# Patient Record
Sex: Male | Born: 1950 | Race: White | Hispanic: No | Smoking: Current every day smoker
Health system: Southern US, Community
[De-identification: ages and names within clinical notes are randomized; demographics above are authoritative.]

## PROBLEM LIST (undated history)

## (undated) DIAGNOSIS — C801 Malignant (primary) neoplasm, unspecified: Secondary | ICD-10-CM

## (undated) HISTORY — PX: OTHER SURGICAL HISTORY: SHX169

## (undated) HISTORY — PX: APPENDECTOMY: SHX54

---

## 2001-09-20 ENCOUNTER — Emergency Department (HOSPITAL_COMMUNITY): Admission: EM | Admit: 2001-09-20 | Discharge: 2001-09-20 | Payer: Self-pay | Admitting: Emergency Medicine

## 2010-12-08 ENCOUNTER — Other Ambulatory Visit: Payer: Self-pay | Admitting: Internal Medicine

## 2010-12-10 ENCOUNTER — Other Ambulatory Visit: Payer: Self-pay | Admitting: Internal Medicine

## 2010-12-10 ENCOUNTER — Ambulatory Visit
Admission: RE | Admit: 2010-12-10 | Discharge: 2010-12-10 | Disposition: A | Discharge status: Home / Self Care | Payer: 59 | Source: Ambulatory Visit | Attending: Internal Medicine | Admitting: Internal Medicine

## 2010-12-10 MED ORDER — IOHEXOL 300 MG/ML  SOLN
100.0000 mL | Freq: Once | INTRAMUSCULAR | Status: AC | PRN
  Administered 2010-12-10: 100 mL via INTRAVENOUS

## 2010-12-16 ENCOUNTER — Other Ambulatory Visit (HOSPITAL_COMMUNITY): Payer: Self-pay | Admitting: Internal Medicine

## 2010-12-27 ENCOUNTER — Other Ambulatory Visit: Payer: Self-pay | Admitting: Radiology

## 2010-12-28 ENCOUNTER — Telehealth: Payer: Self-pay | Admitting: Oncology

## 2010-12-28 ENCOUNTER — Ambulatory Visit (HOSPITAL_COMMUNITY)
Admission: RE | Admit: 2010-12-28 | Discharge: 2010-12-28 | Disposition: A | Discharge status: Home / Self Care | Payer: 59 | Source: Ambulatory Visit | Attending: Internal Medicine | Admitting: Internal Medicine

## 2010-12-28 DIAGNOSIS — C801 Malignant (primary) neoplasm, unspecified: Secondary | ICD-10-CM | POA: Insufficient documentation

## 2010-12-28 DIAGNOSIS — R0989 Other specified symptoms and signs involving the circulatory and respiratory systems: Secondary | ICD-10-CM | POA: Insufficient documentation

## 2010-12-28 DIAGNOSIS — Z01812 Encounter for preprocedural laboratory examination: Secondary | ICD-10-CM | POA: Insufficient documentation

## 2010-12-28 DIAGNOSIS — R0602 Shortness of breath: Secondary | ICD-10-CM | POA: Insufficient documentation

## 2010-12-28 DIAGNOSIS — R0609 Other forms of dyspnea: Secondary | ICD-10-CM | POA: Insufficient documentation

## 2010-12-28 DIAGNOSIS — C786 Secondary malignant neoplasm of retroperitoneum and peritoneum: Secondary | ICD-10-CM | POA: Insufficient documentation

## 2010-12-28 DIAGNOSIS — R599 Enlarged lymph nodes, unspecified: Secondary | ICD-10-CM | POA: Insufficient documentation

## 2010-12-28 LAB — CBC
HCT: 43.5 % (ref 39.0–52.0)
Hemoglobin: 14.7 g/dL (ref 13.0–17.0)
MCH: 31.7 pg (ref 26.0–34.0)
MCHC: 33.8 g/dL (ref 30.0–36.0)
RBC: 4.64 MIL/uL (ref 4.22–5.81)

## 2010-12-28 LAB — PROTIME-INR: Prothrombin Time: 13.7 seconds (ref 11.6–15.2)

## 2010-12-28 MED ORDER — MIDAZOLAM HCL 5 MG/5ML IJ SOLN
INTRAMUSCULAR | Status: AC | PRN
  Administered 2010-12-28: 2 mg via INTRAVENOUS

## 2010-12-28 MED ORDER — MIDAZOLAM HCL 2 MG/2ML IJ SOLN
INTRAMUSCULAR | Status: AC
  Filled 2010-12-28: qty 4

## 2010-12-28 MED ORDER — FENTANYL CITRATE 0.05 MG/ML IJ SOLN
INTRAMUSCULAR | Status: AC
  Filled 2010-12-28: qty 4

## 2010-12-28 MED ORDER — SODIUM CHLORIDE 0.9 % IV SOLN
INTRAVENOUS | Status: DC
  Administered 2010-12-28: 20 mL/h via INTRAVENOUS

## 2010-12-28 MED ORDER — FENTANYL CITRATE 0.05 MG/ML IJ SOLN
INTRAMUSCULAR | Status: AC | PRN
  Administered 2010-12-28: 50 ug via INTRAVENOUS

## 2010-12-28 NOTE — Procedures (Signed)
Successful CT fluoro guided RP Mass 18 g core bxs No comp Stable 5 bxs placed in saline

## 2010-12-28 NOTE — Telephone Encounter (Signed)
gv pt appt for 12/3 @ 2 pm w/DM.
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
## 2011-01-09 ENCOUNTER — Other Ambulatory Visit: Payer: Self-pay | Admitting: Oncology

## 2011-01-09 DIAGNOSIS — C6290 Malignant neoplasm of unspecified testis, unspecified whether descended or undescended: Secondary | ICD-10-CM

## 2011-01-10 ENCOUNTER — Other Ambulatory Visit (HOSPITAL_BASED_OUTPATIENT_CLINIC_OR_DEPARTMENT_OTHER): Payer: 59 | Admitting: Lab

## 2011-01-10 ENCOUNTER — Ambulatory Visit: Payer: 59

## 2011-01-10 ENCOUNTER — Other Ambulatory Visit: Payer: Self-pay | Admitting: Oncology

## 2011-01-10 ENCOUNTER — Ambulatory Visit (HOSPITAL_BASED_OUTPATIENT_CLINIC_OR_DEPARTMENT_OTHER): Payer: 59 | Admitting: Oncology

## 2011-01-10 VITALS — BP 103/74 | HR 81 | Temp 98.1°F | Ht 72.0 in | Wt 180.7 lb

## 2011-01-10 DIAGNOSIS — C772 Secondary and unspecified malignant neoplasm of intra-abdominal lymph nodes: Secondary | ICD-10-CM

## 2011-01-10 DIAGNOSIS — C6290 Malignant neoplasm of unspecified testis, unspecified whether descended or undescended: Secondary | ICD-10-CM

## 2011-01-10 DIAGNOSIS — R5381 Other malaise: Secondary | ICD-10-CM

## 2011-01-10 LAB — CBC WITH DIFFERENTIAL/PLATELET
Eosinophils Absolute: 0.3 10*3/uL (ref 0.0–0.5)
HCT: 44.5 % (ref 38.4–49.9)
LYMPH%: 10.2 % — ABNORMAL LOW (ref 14.0–49.0)
MONO#: 0.8 10*3/uL (ref 0.1–0.9)
NEUT#: 4.7 10*3/uL (ref 1.5–6.5)
NEUT%: 72.1 % (ref 39.0–75.0)
Platelets: 258 10*3/uL (ref 140–400)
WBC: 6.4 10*3/uL (ref 4.0–10.3)

## 2011-01-10 NOTE — Progress Notes (Signed)
This office note has been dictated.  #[id number]
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
## 2011-01-12 ENCOUNTER — Telehealth: Payer: Self-pay | Admitting: Internal Medicine

## 2011-01-12 ENCOUNTER — Other Ambulatory Visit: Payer: 59

## 2011-01-12 ENCOUNTER — Encounter: Payer: Self-pay | Admitting: *Deleted

## 2011-01-12 NOTE — Telephone Encounter (Signed)
gve the pt his dec 2012 appt calendar along with the pet scan appt with instructions

## 2011-01-13 ENCOUNTER — Other Ambulatory Visit: Payer: Self-pay | Admitting: Medical Oncology

## 2011-01-13 DIAGNOSIS — C6290 Malignant neoplasm of unspecified testis, unspecified whether descended or undescended: Secondary | ICD-10-CM

## 2011-01-13 LAB — AFP TUMOR MARKER: AFP-Tumor Marker: 3.1 ng/mL (ref 0.0–8.0)

## 2011-01-13 LAB — COMPREHENSIVE METABOLIC PANEL
BUN: 25 mg/dL — ABNORMAL HIGH (ref 6–23)
CO2: 25 mEq/L (ref 19–32)
Calcium: 10 mg/dL (ref 8.4–10.5)
Chloride: 101 mEq/L (ref 96–112)
Creatinine, Ser: 1.15 mg/dL (ref 0.50–1.35)
Glucose, Bld: 83 mg/dL (ref 70–99)

## 2011-01-13 LAB — URIC ACID: Uric Acid, Serum: 6.3 mg/dL (ref 4.0–7.8)

## 2011-01-13 MED ORDER — ALLOPURINOL 300 MG PO TABS: 300.0000 mg | ORAL_TABLET | Freq: Every day | ORAL | Status: DC

## 2011-01-13 NOTE — Telephone Encounter (Signed)
I called pt to let him know that Dr. Arline Asp has prescribed allopurinol for him to take 1 po daily.  He voiced understanding.

## 2011-01-17 ENCOUNTER — Other Ambulatory Visit: Payer: 59 | Admitting: Lab

## 2011-01-17 ENCOUNTER — Ambulatory Visit: Payer: 59 | Admitting: Oncology

## 2011-01-17 ENCOUNTER — Ambulatory Visit (HOSPITAL_BASED_OUTPATIENT_CLINIC_OR_DEPARTMENT_OTHER): Payer: 59

## 2011-01-17 ENCOUNTER — Other Ambulatory Visit: Payer: Self-pay | Admitting: Medical Oncology

## 2011-01-17 ENCOUNTER — Ambulatory Visit (HOSPITAL_BASED_OUTPATIENT_CLINIC_OR_DEPARTMENT_OTHER): Payer: 59 | Admitting: Oncology

## 2011-01-17 VITALS — BP 125/67 | HR 63 | Temp 97.2°F | Ht 72.0 in | Wt 188.2 lb

## 2011-01-17 DIAGNOSIS — C6290 Malignant neoplasm of unspecified testis, unspecified whether descended or undescended: Secondary | ICD-10-CM

## 2011-01-17 DIAGNOSIS — Z5111 Encounter for antineoplastic chemotherapy: Secondary | ICD-10-CM

## 2011-01-17 DIAGNOSIS — M7989 Other specified soft tissue disorders: Secondary | ICD-10-CM

## 2011-01-17 MED ORDER — PROCHLORPERAZINE 25 MG RE SUPP: 25.0000 mg | Freq: Two times a day (BID) | RECTAL | Status: DC | PRN

## 2011-01-17 MED ORDER — POTASSIUM CHLORIDE 2 MEQ/ML IV SOLN
Freq: Once | INTRAVENOUS | Status: AC
  Administered 2011-01-17: 13:00:00 via INTRAVENOUS
  Filled 2011-01-17: qty 10

## 2011-01-17 MED ORDER — SODIUM CHLORIDE 0.9 % IV SOLN: Freq: Once | INTRAVENOUS | Status: DC

## 2011-01-17 MED ORDER — SODIUM CHLORIDE 0.9 % IV SOLN
20.0000 mg/m2 | Freq: Once | INTRAVENOUS | Status: AC
  Administered 2011-01-17: 41 mg via INTRAVENOUS
  Filled 2011-01-17: qty 41

## 2011-01-17 MED ORDER — DEXAMETHASONE SODIUM PHOSPHATE 4 MG/ML IJ SOLN
20.0000 mg | Freq: Once | INTRAMUSCULAR | Status: AC
  Administered 2011-01-17: 20 mg via INTRAVENOUS

## 2011-01-17 MED ORDER — ONDANSETRON 16 MG/50ML IVPB (CHCC)
16.0000 mg | Freq: Once | INTRAVENOUS | Status: AC
  Administered 2011-01-17: 16 mg via INTRAVENOUS

## 2011-01-17 MED ORDER — HYDROCODONE-ACETAMINOPHEN 10-325 MG PO TABS: 1.0000 | ORAL_TABLET | Freq: Four times a day (QID) | ORAL | Status: AC | PRN

## 2011-01-17 MED ORDER — PROCHLORPERAZINE MALEATE 10 MG PO TABS: 10.0000 mg | ORAL_TABLET | Freq: Four times a day (QID) | ORAL | Status: DC | PRN

## 2011-01-17 MED ORDER — SODIUM CHLORIDE 0.9 % IV SOLN
100.0000 mg/m2 | Freq: Once | INTRAVENOUS | Status: AC
  Administered 2011-01-17: 200 mg via INTRAVENOUS
  Filled 2011-01-17: qty 10

## 2011-01-17 MED ORDER — ONDANSETRON HCL 8 MG PO TABS
8.0000 mg | ORAL_TABLET | Freq: Three times a day (TID) | ORAL | Status: AC | PRN
Start: 2011-01-17 — End: 2011-01-24
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
## 2011-01-17 NOTE — Progress Notes (Signed)
Instructed to call for any concerns -pt voices understanding 

## 2011-01-17 NOTE — Progress Notes (Signed)
This office note has been dictated.  #[id number]

## 2011-01-17 NOTE — Progress Notes (Signed)
At the following times, the patient was able to void the following amounts: 1245 is 250 mls. 1320 is 300 mls 1445 is 300 mls.

## 2011-01-17 NOTE — Progress Notes (Signed)
At 1625, pt was able to void 400 mls of yellow urine.

## 2011-01-17 NOTE — Progress Notes (Signed)
At the following times, the patient was able to void the following amount: 1520 is 300 mls 1600 is 300 mls

## 2011-01-18 ENCOUNTER — Other Ambulatory Visit: Payer: Self-pay | Admitting: Oncology

## 2011-01-18 ENCOUNTER — Other Ambulatory Visit: Payer: Self-pay | Admitting: Medical Oncology

## 2011-01-18 ENCOUNTER — Ambulatory Visit (HOSPITAL_COMMUNITY)
Admission: RE | Admit: 2011-01-18 | Discharge: 2011-01-18 | Disposition: A | Discharge status: Home / Self Care | Payer: 59 | Source: Ambulatory Visit | Attending: Oncology | Admitting: Oncology

## 2011-01-18 ENCOUNTER — Encounter (HOSPITAL_COMMUNITY)
Admission: RE | Admit: 2011-01-18 | Discharge: 2011-01-18 | Disposition: A | Discharge status: Home / Self Care | Payer: 59 | Source: Ambulatory Visit | Attending: Oncology | Admitting: Oncology

## 2011-01-18 ENCOUNTER — Ambulatory Visit (HOSPITAL_BASED_OUTPATIENT_CLINIC_OR_DEPARTMENT_OTHER): Payer: 59

## 2011-01-18 ENCOUNTER — Other Ambulatory Visit: Payer: Self-pay | Admitting: Certified Registered Nurse Anesthetist

## 2011-01-18 VITALS — BP 114/76 | HR 59 | Temp 97.9°F

## 2011-01-18 DIAGNOSIS — C6290 Malignant neoplasm of unspecified testis, unspecified whether descended or undescended: Secondary | ICD-10-CM

## 2011-01-18 DIAGNOSIS — I82409 Acute embolism and thrombosis of unspecified deep veins of unspecified lower extremity: Secondary | ICD-10-CM

## 2011-01-18 DIAGNOSIS — I998 Other disorder of circulatory system: Secondary | ICD-10-CM | POA: Insufficient documentation

## 2011-01-18 DIAGNOSIS — R599 Enlarged lymph nodes, unspecified: Secondary | ICD-10-CM | POA: Insufficient documentation

## 2011-01-18 DIAGNOSIS — M79609 Pain in unspecified limb: Secondary | ICD-10-CM | POA: Insufficient documentation

## 2011-01-18 DIAGNOSIS — I82401 Acute embolism and thrombosis of unspecified deep veins of right lower extremity: Secondary | ICD-10-CM | POA: Insufficient documentation

## 2011-01-18 DIAGNOSIS — Z5111 Encounter for antineoplastic chemotherapy: Secondary | ICD-10-CM

## 2011-01-18 DIAGNOSIS — R609 Edema, unspecified: Secondary | ICD-10-CM

## 2011-01-18 DIAGNOSIS — M7989 Other specified soft tissue disorders: Secondary | ICD-10-CM | POA: Insufficient documentation

## 2011-01-18 LAB — GLUCOSE, CAPILLARY: Glucose-Capillary: 129 mg/dL — ABNORMAL HIGH (ref 70–99)

## 2011-01-18 MED ORDER — ENOXAPARIN SODIUM 120 MG/0.8ML ~~LOC~~ SOLN: 120.0000 mg | SUBCUTANEOUS | Status: DC

## 2011-01-18 MED ORDER — SODIUM CHLORIDE 0.9 % IV SOLN
100.0000 mg/m2 | Freq: Once | INTRAVENOUS | Status: AC
  Administered 2011-01-18: 200 mg via INTRAVENOUS
  Filled 2011-01-18: qty 10

## 2011-01-18 MED ORDER — SODIUM CHLORIDE 0.9 % IV SOLN
20.0000 mg/m2 | Freq: Once | INTRAVENOUS | Status: AC
  Administered 2011-01-18: 41 mg via INTRAVENOUS
  Filled 2011-01-18: qty 41

## 2011-01-18 MED ORDER — FLUDEOXYGLUCOSE F - 18 (FDG) INJECTION
18.0000 | Freq: Once | INTRAVENOUS | Status: AC | PRN
  Administered 2011-01-18: 18 via INTRAVENOUS

## 2011-01-18 MED ORDER — ONDANSETRON 16 MG/50ML IVPB (CHCC)
16.0000 mg | Freq: Once | INTRAVENOUS | Status: AC
  Administered 2011-01-18: 16 mg via INTRAVENOUS

## 2011-01-18 MED ORDER — DEXAMETHASONE SODIUM PHOSPHATE 4 MG/ML IJ SOLN
20.0000 mg | Freq: Once | INTRAMUSCULAR | Status: AC
  Administered 2011-01-18: 20 mg via INTRAVENOUS

## 2011-01-18 MED ORDER — ENOXAPARIN SODIUM 120 MG/0.8ML ~~LOC~~ SOLN
120.0000 mg | Freq: Once | SUBCUTANEOUS | Status: AC
  Administered 2011-01-18: 120 mg via SUBCUTANEOUS

## 2011-01-18 MED ORDER — SODIUM CHLORIDE 0.9 % IV SOLN
Freq: Once | INTRAVENOUS | Status: AC
  Administered 2011-01-18: 10:00:00 via INTRAVENOUS

## 2011-01-18 MED ORDER — POTASSIUM CHLORIDE 2 MEQ/ML IV SOLN
Freq: Once | INTRAVENOUS | Status: AC
  Administered 2011-01-18: 10:00:00 via INTRAVENOUS
  Filled 2011-01-18: qty 10

## 2011-01-18 NOTE — Patient Instructions (Signed)
Oral hydration instructions reviewed, pt verbalized understanding. Pt discharged with father, understands to report to Vascular Lab at [facility] for study of RLE. RLE remains edematous. Prior to discharge pt reported pain was 0/10 unless he "mashes on it". Ambulating with cane.

## 2011-01-18 NOTE — Progress Notes (Signed)
Addended by: Nancy Nordmann on: 01/18/2011 05:43 PM   Modules accepted: Orders

## 2011-01-19 ENCOUNTER — Ambulatory Visit (HOSPITAL_BASED_OUTPATIENT_CLINIC_OR_DEPARTMENT_OTHER): Payer: 59

## 2011-01-19 VITALS — BP 116/78 | HR 56 | Temp 97.6°F

## 2011-01-19 DIAGNOSIS — C6290 Malignant neoplasm of unspecified testis, unspecified whether descended or undescended: Secondary | ICD-10-CM

## 2011-01-19 DIAGNOSIS — Z5111 Encounter for antineoplastic chemotherapy: Secondary | ICD-10-CM

## 2011-01-19 MED ORDER — DEXAMETHASONE SODIUM PHOSPHATE 4 MG/ML IJ SOLN
20.0000 mg | Freq: Once | INTRAMUSCULAR | Status: AC
  Administered 2011-01-19: 20 mg via INTRAVENOUS

## 2011-01-19 MED ORDER — SODIUM CHLORIDE 0.9 % IV SOLN
20.0000 mg/m2 | Freq: Once | INTRAVENOUS | Status: AC
  Administered 2011-01-19: 41 mg via INTRAVENOUS
  Filled 2011-01-19: qty 41

## 2011-01-19 MED ORDER — SODIUM CHLORIDE 0.9 % IV SOLN
Freq: Once | INTRAVENOUS | Status: AC
  Administered 2011-01-19: 09:00:00 via INTRAVENOUS

## 2011-01-19 MED ORDER — SODIUM CHLORIDE 0.9 % IV SOLN
100.0000 mg/m2 | Freq: Once | INTRAVENOUS | Status: AC
  Administered 2011-01-19: 200 mg via INTRAVENOUS
  Filled 2011-01-19: qty 10

## 2011-01-19 MED ORDER — ONDANSETRON 16 MG/50ML IVPB (CHCC)
16.0000 mg | Freq: Once | INTRAVENOUS | Status: AC
  Administered 2011-01-19: 16 mg via INTRAVENOUS

## 2011-01-19 MED ORDER — POTASSIUM CHLORIDE 2 MEQ/ML IV SOLN
Freq: Once | INTRAVENOUS | Status: AC
  Administered 2011-01-19: 09:00:00 via INTRAVENOUS
  Filled 2011-01-19: qty 10

## 2011-01-19 NOTE — Progress Notes (Signed)
*  PRELIMINARY RESULTS*  Bilateral lower extremity venous duplex  has been performed.  DVT of the right leg from popliteal vein up through the common femoral vein as high as I can visualize.  Dr. Mamie Levers RN notified.  Patient to return to office.  Dennis [name] 01/19/2011, 1:58 PM

## 2011-01-20 ENCOUNTER — Ambulatory Visit (HOSPITAL_BASED_OUTPATIENT_CLINIC_OR_DEPARTMENT_OTHER): Payer: 59

## 2011-01-20 VITALS — BP 110/64 | HR 49 | Temp 97.5°F

## 2011-01-20 DIAGNOSIS — Z5111 Encounter for antineoplastic chemotherapy: Secondary | ICD-10-CM

## 2011-01-20 DIAGNOSIS — C6290 Malignant neoplasm of unspecified testis, unspecified whether descended or undescended: Secondary | ICD-10-CM

## 2011-01-20 MED ORDER — ONDANSETRON 16 MG/50ML IVPB (CHCC)
16.0000 mg | Freq: Once | INTRAVENOUS | Status: AC
  Administered 2011-01-20: 16 mg via INTRAVENOUS

## 2011-01-20 MED ORDER — DEXAMETHASONE SODIUM PHOSPHATE 4 MG/ML IJ SOLN
20.0000 mg | Freq: Once | INTRAMUSCULAR | Status: AC
  Administered 2011-01-20: 20 mg via INTRAVENOUS

## 2011-01-20 MED ORDER — POTASSIUM CHLORIDE 2 MEQ/ML IV SOLN
Freq: Once | INTRAVENOUS | Status: AC
  Administered 2011-01-20: 09:00:00 via INTRAVENOUS
  Filled 2011-01-20: qty 10

## 2011-01-20 MED ORDER — SODIUM CHLORIDE 0.9 % IV SOLN
Freq: Once | INTRAVENOUS | Status: AC
  Administered 2011-01-20: 09:00:00 via INTRAVENOUS

## 2011-01-20 MED ORDER — SODIUM CHLORIDE 0.9 % IV SOLN
100.0000 mg/m2 | Freq: Once | INTRAVENOUS | Status: AC
  Administered 2011-01-20: 200 mg via INTRAVENOUS
  Filled 2011-01-20: qty 10

## 2011-01-20 MED ORDER — CISPLATIN CHEMO INJECTION 100MG/100ML
20.0000 mg/m2 | Freq: Once | INTRAVENOUS | Status: AC
  Administered 2011-01-20: 41 mg via INTRAVENOUS
  Filled 2011-01-20: qty 41

## 2011-01-21 ENCOUNTER — Ambulatory Visit (HOSPITAL_BASED_OUTPATIENT_CLINIC_OR_DEPARTMENT_OTHER): Payer: 59

## 2011-01-21 ENCOUNTER — Other Ambulatory Visit (HOSPITAL_BASED_OUTPATIENT_CLINIC_OR_DEPARTMENT_OTHER): Payer: 59

## 2011-01-21 VITALS — BP 119/76 | HR 48 | Temp 97.6°F

## 2011-01-21 DIAGNOSIS — C6290 Malignant neoplasm of unspecified testis, unspecified whether descended or undescended: Secondary | ICD-10-CM

## 2011-01-21 DIAGNOSIS — Z5111 Encounter for antineoplastic chemotherapy: Secondary | ICD-10-CM

## 2011-01-21 DIAGNOSIS — M7989 Other specified soft tissue disorders: Secondary | ICD-10-CM

## 2011-01-21 LAB — CBC WITH DIFFERENTIAL/PLATELET
Basophils Absolute: 0 10*3/uL (ref 0.0–0.1)
EOS%: 0 % (ref 0.0–7.0)
HCT: 34.9 % — ABNORMAL LOW (ref 38.4–49.9)
HGB: 12.2 g/dL — ABNORMAL LOW (ref 13.0–17.1)
MONO#: 0 10*3/uL — ABNORMAL LOW (ref 0.1–0.9)
NEUT#: 9.4 10*3/uL — ABNORMAL HIGH (ref 1.5–6.5)
NEUT%: 98.1 % — ABNORMAL HIGH (ref 39.0–75.0)
RDW: 12.7 % (ref 11.0–14.6)
WBC: 9.6 10*3/uL (ref 4.0–10.3)
lymph#: 0.2 10*3/uL — ABNORMAL LOW (ref 0.9–3.3)

## 2011-01-21 LAB — BASIC METABOLIC PANEL
Calcium: 8.4 mg/dL (ref 8.4–10.5)
Creatinine, Ser: 0.97 mg/dL (ref 0.50–1.35)
Sodium: 128 mEq/L — ABNORMAL LOW (ref 135–145)

## 2011-01-21 MED ORDER — SODIUM CHLORIDE 0.9 % IV SOLN
100.0000 mg/m2 | Freq: Once | INTRAVENOUS | Status: AC
  Administered 2011-01-21: 200 mg via INTRAVENOUS
  Filled 2011-01-21: qty 10

## 2011-01-21 MED ORDER — SODIUM CHLORIDE 0.9 % IV SOLN
Freq: Once | INTRAVENOUS | Status: AC
  Administered 2011-01-21: 10:00:00 via INTRAVENOUS

## 2011-01-21 MED ORDER — POTASSIUM CHLORIDE 2 MEQ/ML IV SOLN
Freq: Once | INTRAVENOUS | Status: AC
  Administered 2011-01-21: 10:00:00 via INTRAVENOUS
  Filled 2011-01-21: qty 10

## 2011-01-21 MED ORDER — DEXAMETHASONE SODIUM PHOSPHATE 4 MG/ML IJ SOLN
20.0000 mg | Freq: Once | INTRAMUSCULAR | Status: AC
  Administered 2011-01-21: 20 mg via INTRAVENOUS

## 2011-01-21 MED ORDER — ONDANSETRON 16 MG/50ML IVPB (CHCC)
16.0000 mg | Freq: Once | INTRAVENOUS | Status: AC
  Administered 2011-01-21: 16 mg via INTRAVENOUS

## 2011-01-21 MED ORDER — SODIUM CHLORIDE 0.9 % IV SOLN
20.0000 mg/m2 | Freq: Once | INTRAVENOUS | Status: AC
  Administered 2011-01-21: 41 mg via INTRAVENOUS
  Filled 2011-01-21: qty 41

## 2011-01-22 ENCOUNTER — Ambulatory Visit (HOSPITAL_BASED_OUTPATIENT_CLINIC_OR_DEPARTMENT_OTHER): Payer: 59

## 2011-01-22 DIAGNOSIS — M7989 Other specified soft tissue disorders: Secondary | ICD-10-CM

## 2011-01-22 DIAGNOSIS — I82409 Acute embolism and thrombosis of unspecified deep veins of unspecified lower extremity: Secondary | ICD-10-CM

## 2011-01-22 DIAGNOSIS — Z5189 Encounter for other specified aftercare: Secondary | ICD-10-CM

## 2011-01-22 DIAGNOSIS — C6290 Malignant neoplasm of unspecified testis, unspecified whether descended or undescended: Secondary | ICD-10-CM

## 2011-01-22 MED ORDER — ENOXAPARIN SODIUM 120 MG/0.8ML ~~LOC~~ SOLN
120.0000 mg | Freq: Once | SUBCUTANEOUS | Status: AC
  Administered 2011-01-22: 12:00:00 via SUBCUTANEOUS

## 2011-01-22 MED ORDER — PEGFILGRASTIM INJECTION 6 MG/0.6ML
6.0000 mg | Freq: Once | SUBCUTANEOUS | Status: AC
  Administered 2011-01-22: 6 mg via SUBCUTANEOUS

## 2011-01-24 ENCOUNTER — Other Ambulatory Visit (HOSPITAL_BASED_OUTPATIENT_CLINIC_OR_DEPARTMENT_OTHER): Payer: 59 | Admitting: Lab

## 2011-01-24 DIAGNOSIS — C6290 Malignant neoplasm of unspecified testis, unspecified whether descended or undescended: Secondary | ICD-10-CM

## 2011-01-24 DIAGNOSIS — M7989 Other specified soft tissue disorders: Secondary | ICD-10-CM

## 2011-01-24 LAB — CBC WITH DIFFERENTIAL/PLATELET
BASO%: 0 % (ref 0.0–2.0)
Eosinophils Absolute: 0 10*3/uL (ref 0.0–0.5)
MCHC: 34.3 g/dL (ref 32.0–36.0)
MONO#: 0 10*3/uL — ABNORMAL LOW (ref 0.1–0.9)
NEUT#: 7.4 10*3/uL — ABNORMAL HIGH (ref 1.5–6.5)
Platelets: 215 10*3/uL (ref 140–400)
RBC: 4.33 10*6/uL (ref 4.20–5.82)
WBC: 7.5 10*3/uL (ref 4.0–10.3)
lymph#: 0.1 10*3/uL — ABNORMAL LOW (ref 0.9–3.3)

## 2011-01-24 LAB — BASIC METABOLIC PANEL
CO2: 25 mEq/L (ref 19–32)
Chloride: 91 mEq/L — ABNORMAL LOW (ref 96–112)
Glucose, Bld: 80 mg/dL (ref 70–99)
Potassium: 4.1 mEq/L (ref 3.5–5.3)
Sodium: 127 mEq/L — ABNORMAL LOW (ref 135–145)

## 2011-01-25 ENCOUNTER — Other Ambulatory Visit: Payer: Self-pay

## 2011-01-25 ENCOUNTER — Telehealth: Payer: Self-pay

## 2011-01-25 DIAGNOSIS — C6290 Malignant neoplasm of unspecified testis, unspecified whether descended or undescended: Secondary | ICD-10-CM

## 2011-01-25 NOTE — Telephone Encounter (Signed)
Mother called pt been not eating and not keeping anything down since Sat. Able to drink water and gatorade but not 64 ounces daily. Now pt had diarrhea. Asking can he take immodium?  S/W Dr Arline Asp and schedule pt for lab and 1 liter fluid 12/19. Mother made aware: OK to take immodium and of schedule time of 2pm for lab and 215 for infusion. She expressed understanding.

## 2011-01-26 ENCOUNTER — Inpatient Hospital Stay (HOSPITAL_COMMUNITY)
Admission: EM | Admit: 2011-01-26 | Discharge: 2011-02-07 | DRG: 808 | Disposition: A | Discharge status: Home / Self Care | Payer: 59 | Attending: Internal Medicine | Admitting: Internal Medicine

## 2011-01-26 ENCOUNTER — Encounter: Payer: Self-pay | Admitting: Emergency Medicine

## 2011-01-26 ENCOUNTER — Emergency Department (HOSPITAL_COMMUNITY): Payer: 59

## 2011-01-26 ENCOUNTER — Other Ambulatory Visit: Payer: Self-pay

## 2011-01-26 ENCOUNTER — Other Ambulatory Visit (HOSPITAL_BASED_OUTPATIENT_CLINIC_OR_DEPARTMENT_OTHER): Payer: 59

## 2011-01-26 ENCOUNTER — Ambulatory Visit: Payer: 59

## 2011-01-26 ENCOUNTER — Other Ambulatory Visit: Payer: Self-pay | Admitting: Nurse Practitioner

## 2011-01-26 DIAGNOSIS — T451X5A Adverse effect of antineoplastic and immunosuppressive drugs, initial encounter: Secondary | ICD-10-CM | POA: Diagnosis present

## 2011-01-26 DIAGNOSIS — D709 Neutropenia, unspecified: Secondary | ICD-10-CM

## 2011-01-26 DIAGNOSIS — F17200 Nicotine dependence, unspecified, uncomplicated: Secondary | ICD-10-CM | POA: Diagnosis present

## 2011-01-26 DIAGNOSIS — C779 Secondary and unspecified malignant neoplasm of lymph node, unspecified: Secondary | ICD-10-CM | POA: Diagnosis present

## 2011-01-26 DIAGNOSIS — J189 Pneumonia, unspecified organism: Secondary | ICD-10-CM | POA: Diagnosis present

## 2011-01-26 DIAGNOSIS — N179 Acute kidney failure, unspecified: Secondary | ICD-10-CM | POA: Diagnosis present

## 2011-01-26 DIAGNOSIS — Z9221 Personal history of antineoplastic chemotherapy: Secondary | ICD-10-CM

## 2011-01-26 DIAGNOSIS — R5381 Other malaise: Secondary | ICD-10-CM | POA: Diagnosis present

## 2011-01-26 DIAGNOSIS — R6510 Systemic inflammatory response syndrome (SIRS) of non-infectious origin without acute organ dysfunction: Secondary | ICD-10-CM | POA: Diagnosis present

## 2011-01-26 DIAGNOSIS — E876 Hypokalemia: Secondary | ICD-10-CM | POA: Diagnosis present

## 2011-01-26 DIAGNOSIS — T380X5A Adverse effect of glucocorticoids and synthetic analogues, initial encounter: Secondary | ICD-10-CM | POA: Diagnosis not present

## 2011-01-26 DIAGNOSIS — E871 Hypo-osmolality and hyponatremia: Secondary | ICD-10-CM

## 2011-01-26 DIAGNOSIS — D696 Thrombocytopenia, unspecified: Secondary | ICD-10-CM | POA: Diagnosis present

## 2011-01-26 DIAGNOSIS — D72829 Elevated white blood cell count, unspecified: Secondary | ICD-10-CM | POA: Diagnosis not present

## 2011-01-26 DIAGNOSIS — D702 Other drug-induced agranulocytosis: Principal | ICD-10-CM | POA: Diagnosis present

## 2011-01-26 DIAGNOSIS — R0602 Shortness of breath: Secondary | ICD-10-CM | POA: Diagnosis present

## 2011-01-26 DIAGNOSIS — R0902 Hypoxemia: Secondary | ICD-10-CM

## 2011-01-26 DIAGNOSIS — R5081 Fever presenting with conditions classified elsewhere: Secondary | ICD-10-CM | POA: Diagnosis present

## 2011-01-26 DIAGNOSIS — I82409 Acute embolism and thrombosis of unspecified deep veins of unspecified lower extremity: Secondary | ICD-10-CM

## 2011-01-26 DIAGNOSIS — B370 Candidal stomatitis: Secondary | ICD-10-CM

## 2011-01-26 DIAGNOSIS — R110 Nausea: Secondary | ICD-10-CM | POA: Diagnosis present

## 2011-01-26 DIAGNOSIS — C50919 Malignant neoplasm of unspecified site of unspecified female breast: Secondary | ICD-10-CM | POA: Diagnosis present

## 2011-01-26 DIAGNOSIS — C6290 Malignant neoplasm of unspecified testis, unspecified whether descended or undescended: Secondary | ICD-10-CM

## 2011-01-26 DIAGNOSIS — R197 Diarrhea, unspecified: Secondary | ICD-10-CM | POA: Diagnosis present

## 2011-01-26 DIAGNOSIS — I82401 Acute embolism and thrombosis of unspecified deep veins of right lower extremity: Secondary | ICD-10-CM | POA: Diagnosis present

## 2011-01-26 HISTORY — DX: Malignant (primary) neoplasm, unspecified: C80.1

## 2011-01-26 LAB — BLOOD GAS, ARTERIAL
Acid-base deficit: 1.1 mmol/L (ref 0.0–2.0)
Bicarbonate: 20.4 meq/L (ref 20.0–24.0)
Drawn by: 347861
O2 Content: 2 L/min
O2 Saturation: 91.6 %
Patient temperature: 98.6
TCO2: 18.3 mmol/L (ref 0–100)
pCO2 arterial: 25.2 mmHg — ABNORMAL LOW (ref 35.0–45.0)
pH, Arterial: 7.52 — ABNORMAL HIGH (ref 7.350–7.450)
pO2, Arterial: 57.7 mmHg — ABNORMAL LOW (ref 80.0–100.0)

## 2011-01-26 LAB — DIFFERENTIAL
Basophils Absolute: 0 10*3/uL (ref 0.0–0.1)
Basophils Relative: 0 % (ref 0–1)
Eosinophils Absolute: 0 10*3/uL (ref 0.0–0.7)
Eosinophils Relative: 0 % (ref 0–5)
Lymphocytes Relative: 0 % — ABNORMAL LOW (ref 12–46)
Lymphs Abs: 0 10*3/uL — ABNORMAL LOW (ref 0.7–4.0)
Neutro Abs: 0 10*3/uL — ABNORMAL LOW (ref 1.7–7.7)
Neutrophils Relative %: 0 % — ABNORMAL LOW (ref 43–77)

## 2011-01-26 LAB — COMPREHENSIVE METABOLIC PANEL WITH GFR
ALT: 12 U/L (ref 0–53)
AST: 14 U/L (ref 0–37)
Albumin: 2.5 g/dL — ABNORMAL LOW (ref 3.5–5.2)
Alkaline Phosphatase: 68 U/L (ref 39–117)
BUN: 23 mg/dL (ref 6–23)
CO2: 21 meq/L (ref 19–32)
Calcium: 7.6 mg/dL — ABNORMAL LOW (ref 8.4–10.5)
Chloride: 83 meq/L — ABNORMAL LOW (ref 96–112)
Creatinine, Ser: 1.41 mg/dL — ABNORMAL HIGH (ref 0.50–1.35)
GFR calc Af Amer: 61 mL/min — ABNORMAL LOW
GFR calc non Af Amer: 53 mL/min — ABNORMAL LOW
Glucose, Bld: 78 mg/dL (ref 70–99)
Potassium: 3.6 meq/L (ref 3.5–5.1)
Sodium: 114 meq/L — CL (ref 135–145)
Total Bilirubin: 1.3 mg/dL — ABNORMAL HIGH (ref 0.3–1.2)
Total Protein: 5.9 g/dL — ABNORMAL LOW (ref 6.0–8.3)

## 2011-01-26 LAB — CBC
HCT: 30.6 % — ABNORMAL LOW (ref 39.0–52.0)
Hemoglobin: 11.3 g/dL — ABNORMAL LOW (ref 13.0–17.0)
MCH: 31.1 pg (ref 26.0–34.0)
MCHC: 36.9 g/dL — ABNORMAL HIGH (ref 30.0–36.0)
MCV: 84.3 fL (ref 78.0–100.0)
Platelets: 62 K/uL — ABNORMAL LOW (ref 150–400)
RBC: 3.63 MIL/uL — ABNORMAL LOW (ref 4.22–5.81)
RDW: 12.2 % (ref 11.5–15.5)
WBC: 0.1 K/uL — CL (ref 4.0–10.5)

## 2011-01-26 LAB — URINALYSIS, ROUTINE W REFLEX MICROSCOPIC
Bilirubin Urine: NEGATIVE
Ketones, ur: NEGATIVE mg/dL
Leukocytes, UA: NEGATIVE
Nitrite: NEGATIVE
Protein, ur: 100 mg/dL — AB
Urobilinogen, UA: 1 mg/dL (ref 0.0–1.0)

## 2011-01-26 LAB — CBC WITH DIFFERENTIAL/PLATELET
Basophils Absolute: 0 10*3/uL (ref 0.0–0.1)
EOS%: 0.9 % (ref 0.0–7.0)
HCT: 37.5 % — ABNORMAL LOW (ref 38.4–49.9)
HGB: 13.1 g/dL (ref 13.0–17.1)
LYMPH%: 79.9 % — ABNORMAL HIGH (ref 14.0–49.0)
MCH: 31.7 pg (ref 27.2–33.4)
MCHC: 34.9 g/dL (ref 32.0–36.0)
MCV: 90.8 fL (ref 79.3–98.0)
MONO%: 6.1 % (ref 0.0–14.0)
NEUT%: 13.1 % — ABNORMAL LOW (ref 39.0–75.0)
Platelets: 82 10*3/uL — ABNORMAL LOW (ref 140–400)
lymph#: 0.1 10*3/uL — ABNORMAL LOW (ref 0.9–3.3)

## 2011-01-26 LAB — BASIC METABOLIC PANEL
BUN: 23 mg/dL (ref 6–23)
Calcium: 8.5 mg/dL (ref 8.4–10.5)
Chloride: 85 mEq/L — ABNORMAL LOW (ref 96–112)
Creatinine, Ser: 1.56 mg/dL — ABNORMAL HIGH (ref 0.50–1.35)

## 2011-01-26 LAB — CARDIAC PANEL(CRET KIN+CKTOT+MB+TROPI)
CK, MB: 2 ng/mL (ref 0.3–4.0)
Relative Index: INVALID (ref 0.0–2.5)
Total CK: 58 U/L (ref 7–232)

## 2011-01-26 LAB — URINE MICROSCOPIC-ADD ON

## 2011-01-26 LAB — PRO B NATRIURETIC PEPTIDE: Pro B Natriuretic peptide (BNP): 573.6 pg/mL — ABNORMAL HIGH (ref 0–125)

## 2011-01-26 MED ORDER — ONDANSETRON HCL 4 MG/2ML IJ SOLN
4.0000 mg | Freq: Once | INTRAMUSCULAR | Status: DC
  Filled 2011-01-26: qty 2

## 2011-01-26 MED ORDER — SODIUM CHLORIDE 0.9 % IV SOLN
INTRAVENOUS | Status: DC
  Administered 2011-01-26: 22:00:00 via INTRAVENOUS

## 2011-01-26 MED ORDER — SODIUM CHLORIDE 0.9 % IV BOLUS (SEPSIS)
1000.0000 mL | Freq: Once | INTRAVENOUS | Status: AC
  Administered 2011-01-26: 1000 mL via INTRAVENOUS

## 2011-01-26 MED ORDER — IOHEXOL 300 MG/ML  SOLN
100.0000 mL | Freq: Once | INTRAMUSCULAR | Status: AC | PRN
  Administered 2011-01-26: 90 mL via INTRAVENOUS

## 2011-01-26 MED ORDER — OXYCODONE HCL 5 MG PO TABS
5.0000 mg | ORAL_TABLET | ORAL | Status: DC | PRN
  Administered 2011-01-30 – 2011-02-04 (×5): 5 mg via ORAL
  Filled 2011-01-26 (×5): qty 1

## 2011-01-26 MED ORDER — PIPERACILLIN-TAZOBACTAM 3.375 G IVPB
3.3750 g | Freq: Three times a day (TID) | INTRAVENOUS | Status: DC
  Administered 2011-01-27 – 2011-02-06 (×31): 3.375 g via INTRAVENOUS
  Filled 2011-01-26 (×38): qty 50

## 2011-01-26 MED ORDER — PIPERACILLIN-TAZOBACTAM IN DEX 2-0.25 GM/50ML IV SOLN: 2.2500 g | Freq: Three times a day (TID) | INTRAVENOUS | Status: DC

## 2011-01-26 MED ORDER — ALLOPURINOL 300 MG PO TABS
300.0000 mg | ORAL_TABLET | Freq: Every day | ORAL | Status: DC
  Administered 2011-01-26 – 2011-01-28 (×3): 300 mg via ORAL
  Filled 2011-01-26 (×3): qty 1

## 2011-01-26 MED ORDER — PROCHLORPERAZINE 25 MG RE SUPP
25.0000 mg | Freq: Two times a day (BID) | RECTAL | Status: DC | PRN
  Filled 2011-01-26: qty 1

## 2011-01-26 MED ORDER — PROCHLORPERAZINE MALEATE 10 MG PO TABS
10.0000 mg | ORAL_TABLET | Freq: Four times a day (QID) | ORAL | Status: DC | PRN
  Administered 2011-02-04: 10 mg via ORAL
  Filled 2011-01-26 (×2): qty 1

## 2011-01-26 MED ORDER — ENOXAPARIN SODIUM 120 MG/0.8ML ~~LOC~~ SOLN
120.0000 mg | SUBCUTANEOUS | Status: DC
  Administered 2011-01-26 – 2011-01-28 (×3): 120 mg via SUBCUTANEOUS
  Filled 2011-01-26 (×4): qty 0.8

## 2011-01-26 MED ORDER — MORPHINE SULFATE 2 MG/ML IJ SOLN
1.0000 mg | INTRAMUSCULAR | Status: DC | PRN
  Administered 2011-01-30: 1 mg via INTRAVENOUS
  Filled 2011-01-26: qty 1

## 2011-01-26 MED ORDER — VANCOMYCIN HCL IN DEXTROSE 1-5 GM/200ML-% IV SOLN
1000.0000 mg | INTRAVENOUS | Status: AC
  Administered 2011-01-26: 1000 mg via INTRAVENOUS
  Filled 2011-01-26: qty 200

## 2011-01-26 MED ORDER — VANCOMYCIN HCL IN DEXTROSE 1-5 GM/200ML-% IV SOLN: 1000.0000 mg | Freq: Two times a day (BID) | INTRAVENOUS | Status: DC

## 2011-01-26 MED ORDER — PIPERACILLIN-TAZOBACTAM 4.5 G IVPB
4.5000 g | INTRAVENOUS | Status: AC
  Administered 2011-01-26: 4.5 g via INTRAVENOUS
  Filled 2011-01-26: qty 100

## 2011-01-26 MED ORDER — VANCOMYCIN HCL IN DEXTROSE 1-5 GM/200ML-% IV SOLN
1000.0000 mg | Freq: Two times a day (BID) | INTRAVENOUS | Status: DC
  Administered 2011-01-27 – 2011-01-29 (×4): 1000 mg via INTRAVENOUS
  Filled 2011-01-26 (×9): qty 200

## 2011-01-26 MED ORDER — ALBUTEROL SULFATE (5 MG/ML) 0.5% IN NEBU
2.5000 mg | INHALATION_SOLUTION | RESPIRATORY_TRACT | Status: DC | PRN
  Administered 2011-01-27: 2.5 mg via RESPIRATORY_TRACT
  Filled 2011-01-26 (×2): qty 0.5

## 2011-01-26 NOTE — Progress Notes (Signed)
ANTIBIOTIC CONSULT NOTE - INITIAL  Pharmacy Consult for vancomycin/zosyn Indication: HCAP  No Known Allergies  Patient Measurements: Height: 6' 0.05" (183 cm) Weight: 188 lb 4.4 oz (85.4 kg) IBW/kg (Calculated) : 77.71   Vital Signs: Temp: 99.2 F (37.3 C) (12/19 1938) Temp src: Oral (12/19 1857) BP: 119/65 mmHg (12/19 1938) Pulse Rate: 94  (12/19 1938) Intake/Output from previous day:   Intake/Output from this shift:    Labs:  Basename 01/26/11 1616 01/26/11 1359 01/24/11 1127  WBC 0.1* < 0.2* 7.5  HGB 11.3* 13.1 13.9  PLT 62* 82* 215  LABCREA -- -- --  CREATININE 1.41* 1.56* 0.99   Estimated Creatinine Clearance: 61.2 ml/min (by C-G formula based on Cr of 1.41). No results found for this basename: VANCOTROUGH:2,VANCOPEAK:2,VANCORANDOM:2,GENTTROUGH:2,GENTPEAK:2,GENTRANDOM:2,TOBRATROUGH:2,TOBRAPEAK:2,TOBRARND:2,AMIKACINPEAK:2,AMIKACINTROU:2,AMIKACIN:2, in the last 72 hours   Microbiology: Recent Results (from the past 720 hour(s))  TECHNOLOGIST REVIEW     Status: Normal   Collection Time   01/26/11  1:59 PM      Component Value Range Status Comment   Technologist Review Occas Lymph, Rare Seg   Final     Medical History: Past Medical History  Diagnosis Date  . Cancer     Medications:  Scheduled:    . allopurinol  300 mg Oral Daily  . enoxaparin  120 mg Subcutaneous Q24H  . piperacillin-tazobactam (ZOSYN)  IV  2.25 g Intravenous Q8H  . piperacillin-tazobactam (ZOSYN)  IV  4.5 g Intravenous To ER  . sodium chloride  1,000 mL Intravenous Once  . vancomycin  1,000 mg Intravenous To ER  . DISCONTD: ondansetron (ZOFRAN) IV  4 mg Intravenous Once  . DISCONTD: vancomycin  1,000 mg Intravenous Q12H   Assessment: 60 yo male admitted with nausea, SOB found to have HCAP to start broad spectrum antibiotics Vanc and Zosyn. Patient also s/p cycle 2 EP 12/13 and 12/4 for seminoma  Goal of Therapy:  Vancomycin trough level 15-20 mcg/ml  Plan:  1. Vancomycin 1g IV x  1 given in ER at 1733. Will start vancomycin 1g IV q12 thereafter and will check a trough at steady state 2. Zosyn 4.5g IV x 1 given in ER at 1849. Will start Zosyn 3.375g IV q8 (extended interval infusion) thereafter for CrCl>20 ml/min   Hessie Knows, PharmD, BCPS 01/26/2011 9:16 PM [phone]

## 2011-01-26 NOTE — ED Provider Notes (Signed)
History     CSN: [id number] Arrival date & time: 01/26/2011  3:08 PM   First MD Initiated Contact with Patient 01/26/11 1539      Chief Complaint  Patient presents with  . Nausea  . Shortness of Breath    HPI  60yoM ho metastatic seminoma s/p chemotherapy (last one week ago) pw multiple complaints. Pt c/o 4 days of constant watery diarrhea, NBNB emesis and nausea. Dec PO intake solids but able to tolerate liquids intermittently. Denies abdominal pain, back pain. Denies chest pain. States that he has experienced SOB x one week. Recently diagnosed with RLE DVT and on lovenox.  Per father, he had lab work done today and was told to come to the ED for further evaluation and w/u of an abnormal lab value. Per review of labs patient with neutropenia, hyponatremia with sodium 119  Pt noted to be hypoxic on arrival  ED Notes, ED Provider Notes from 01/26/11 0000 to 01/26/11 15:38:30       Corlis Hove McVey 01/26/2011 15:35      Pt sent from cancer center for evaluation of nausea, shortness of breath; pt reports nausea and shortness of breath x 3 days; taking nausea meds at home but unable to tolerate anything po; RA O2 sat at 91%, 97% on 2L via [place]; denies pain; last received chemo 3 days ago                 Original note by Corlis Hove McVey at 01/26/2011 15:33         Corlis Hove McVey 01/26/2011 15:33      Pt sent from cancer center for evaluation of nausea, shortness of breath; pt reports nausea and shortness of breath x 3 days; taking nausea meds at home but unable to tolerate anything po; RA O2 sat at 91%, 97% on 2L via [place]; denies pain    Past Medical History  Diagnosis Date  . Cancer     Past Surgical History  Procedure Date  . Appendectomy     No family history on file.  History  Substance Use Topics  . Smoking status: Current Everyday Smoker -- 1.0 packs/day    Types: Cigarettes  . Smokeless tobacco: Not on file  . Alcohol Use: 1.0 oz/week    2 drink(s) per  week      Review of Systems  All other systems reviewed and are negative.   except as noted HPI   Allergies  Review of patient's allergies indicates no known allergies.  Home Medications   Current Outpatient Rx  Name Route Sig Dispense Refill  . ALLOPURINOL 300 MG PO TABS Oral Take 1 tablet (300 mg total) by mouth daily. 30 tablet 3  . ENOXAPARIN SODIUM 120 MG/0.8ML [place] SOLN Subcutaneous Inject 0.8 mLs (120 mg total) into the skin daily. 12.6 mL 3  . HYDROCODONE-ACETAMINOPHEN 10-325 MG PO TABS Oral Take 1 tablet by mouth every 6 (six) hours as needed for pain. 30 tablet 2  . PROCHLORPERAZINE MALEATE 10 MG PO TABS Oral Take 10 mg by mouth every 6 (six) hours as needed. nausea     . PROCHLORPERAZINE 25 MG RE SUPP Rectal Place 25 mg rectally every 12 (twelve) hours as needed. Nausea        BP 119/65  Pulse 94  Temp(Src) 99.2 F (37.3 C) (Oral)  Resp 36  SpO2 95%  Physical Exam  Nursing note and vitals reviewed. Constitutional: He is oriented to person, place, and time. He appears  well-developed and well-nourished. No distress.  HENT:  Head: Atraumatic.  Mouth/Throat: Oropharynx is clear and moist.       Mm dry  Eyes: Conjunctivae are normal. Pupils are equal, round, and reactive to light.  Neck: Neck supple.  Cardiovascular: Normal rate, regular rhythm, normal heart sounds and intact distal pulses.  Exam reveals no gallop and no friction rub.   No murmur heard. Pulmonary/Chest: Effort normal. No respiratory distress. He has no wheezes. He has rales. He exhibits no tenderness.       R lower lung fields  Abdominal: Soft. Bowel sounds are normal. He exhibits no distension. There is no tenderness. There is no rebound and no guarding.  Musculoskeletal: Normal range of motion. He exhibits edema and tenderness.       RLE edema, ttp > LLE  Neurological: He is alert and oriented to person, place, and time.  Skin: Skin is warm and dry. There is pallor.  Psychiatric: He has a  normal mood and affect.    Date: 01/26/2011  Rate: 87  Rhythm: normal sinus rhythm  QRS Axis: normal  Intervals: normal  ST/T Wave abnormalities: normal  Conduction Disutrbances:none  Narrative Interpretation: sinus rhythm  Old EKG Reviewed: none available   ED Course  Procedures (including critical care time)  Labs Reviewed  CBC - Abnormal; Notable for the following:    WBC 0.1 (*)    RBC 3.63 (*)    Hemoglobin 11.3 (*)    HCT 30.6 (*)    MCHC 36.9 (*)    Platelets 62 (*)    All other components within normal limits  DIFFERENTIAL - Abnormal; Notable for the following:    Neutrophils Relative 0 (*)    Lymphocytes Relative 0 (*)    Monocytes Relative 0 (*)    Neutro Abs 0.0 (*)    Lymphs Abs 0.0 (*)    Monocytes Absolute 0.0 (*)    All other components within normal limits  COMPREHENSIVE METABOLIC PANEL - Abnormal; Notable for the following:    Sodium 114 (*)    Chloride 83 (*)    Creatinine, Ser 1.41 (*)    Calcium 7.6 (*)    Total Protein 5.9 (*)    Albumin 2.5 (*)    Total Bilirubin 1.3 (*)    GFR calc non Af Amer 53 (*)    GFR calc Af Amer 61 (*)    All other components within normal limits  PRO B NATRIURETIC PEPTIDE - Abnormal; Notable for the following:    Pro B Natriuretic peptide (BNP) 573.6 (*)    All other components within normal limits  URINALYSIS, ROUTINE W REFLEX MICROSCOPIC - Abnormal; Notable for the following:    Color, Urine AMBER (*) BIOCHEMICALS MAY BE AFFECTED BY COLOR   Specific Gravity, Urine >1.046 (*)    Hgb urine dipstick TRACE (*)    Protein, ur 100 (*)    All other components within normal limits  BLOOD GAS, ARTERIAL - Abnormal; Notable for the following:    pH, Arterial 7.520 (*)    pCO2 arterial 25.2 (*)    pO2, Arterial 57.7 (*)    All other components within normal limits  CARDIAC PANEL(CRET KIN+CKTOT+MB+TROPI)  URINE MICROSCOPIC-ADD ON  URINE CULTURE  CULTURE, BLOOD (ROUTINE X 2)  CULTURE, BLOOD (ROUTINE X 2)   Dg Chest 2  View  01/26/2011  *RADIOLOGY REPORT*  Clinical Data: Shortness of breath  CHEST - 2 VIEW  Comparison: 12/10/2010  Findings: Cardiomediastinal silhouette is stable.  Mild thoracic spine osteopenia.  Hazy airspace disease right base suspicious for infiltrate.  Trace left basilar atelectasis or infiltrate.  No pulmonary edema.  IMPRESSION: Hazy airspace disease right base suspicious for infiltrate.  Trace left basilar atelectasis or infiltrate.  No pulmonary edema.  Original Report Authenticated By: Natasha Mead, M.D.   Ct Angio Chest W/cm &/or Wo Cm  01/26/2011  *RADIOLOGY REPORT*  Clinical Data:  Shortness of breath, recent DVT, rule out pulmonary embolus  CT ANGIOGRAPHY CHEST WITH CONTRAST  Technique:  Multidetector CT imaging of the chest was performed using the standard protocol during bolus administration of intravenous contrast.  Multiplanar CT image reconstructions including MIPs were obtained to evaluate the vascular anatomy.  Contrast: 90mL OMNIPAQUE IOHEXOL 300 MG/ML IV SOLN  Comparison:  12/10/2010  Findings:  Images of the thoracic inlet are unremarkable.  Central airways are patent.  Mild atherosclerotic calcifications of thoracic aorta.  No hilar or mediastinal adenopathy.  Sagittal images of the spine shows mild degenerative changes thoracic spine.  No destructive bony lesions are noted.  Sagittal view of the sternum is unremarkable.  No pulmonary embolus is noted.  Images of the lung parenchyma shows no pulmonary edema.  In axial image 71 there is  small amount of consolidation in the right middle lobe suspicious for infiltrate.  Small amount of triangular- shaped consolidation noted in the left base anteriorly with air bronchogram suspicious for atelectasis or infiltrate.  Mild bronchial thickening noted in the right middle lobe.  The previous retrocrural adenopathy has decreased in size. Measures 3.1 x 2 cm on the prior exam measures 4.5 x 3.1 cm.  Heart size is within normal limits.   Atherosclerotic calcifications of the coronary arteries are noted.  Review of the MIP images confirms the above findings.  IMPRESSION:  1.  No pulmonary embolus. 2.  There is a small amount of consolidation with air bronchogram in the right middle lobe suspicious for infiltrate.  Small amount of atelectasis or infiltrate noted in the left base anteriorly. 3.  No hilar or mediastinal adenopathy. 4.  Decrease in size of retrocrural adenopathy measures 3.1 x 2 cm. On prior exam measures 4.5 x 3.1 cm. 5.  No pulmonary edema.  Original Report Authenticated By: Natasha Mead, M.D.     1. Seminoma   2. Hypotension   3. Hypoxia   4. Hyponatremia   5. Neutropenia   6. HCAP (healthcare-associated pneumonia)      MDM  H/o metastatic ca s/p recent chemotherapy, recent DVT on lovenox pw multiple complaints. I believe the N/V/D is related to his recent chemo, resulting in dehydration and hypotension. However, he does c/o shortness of breath as well with hypoxia on arrival. In light of recent VTE will obtain CT Angio to evaluate for PE. IVF, zofran, labs. Anticipate admission.  HCAP. Vanc and cefepime ordered for now. Blood cultures sent.   Called to bedside for patient with worsening tachypnea and right lower lung field Rales. The patient states that he feels about the same. He is not complaining of worsening shortness of breath. He does state that he would like to be intubated if need be. ABG with acute respiratory alkalosis and  Hypoxia. Continues to remain 95-97% on 2 L nasal cannula. Given hypoxia on ABG will place the patient on Ventimask I discussed his admission with the hospitalist. Admitted to stepdown bed. Will monitor carefully for worsening resp status, possible intubation          Forbes Cellar, MD 01/26/11  1946 

## 2011-01-26 NOTE — Progress Notes (Signed)
Pt here for labs and possible IVF.    WBC  <0.2 , ANC  0.0 ;  VSs  Temp 97.3 , P 97 , BP  84/59.   Per Lora Havens, RN in infusion room  -   Pt c/o not eating, has had chills/hot flashes at home, temp running around 99.1 ;  Pt also c/o 2 episodes of diarrhea today despite taking Imodium, c/o of nausea/vomiting. Pt reported extreme weakness and fatigue.   Pt had received Neulasta injection on 01/22/11 post chemo. Dr. Arline Asp notified.   Order received for pt to go to ER for further evaluation. Explanation given to pt/father in infusion room.  Pt was transported to ER via wheelchair.  Report given to ER triage nurse.

## 2011-01-26 NOTE — ED Notes (Addendum)
Pt sent from cancer center for evaluation of nausea, shortness of breath; pt reports nausea and shortness of breath x 3 days; taking nausea meds at home but unable to tolerate anything po; RA O2 sat at 91%, 97% on 2L via [place]; denies pain; last received chemo 3 days ago

## 2011-01-26 NOTE — ED Notes (Signed)
Patient transported to X-ray 

## 2011-01-26 NOTE — H&P (Signed)
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 Arline Asp and has received chemotherapy 4 days prior to admission, he now presents to [facility] ED with main concern of progressively worsening shortness of breath since receiving chemo associated with fatigue, nausea, vomiting, poor oral intake, diarrhea. He also tells me that approximately 1 week ago he was diagnosed with DVT and has been started on Lovenox injections. He also now reports fever, chills, but no chest pain, no specific urinary concerns, no headaches or visual changes, no neck stiffness, no known sick contacts or exposures.  Allergies: No Known Allergies  Prior to Admission medications   Medication Sig Start Date End Date Taking? Authorizing Provider  allopurinol (ZYLOPRIM) 300 MG tablet Take 1 tablet (300 mg total) by mouth daily. 01/13/11 01/13/12 Yes Gerarda Fraction Murinson, MD  enoxaparin (LOVENOX) 120 MG/0.8ML SOLN Inject 0.8 mLs (120 mg total) into the skin daily. 01/18/11  Yes Samul Dada, MD  HYDROcodone-acetaminophen (NORCO) 10-325 MG per tablet Take 1 tablet by mouth every 6 (six) hours as needed for pain. 01/17/11 01/27/11 Yes Gerarda Fraction Murinson, MD  prochlorperazine (COMPAZINE) 10 MG tablet Take 10 mg by mouth every 6 (six) hours as needed. nausea    Yes Historical Provider, MD  prochlorperazine (COMPAZINE) 25 MG suppository Place 25 mg rectally every 12 (twelve) hours as needed. Nausea     Yes Historical Provider, MD    Past Medical History  Diagnosis Date  . Cancer     Past Surgical History  Procedure Date  . Appendectomy     Social History:  reports that he has been smoking Cigarettes.  He has been smoking about 1 pack per day. He does not have any smokeless tobacco history on file. He reports that he drinks about one ounce of alcohol per week. He reports that he  does not use illicit drugs.  No family history on file.  Review of Systems:  Per HPI  Physical Exam:  Filed Vitals:   01/26/11 1726 01/26/11 1857 01/26/11 1920 01/26/11 1938  BP: 107/58 124/64 119/65 119/65  Pulse: 85 96 94 94  Temp: 98.8 F (37.1 C) 99.8 F (37.7 C)  99.2 F (37.3 C)  TempSrc:  Oral    Resp: 24 36 36   SpO2: 97% 96% 95% 95%   Constitutional: Vital signs reviewed.  Patient is thin white male who is in no acute distress and cooperative with exam. Alert and oriented x3.  Head: Normocephalic and atraumatic Ear: TM normal bilaterally Mouth: no erythema or exudates, dry MM Eyes: PERRL, EOMI, conjunctivae normal, No scleral icterus.  Neck: Supple, Trachea midline normal ROM, No JVD, mass, thyromegaly, or carotid bruit present.  Cardiovascular: RRR, S1 normal, S2 normal, no MRG, pulses symmetric and intact bilaterally Pulmonary/Chest: decreased breath sounds at bases, pt is tachypneic with rate of breating in 20's - 30's, rhonchi throughout Abdominal: Soft. Non-tender, non-distended, bowel sounds are normal, no masses, organomegaly, or guarding present.  Musculoskeletal: No joint deformities, erythema, or stiffness, ROM full and no nontender Ext: right lower extremity edema with tenderness to palpation, no edema noted in left lower extremities, no cyanosis Hematology: no cervical, inginal, or axillary adenopathy.  Neurological: A&O x3, Strenght is normal and symmetric bilaterally, cranial nerve II-XII are grossly intact, no focal motor deficit, sensory intact to light touch  bilaterally.  Psychiatric: Normal mood and affect. speech and behavior is normal. Judgment and thought content normal. Cognition and memory are normal.   Labs on Admission:  Results for orders placed during the hospital encounter of 01/26/11 (from the past 48 hour(s))  CBC     Status: Abnormal   Collection Time   01/26/11  4:16 PM      Component Value Range Comment   WBC 0.1 (*) 4.0 - 10.5  (K/uL)    RBC 3.63 (*) 4.22 - 5.81 (MIL/uL)    Hemoglobin 11.3 (*) 13.0 - 17.0 (g/dL)    HCT 16.1 (*) 09.6 - 52.0 (%)    MCV 84.3  78.0 - 100.0 (fL)    MCH 31.1  26.0 - 34.0 (pg)    MCHC 36.9 (*) 30.0 - 36.0 (g/dL)    RDW 04.5  40.9 - 81.1 (%)    Platelets 62 (*) 150 - 400 (K/uL)   DIFFERENTIAL     Status: Abnormal   Collection Time   01/26/11  4:16 PM      Component Value Range Comment   Neutrophils Relative 0 (*) 43 - 77 (%)    Lymphocytes Relative 0 (*) 12 - 46 (%)    Monocytes Relative 0 (*) 3 - 12 (%)    Eosinophils Relative 0  0 - 5 (%)    Basophils Relative 0  0 - 1 (%)    Band Neutrophils 0  0 - 10 (%)    nRBC 0  0 (/100 WBC)    Neutro Abs 0.0 (*) 1.7 - 7.7 (K/uL)    Lymphs Abs 0.0 (*) 0.7 - 4.0 (K/uL)    Monocytes Absolute 0.0 (*) 0.1 - 1.0 (K/uL)    Eosinophils Absolute 0.0  0.0 - 0.7 (K/uL)    Basophils Absolute 0.0  0.0 - 0.1 (K/uL)    WBC Morphology TOO FEW TO COUNT, SMEAR AVAILABLE FOR REVIEW   RARE LYMPHOCYTES SEEN ON SLIDE  COMPREHENSIVE METABOLIC PANEL     Status: Abnormal   Collection Time   01/26/11  4:16 PM      Component Value Range Comment   Sodium 114 (*) 135 - 145 (mEq/L)    Potassium 3.6  3.5 - 5.1 (mEq/L)    Chloride 83 (*) 96 - 112 (mEq/L)    CO2 21  19 - 32 (mEq/L)    Glucose, Bld 78  70 - 99 (mg/dL)    BUN 23  6 - 23 (mg/dL)    Creatinine, Ser 9.14 (*) 0.50 - 1.35 (mg/dL)    Calcium 7.6 (*) 8.4 - 10.5 (mg/dL)    Total Protein 5.9 (*) 6.0 - 8.3 (g/dL)    Albumin 2.5 (*) 3.5 - 5.2 (g/dL)    AST 14  0 - 37 (U/L)    ALT 12  0 - 53 (U/L)    Alkaline Phosphatase 68  39 - 117 (U/L)    Total Bilirubin 1.3 (*) 0.3 - 1.2 (mg/dL)    GFR calc non Af Amer 53 (*) >90 (mL/min)    GFR calc Af Amer 61 (*) >90 (mL/min)   CARDIAC PANEL(CRET KIN+CKTOT+MB+TROPI)     Status: Normal   Collection Time   01/26/11  4:16 PM      Component Value Range Comment   Total CK 58  7 - 232 (U/L)    CK, MB 2.0  0.3 - 4.0 (ng/mL)    Troponin I <0.30  <0.30 (ng/mL)     Relative Index RELATIVE  INDEX IS INVALID  0.0 - 2.5    PRO B NATRIURETIC PEPTIDE     Status: Abnormal   Collection Time   01/26/11  4:16 PM      Component Value Range Comment   Pro B Natriuretic peptide (BNP) 573.6 (*) 0 - 125 (pg/mL)   URINALYSIS, ROUTINE W REFLEX MICROSCOPIC     Status: Abnormal   Collection Time   01/26/11  6:29 PM      Component Value Range Comment   Color, Urine AMBER (*) YELLOW  BIOCHEMICALS MAY BE AFFECTED BY COLOR   APPearance CLEAR  CLEAR     Specific Gravity, Urine >1.046 (*) 1.005 - 1.030     pH 6.0  5.0 - 8.0     Glucose, UA NEGATIVE  NEGATIVE (mg/dL)    Hgb urine dipstick TRACE (*) NEGATIVE     Bilirubin Urine NEGATIVE  NEGATIVE     Ketones, ur NEGATIVE  NEGATIVE (mg/dL)    Protein, ur 161 (*) NEGATIVE (mg/dL)    Urobilinogen, UA 1.0  0.0 - 1.0 (mg/dL)    Nitrite NEGATIVE  NEGATIVE     Leukocytes, UA NEGATIVE  NEGATIVE    URINE MICROSCOPIC-ADD ON     Status: Normal   Collection Time   01/26/11  6:29 PM      Component Value Range Comment   Squamous Epithelial / LPF RARE  RARE     WBC, UA 0-2  <3 (WBC/hpf)    RBC / HPF 0-2  <3 (RBC/hpf)    Urine-Other MUCOUS PRESENT     BLOOD GAS, ARTERIAL     Status: Abnormal   Collection Time   01/26/11  7:11 PM      Component Value Range Comment   O2 Content 2.0      Delivery systems NASAL CANNULA      pH, Arterial 7.520 (*) 7.350 - 7.450     pCO2 arterial 25.2 (*) 35.0 - 45.0 (mmHg)    pO2, Arterial 57.7 (*) 80.0 - 100.0 (mmHg)    Bicarbonate 20.4  20.0 - 24.0 (mEq/L)    TCO2 18.3  0 - 100 (mmol/L)    Acid-base deficit 1.1  0.0 - 2.0 (mmol/L)    O2 Saturation 91.6      Patient temperature 98.6      Collection site RIGHT RADIAL      Drawn by [id number]      Sample type ARTERIAL DRAW      Allens test (pass/fail) PASS  PASS     Radiological Exams on Admission:  01/26/2011 - CTA CHEST IMPRESSION:  1. No pulmonary embolus.  2. There is a small amount of consolidation with air bronchogram in the right middle  lobe suspicious for infiltrate. Small amount of atelectasis or infiltrate noted in the left base anteriorly.  3. No hilar or mediastinal adenopathy.  4. Decrease in size of retrocrural adenopathy measures 3.1 x 2 cm. On prior exam measures 4.5 x 3.1 cm.  5. No pulmonary edema.  Assessment/Plan  Principal Problem:  *Neutropenic fever - possibly related to recent chemo pt has received - I have called oncologist on call Dr. Twanna Hy and he recommended continuing Vancomycin and Zosyn - obtain blood cultures - neutropenic precautions - Dr. Twanna Hy did not recommend Neulasta, please note that pt has received Neulasta 01/22/2011 after chemotherapy - pt's last chemotherapy was 4 days prior to admission - Dr. Twanna Hy will notify primary oncologist to evaluate pt in AM  Active Problems:  ARF (acute renal  failure) - likely prerenal in etiology - provide gentle hydration with NS - check BMP in AM   PNA (pneumonia) - discussed with Dr. Twanna Hy who was on call for oncology - he agrees with broad spectrum antibiotics for now - continue to monitor vitals in SDU per floor protocol - in case of respiratory distress please note that pt is full code at this time   Seminoma - on call oncologist notified and will let primary oncologist know   Right leg DVT - continue Lovenox per pharmacy   Thrombocytopenia - no signs of acute bleeding at this time - will obtain CBC in AM   Hyponatremia - unclear etiology at this time, ? SIADH/volume status - pt has no focal neurologic findings and is not lethargic at this time, follows commands appropriately - will provide gentle hydration - obtain BMP in AM   Disposition - plan of care and diagnosis, diagnostic studies and test results were discussed with pt and family at bedside - pt and  family verbalized understanding  Time Spent on Admission: Over 30 minutes  MAGICK-Nikkia Devoss 01/26/2011, 8:55 PM  [facility] Pager [phone]

## 2011-01-26 NOTE — ED Notes (Signed)
O2 sat noted to 84%. O2 at 3.5 l/min via [place]. O2 probe repositioned., O2 decreased to 2l/min via [place]. Sats increased to 95%.  RT paged to come to place patient on Venti-mask.

## 2011-01-27 ENCOUNTER — Encounter (HOSPITAL_COMMUNITY): Payer: Self-pay | Admitting: *Deleted

## 2011-01-27 DIAGNOSIS — D696 Thrombocytopenia, unspecified: Secondary | ICD-10-CM

## 2011-01-27 DIAGNOSIS — E871 Hypo-osmolality and hyponatremia: Secondary | ICD-10-CM

## 2011-01-27 LAB — BASIC METABOLIC PANEL
BUN: 16 mg/dL (ref 6–23)
CO2: 18 mEq/L — ABNORMAL LOW (ref 19–32)
Chloride: 89 mEq/L — ABNORMAL LOW (ref 96–112)
Creatinine, Ser: 1.19 mg/dL (ref 0.50–1.35)
Glucose, Bld: 71 mg/dL (ref 70–99)

## 2011-01-27 LAB — CBC
HCT: 29.4 % — ABNORMAL LOW (ref 39.0–52.0)
MCHC: 36.4 g/dL — ABNORMAL HIGH (ref 30.0–36.0)
MCV: 83.8 fL (ref 78.0–100.0)
RDW: 12.5 % (ref 11.5–15.5)

## 2011-01-27 LAB — OSMOLALITY: Osmolality: 250 mOsm/kg — ABNORMAL LOW (ref 275–300)

## 2011-01-27 LAB — URINE CULTURE

## 2011-01-27 LAB — PROTIME-INR: INR: 1.68 — ABNORMAL HIGH (ref 0.00–1.49)

## 2011-01-27 MED ORDER — POTASSIUM CHLORIDE CRYS ER 20 MEQ PO TBCR
40.0000 meq | EXTENDED_RELEASE_TABLET | Freq: Once | ORAL | Status: AC
  Administered 2011-01-27: 40 meq via ORAL
  Filled 2011-01-27: qty 2

## 2011-01-27 MED ORDER — ACETAMINOPHEN 325 MG PO TABS
650.0000 mg | ORAL_TABLET | ORAL | Status: DC | PRN
  Administered 2011-01-27 – 2011-01-28 (×2): 650 mg via ORAL
  Filled 2011-01-27 (×2): qty 2

## 2011-01-27 MED ORDER — SODIUM CHLORIDE 0.9 % IV SOLN
INTRAVENOUS | Status: DC
  Administered 2011-01-27: 125 mL/h via INTRAVENOUS
  Administered 2011-01-28: 100 mL/h via INTRAVENOUS

## 2011-01-27 MED ORDER — CIPROFLOXACIN IN D5W 400 MG/200ML IV SOLN
400.0000 mg | Freq: Two times a day (BID) | INTRAVENOUS | Status: DC
  Administered 2011-01-27: 400 mg via INTRAVENOUS
  Filled 2011-01-27: qty 200

## 2011-01-27 NOTE — Consult Note (Signed)
NAME:  KARTHIK, WHITTINGHILL NO.:  [id number]  MEDICAL RECORD NO.:  [id number]  LOCATION:  WA20                         FACILITY:  [facility]  PHYSICIAN:  Josph Macho, M.D.  DATE OF BIRTH:  07-26-1950  DATE OF CONSULTATION: DATE OF DISCHARGE:                                CONSULTATION   REFERRING PHYSICIAN:  Susa Raring, MD  ROOM:  ER-20A.  REASONS FOR CONSULTATION: 1. Febrile and neutropenia. 2. 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 3. DVT of the right leg.  HISTORY OF PRESENT ILLNESS:  Mr. Maultsby is a real nice 60 year old white gentleman.  He is followed by Dr. Arline Asp.  He started his chemotherapy with BEP last week.  He presented with enlarged, I think, right testicle.  He had massive retroperitoneal disease.  Prior to his chemotherapy, he developed DVT of the right leg.  He was on Lovenox at home.  He was treated with BEP.  He soon tolerated this well.  He did get Neulasta following his first day of chemotherapy.  He presented to the emergency room with fever.  His temperature, I think was 99.2.  Also, he was somewhat dyspneic.  He did have a chest x-ray done, which did show consolidation of the right middle lobe consistent with pneumonia.  On presentation to the ER, his CBC was normal which showed a white cell count of 0.1, hemoglobin 11, hematocrit 30.6, platelet count 62,.  His sodium was down at 114.  Chloride was 83.  Potassium 3.6.  BUN 23 and creatinine 1.4.  LFTs were okay.  His pro-beta natriuretic peptide was 573.  The blood gas was done, which showed a pH of 7.5, pO2 of 58, pCO2 of 25.  He now is awaiting admission to the floor as there are no rooms available right now.  He was started on antibiotics with vancomycin and Zosyn.  Gram cultures are taken.  PAST MEDICAL HISTORY:  Remarkable for; 1. The stage IIC seminoma of the right testicle. 2. DVT of the right leg.  ALLERGIES:  None.  CURRENT MEDICATIONS: 1. Zosyn 3.375  g IV q.8 hours. 2. Lovenox 120 mg subcu daily. 3. Allopurinol 300 mg p.o. daily. 4. Vancomycin 1000 mg daily. 5. Compazine 10 mg p.o. q.6 hours p.r.n. 6. OxyIR 5-10 mg p.o. q.4 hours p.r.n.  SOCIAL HISTORY:  Remarkable for tobacco use.  He says he still smokes some.  No alcohol use.  He has no occupational exposures.  REVIEW OF SYSTEMS:  As stated in the history of present illness.  He does have some dyspnea.  There is no dysphagia.  He has little bit of a cough that is nonproductive.  There is no hemoptysis.  He has no diarrhea.  There has been some slight constipation.  His right leg is not as painful and swollen.  He has had no rashes.  PHYSICAL EXAMINATION:  GENERAL:  This is a fairly well-developed, well- nourished white gentleman in no obvious distress. VITAL SIGNS:  Show a temperature of 101.5, pulse 90, respiratory rate 30, blood pressure 110/65, oxygen saturation on 2 L is 96%. HEAD AND NECK EXAM:  Shows a normocephalic, atraumatic skull.  His oral mucosa is dry.  Conjunctivae are slightly pale.  He has no adenopathy in the neck.  There are no supraclavicular lymph nodes. LUNGS:  Show wheezes bilaterally. CARDIAC EXAM:  Tachycardia, but regular.  He has no murmurs, rubs, or bruits. ABDOMINAL EXAM:  Soft with good bowel sounds.  There may be some slight fullness in the periumbilical area.  No obvious abdominal masses are noted.  There is no palpable hepatosplenomegaly. GENITOURINARY:  Testicular exam does show enlarged right testicle. EXTREMITIES:  Show some 1+ nonpitting edema of the right leg.  He has good pulses in his distal extremities.  He has good range of motion of his joints.  No obvious venous cords were noted in the legs. NEUROLOGICAL EXAM:  No focal neurological deficit.  LABORATORY STUDIES:  White blood cell count 0.1, hemoglobin 10.7, hematocrit 29.4, platelet count 37.  Sodium 120, potassium 3.1, BUN 16, creatinine 1.19.  He did have a CT angiogram done  of the chest.  There is no pulmonary embolus.  He has an already decreasing retrocrural adenopathy.  No hilar or mediastinal lymph nodes noted.  There is consolidation of the right middle lobe.  IMPRESSION:  Mr. Diodato is a 60 year old gentleman with stage IIC seminoma.  He has had 1 set of chemotherapy.  I am quite surprised that his blood count did drop so quickly.  It is clear that he may need to have some dose adjustment with his next cycle of chemotherapy.  He already received Neulasta.  I do not see a role for Neupogen.  I think, we just have to wait for his white cell count to recover.  Hopefully, this will be shortly.  He became profoundly hyponatremic.  He is responding to IV fluid resuscitation.  As such, I do not suspect this is syndrome of inappropriate secretion of antidiuretic hormone.  He is on Lovenox.  Despite his thrombocytopenia, I believe that Lovenox is still appropriate for him.  This I feel would be safe.  The mechanism of action for Lovenox is totally independent of his platelet count.  He really wants to go home.  I told him that he likely will be in the hospital for another 5-7 days.  We will follow Mr. Allerton along.  I will notify Dr. Arline Asp of his admission.     Josph Macho, M.D.     PRE/MEDQ  D:  01/27/2011  T:  01/27/2011  Job:  [id number]  cc:   Antony Madura, M.D. Fax: [phone]

## 2011-01-27 NOTE — ED Notes (Addendum)
Patient taken off NRB. Placed on O2 @ 2 liters/min via [place]. O2 sats 94-96% on 3 liters.

## 2011-01-27 NOTE — ED Notes (Signed)
Patient 86% on room air. Patient placed on O2 at 2L/min via [place]. O2 sats increased to 88-89% on 3L/min via [place]. Patient placed back on NRB mask at 15L.  O2 sats increased to 96%.

## 2011-01-27 NOTE — Progress Notes (Signed)
[id number] is dict #.

## 2011-01-27 NOTE — Progress Notes (Signed)
ANTICOAGULATION CONSULT NOTE - Initial Consult  Pharmacy Consult for Lovenox Indication: RLE DVT  No Known Allergies  Patient Measurements: Height: 6' 0.05" (183 cm) Weight: 188 lb 4.4 oz (85.4 kg) IBW/kg (Calculated) : 77.71  Adjusted Body Weight:   Vital Signs: Temp: 101.5 F (38.6 C) (12/20 0453) Temp src: Rectal (12/20 0453) BP: 101/62 mmHg (12/20 0700) Pulse Rate: 90  (12/20 0700)  Labs:  Basename 01/27/11 0506 01/26/11 1616 01/26/11 1359  HGB 10.7* 11.3* --  HCT 29.4* 30.6* 37.5*  PLT 37* 62* 82*  APTT 54* -- --  LABPROT 20.1* -- --  INR 1.68* -- --  HEPARINUNFRC -- -- --  CREATININE 1.19 1.41* 1.56*  CKTOTAL -- 58 --  CKMB -- 2.0 --  TROPONINI -- <0.30 --   Estimated Creatinine Clearance: 72.5 ml/min (by C-G formula based on Cr of 1.19).  Medical History: Past Medical History  Diagnosis Date  . Cancer     Medications:  Scheduled:    . allopurinol  300 mg Oral Daily  . ciprofloxacin  400 mg Intravenous Q12H  . enoxaparin  120 mg Subcutaneous Q24H  . piperacillin-tazobactam (ZOSYN)  IV  3.375 g Intravenous Q8H  . piperacillin-tazobactam (ZOSYN)  IV  4.5 g Intravenous To ER  . potassium chloride  40 mEq Oral Once  . sodium chloride  1,000 mL Intravenous Once  . vancomycin  1,000 mg Intravenous To ER  . vancomycin  1,000 mg Intravenous Q12H  . DISCONTD: ondansetron (ZOFRAN) IV  4 mg Intravenous Once  . DISCONTD: piperacillin-tazobactam (ZOSYN)  IV  2.25 g Intravenous Q8H  . DISCONTD: vancomycin  1,000 mg Intravenous Q12H    Assessment:  60 yo male with metastatic seminoma s/p chemotherapy 4 days prior to admission  Patient is on chronic Lovenox 1.5mg /kg q24h for known hx of right leg DVT  Admitting MD has D/W Dr Monika Salk regarding plt 35, who recommends to continue Lovenox  Since weight is stable & renal function is normal, will continue with home regimen  Goal of Therapy:  Therapeutic anticoagulation   Plan:   Continue Lovenox 120mg  SQ  q24h  Follow CBC daily   Monitor closely for s/sx bleeding  Monitor renal function   Rollene Fare 01/27/2011,8:30 AM Pager: [phone]

## 2011-01-27 NOTE — Progress Notes (Addendum)
Dennis [name] ZOX:[id number],WJX:[id number] is a 60 y.o. male,  Outpatient Primary MD for the patient is [name], Dennis Ammons, MD, MD  Chief Complaint  Patient presents with  . Nausea  . Shortness of Breath        Subjective:   Dennis [name] today has, No headache, No chest pain, No abdominal pain - No Nausea, No new weakness tingling or numbness, much better Cough - SOB.    Objective:   Filed Vitals:   01/27/11 0403 01/27/11 0453 01/27/11 0635 01/27/11 0700  BP: 107/57 117/74 110/65 101/62  Pulse: 91 94 90 90  Temp:  101.5 F (38.6 C)    TempSrc:  Rectal    Resp: 27 30 33 26  Height:      Weight:      SpO2: 92% 100% 96% 96%    Wt Readings from Last 3 Encounters:  01/26/11 85.4 kg (188 lb 4.4 oz)  01/17/11 85.367 kg (188 lb 3.2 oz)  01/10/11 81.965 kg (180 lb 11.2 oz)     Intake/Output Summary (Last 24 hours) at 01/27/11 2130 Last data filed at 01/27/11 0523  Gross per 24 hour  Intake   1000 ml  Output    325 ml  Net    675 ml    Exam Awake Alert, Oriented *3, No new F.N deficits, Normal affect [place].AT,[name] Supple Neck,No JVD, No cervical lymphadenopathy appriciated.  Symmetrical Chest wall movement, Good air movement bilaterally, CTAB RRR,No Gallops,Rubs or new Murmurs, No Parasternal Heave +ve B.Sounds, Abd Soft, Non tender, No organomegaly appriciated, No rebound -guarding or rigidity. No Cyanosis, Clubbing or edema, No new Rash or bruise    Data Review  CBC  Lab 01/27/11 0506 01/26/11 1616 01/26/11 1359 01/24/11 1127 01/21/11 0906  WBC 0.1* 0.1* < 0.2* 7.5 9.6  HGB 10.7* 11.3* 13.1 13.9 12.2*  HCT 29.4* 30.6* 37.5* 40.4 34.9*  PLT 37* 62* 82* 215 300  MCV 83.8 84.3 90.8 93.2 89.7  MCH 30.5 31.1 31.7 32.0 31.4  MCHC 36.4* 36.9* 34.9 34.3 35.0  RDW 12.5 12.2 13.4 13.2 12.7  LYMPHSABS -- 0.0* 0.1* 0.1* 0.2*  MONOABS -- 0.0* 0.0* 0.0* 0.0*  EOSABS -- 0.0 0.0 0.0 0.0  BASOSABS -- 0.0 0.0 0.0 0.0  BANDABS -- -- -- -- --    Chemistries   Lab  01/27/11 0506 01/26/11 1616 01/26/11 1359 01/24/11 1127 01/21/11 0905  NA 120* 114* 119* 127* 128*  K 3.1* 3.6 3.9 4.1 4.5  CL 89* 83* 85* 91* 99  CO2 18* 21 23 25 22   GLUCOSE 71 78 84 80 129*  BUN 16 23 23 19  26*  CREATININE 1.19 1.41* 1.56* 0.99 0.97  CALCIUM 7.5* 7.6* 8.5 8.7 8.4  MG -- -- -- -- --  AST -- 14 -- -- --  ALT -- 12 -- -- --  ALKPHOS -- 68 -- -- --  BILITOT -- 1.3* -- -- --   ------------------------------------------------------------------------------------------------------------------ estimated creatinine clearance is 72.5 ml/min (by C-G formula based on Cr of 1.19). ------------------------------------------------------------------------------------------------------------------ No results found for this basename: HGBA1C:2 in the last 72 hours ------------------------------------------------------------------------------------------------------------------ No results found for this basename: CHOL:2,HDL:2,LDLCALC:2,TRIG:2,CHOLHDL:2,LDLDIRECT:2 in the last 72 hours ------------------------------------------------------------------------------------------------------------------ No results found for this basename: TSH,T4TOTAL,FREET3,T3FREE,THYROIDAB in the last 72 hours ------------------------------------------------------------------------------------------------------------------ No results found for this basename: VITAMINB12:2,FOLATE:2,FERRITIN:2,TIBC:2,IRON:2,RETICCTPCT:2 in the last 72 hours  Coagulation profile No results found for this basename: INR:5,PROTIME:5 in the last 168 hours  No results found for this basename: DDIMER:2 in the last 72 hours  Cardiac  Enzymes  Lab 01/26/11 1616  CKMB 2.0  TROPONINI <0.30  MYOGLOBIN --   ------------------------------------------------------------------------------------------------------------------ No components found with this basename: POCBNP:3  Micro Results Recent Results (from the past 240 hour(s))    TECHNOLOGIST REVIEW     Status: Normal   Collection Time   01/26/11  1:59 PM      Component Value Range Status Comment   Technologist Review Occas Lymph, Rare Seg   Final     Radiology Reports Dg Chest 2 View  01/26/2011  *RADIOLOGY REPORT*  Clinical Data: Shortness of breath  CHEST - 2 VIEW  Comparison: 12/10/2010  Findings: Cardiomediastinal silhouette is stable.  Mild thoracic spine osteopenia.  Hazy airspace disease right base suspicious for infiltrate.  Trace left basilar atelectasis or infiltrate.  No pulmonary edema.  IMPRESSION: Hazy airspace disease right base suspicious for infiltrate.  Trace left basilar atelectasis or infiltrate.  No pulmonary edema.  Original Report Authenticated By: Natasha Mead, M.D.   Ct Angio Chest W/cm &/or Wo Cm  01/26/2011  *RADIOLOGY REPORT*  Clinical Data:  Shortness of breath, recent DVT, rule out pulmonary embolus  CT ANGIOGRAPHY CHEST WITH CONTRAST  Technique:  Multidetector CT imaging of the chest was performed using the standard protocol during bolus administration of intravenous contrast.  Multiplanar CT image reconstructions including MIPs were obtained to evaluate the vascular anatomy.  Contrast: 90mL OMNIPAQUE IOHEXOL 300 MG/ML IV SOLN  Comparison:  12/10/2010  Findings:  Images of the thoracic inlet are unremarkable.  Central airways are patent.  Mild atherosclerotic calcifications of thoracic aorta.  No hilar or mediastinal adenopathy.  Sagittal images of the spine shows mild degenerative changes thoracic spine.  No destructive bony lesions are noted.  Sagittal view of the sternum is unremarkable.  No pulmonary embolus is noted.  Images of the lung parenchyma shows no pulmonary edema.  In axial image 71 there is  small amount of consolidation in the right middle lobe suspicious for infiltrate.  Small amount of triangular- shaped consolidation noted in the left base anteriorly with air bronchogram suspicious for atelectasis or infiltrate.  Mild bronchial  thickening noted in the right middle lobe.  The previous retrocrural adenopathy has decreased in size. Measures 3.1 x 2 cm on the prior exam measures 4.5 x 3.1 cm.  Heart size is within normal limits.  Atherosclerotic calcifications of the coronary arteries are noted.  Review of the MIP images confirms the above findings.\   IMPRESSION:  1.  No pulmonary embolus. 2.  There is a small amount of consolidation with air bronchogram in the right middle lobe suspicious for infiltrate.  Small amount of atelectasis or infiltrate noted in the left base anteriorly. 3.  No hilar or mediastinal adenopathy. 4.  Decrease in size of retrocrural adenopathy measures 3.1 x 2 cm. On prior exam measures 4.5 x 3.1 cm. 5.  No pulmonary edema.  Original Report Authenticated By: Natasha Mead, M.D.    Scheduled Meds:   . allopurinol  300 mg Oral Daily  . ciprofloxacin  400 mg Intravenous Q12H  . enoxaparin  120 mg Subcutaneous Q24H  . piperacillin-tazobactam (ZOSYN)  IV  3.375 g Intravenous Q8H  . piperacillin-tazobactam (ZOSYN)  IV  4.5 g Intravenous To ER  . potassium chloride  40 mEq Oral Once  . sodium chloride  1,000 mL Intravenous Once  . vancomycin  1,000 mg Intravenous To ER  . vancomycin  1,000 mg Intravenous Q12H  . DISCONTD: ondansetron (ZOFRAN) IV  4 mg Intravenous Once  .  DISCONTD: piperacillin-tazobactam (ZOSYN)  IV  2.25 g Intravenous Q8H  . DISCONTD: vancomycin  1,000 mg Intravenous Q12H   Continuous Infusions:   . sodium chloride    . DISCONTD: sodium chloride Stopped (01/27/11 0750)   PRN Meds:.acetaminophen, albuterol, iohexol, morphine, oxyCODONE, prochlorperazine, prochlorperazine  Assessment & Plan   1. Neutropenic fever due to Chemo for Seminoma  D/W Dr Monika Salk today, ABX only for now, will add cipro to vanco-Zosyn, cultures pending, pt feeling better on V.mask, in no distress.Daily CBC w diff. Neut precautions.D/W Dr Orvan Falconer ID recommends holding off Cipro.   2. ARF (acute renal  failure) with hyponatremia  - likely prerenal in etiology  - provide gentle hydration with NS  - monitor BMP, improving  3. PNA (pneumonia)   From #1 above, as #1, in SDU, better.  4. Right leg DVT in the setting of Thrombocytopenia - continue Lovenox per pharmacy - D/W Dr Monika Salk plt 35, recommends to continue Lovenox.  5.Hyponatremia  Appears pre renal - check Ur and Sr Osm, Ur Na, gentle IVF, Gatorade PO.  6.Hypokalemia - replace  7.Chr Diarrhea - R/O C diff.    DVT Prophylaxis  Lovenox    See all Orders from today for further details  Prognosis guarded   Leroy Sea M.D  01/27/2011, 8:12 AM  [facility] Office  [phone]

## 2011-01-27 NOTE — ED Notes (Signed)
MD at bedside from Cardiology,.Marland KitchenMarland KitchenHospitalists also in to see pt. Ordered IV NS stopped as pt's Na = 114. IVF d/c. Vancomycin still infusing.Pt denies complaint of pain and is resting comfortably. Pt does become SOB with exertion/talking. Ststes he does continue to smoke some.

## 2011-01-27 NOTE — ED Notes (Signed)
Pt on airborne and contact precautions 

## 2011-01-27 NOTE — ED Notes (Signed)
Report called to Andrea,RN in Step-down. Pt may not get this bed as more acute pt is expected in ICU. RN will call me back and inform me of plan.

## 2011-01-28 ENCOUNTER — Inpatient Hospital Stay (HOSPITAL_COMMUNITY): Payer: 59

## 2011-01-28 LAB — CBC
Hemoglobin: 9.9 g/dL — ABNORMAL LOW (ref 13.0–17.0)
MCH: 30.4 pg (ref 26.0–34.0)
MCHC: 36.5 g/dL — ABNORMAL HIGH (ref 30.0–36.0)
RDW: 12.5 % (ref 11.5–15.5)

## 2011-01-28 LAB — URINE MICROSCOPIC-ADD ON

## 2011-01-28 LAB — CLOSTRIDIUM DIFFICILE BY PCR: Toxigenic C. Difficile by PCR: NEGATIVE

## 2011-01-28 LAB — DIFFERENTIAL
Basophils Relative: 0 % (ref 0–1)
Lymphs Abs: 0 10*3/uL — ABNORMAL LOW (ref 0.7–4.0)
Monocytes Absolute: 0 10*3/uL — ABNORMAL LOW (ref 0.1–1.0)
Neutrophils Relative %: 0 % — ABNORMAL LOW (ref 43–77)

## 2011-01-28 LAB — SODIUM, URINE, RANDOM: Sodium, Ur: 23 mEq/L

## 2011-01-28 LAB — BASIC METABOLIC PANEL
BUN: 14 mg/dL (ref 6–23)
CO2: 21 mEq/L (ref 19–32)
Chloride: 90 mEq/L — ABNORMAL LOW (ref 96–112)
Creatinine, Ser: 0.96 mg/dL (ref 0.50–1.35)
GFR calc Af Amer: >90 mL/min (ref >90)

## 2011-01-28 LAB — OSMOLALITY, URINE: Osmolality, Ur: 520 mOsm/kg (ref 390–1090)

## 2011-01-28 LAB — URINALYSIS, ROUTINE W REFLEX MICROSCOPIC
Bilirubin Urine: NEGATIVE
Ketones, ur: NEGATIVE mg/dL
Nitrite: NEGATIVE
Protein, ur: 100 mg/dL — AB
Urobilinogen, UA: 1 mg/dL (ref 0.0–1.0)

## 2011-01-28 LAB — MAGNESIUM: Magnesium: 2.3 mg/dL (ref 1.5–2.5)

## 2011-01-28 MED ORDER — POTASSIUM CHLORIDE 10 MEQ/100ML IV SOLN
10.0000 meq | INTRAVENOUS | Status: AC
  Administered 2011-01-28 – 2011-01-29 (×4): 10 meq via INTRAVENOUS
  Filled 2011-01-28 (×2): qty 100

## 2011-01-28 MED ORDER — MAGNESIUM SULFATE 50 % IJ SOLN: 1.0000 g | Freq: Once | INTRAMUSCULAR | Status: DC

## 2011-01-28 MED ORDER — POTASSIUM CHLORIDE CRYS ER 20 MEQ PO TBCR
40.0000 meq | EXTENDED_RELEASE_TABLET | Freq: Two times a day (BID) | ORAL | Status: DC
  Administered 2011-01-28 (×2): 40 meq via ORAL
  Filled 2011-01-28 (×4): qty 2
  Filled 2011-01-28: qty 1
  Filled 2011-01-28: qty 2
  Filled 2011-01-28: qty 1
  Filled 2011-01-28: qty 2

## 2011-01-28 MED ORDER — BOOST PLUS PO LIQD
237.0000 mL | Freq: Three times a day (TID) | ORAL | Status: DC
  Administered 2011-01-29 – 2011-02-02 (×9): 237 mL via ORAL
  Filled 2011-01-28 (×32): qty 237

## 2011-01-28 MED ORDER — POTASSIUM CHLORIDE 10 MEQ/100ML IV SOLN
INTRAVENOUS | Status: AC
  Administered 2011-01-28: 10 meq via INTRAVENOUS
  Filled 2011-01-28: qty 200

## 2011-01-28 MED ORDER — SODIUM CHLORIDE 0.9 % IV SOLN
Freq: Once | INTRAVENOUS | Status: AC
  Administered 2011-01-28: 1000 mL/h via INTRAVENOUS

## 2011-01-28 MED ORDER — SODIUM CHLORIDE 0.9 % IV SOLN
INTRAVENOUS | Status: AC
  Administered 2011-01-28 – 2011-01-29 (×2): via INTRAVENOUS
  Filled 2011-01-28 (×4): qty 1000

## 2011-01-28 MED ORDER — SODIUM CHLORIDE 0.9 % IV SOLN: INTRAVENOUS | Status: DC | PRN

## 2011-01-28 MED ORDER — LOPERAMIDE HCL 2 MG PO CAPS
2.0000 mg | ORAL_CAPSULE | Freq: Four times a day (QID) | ORAL | Status: DC | PRN
  Administered 2011-01-31 – 2011-02-01 (×4): 2 mg via ORAL
  Filled 2011-01-28 (×4): qty 1

## 2011-01-28 MED ORDER — SODIUM CHLORIDE 0.9 % IV SOLN
Freq: Once | INTRAVENOUS | Status: AC
  Administered 2011-01-28: 17:00:00 via INTRAVENOUS

## 2011-01-28 MED ORDER — POTASSIUM CHLORIDE 10 MEQ/100ML IV SOLN
10.0000 meq | INTRAVENOUS | Status: AC
  Administered 2011-01-28 (×4): 10 meq via INTRAVENOUS
  Filled 2011-01-28 (×4): qty 100

## 2011-01-28 MED ORDER — SODIUM CHLORIDE 0.9 % IV BOLUS (SEPSIS)
1000.0000 mL | Freq: Once | INTRAVENOUS | Status: AC
  Administered 2011-01-28: 1000 mL via INTRAVENOUS

## 2011-01-28 MED ORDER — MAGNESIUM SULFATE IN D5W 10-5 MG/ML-% IV SOLN
1.0000 g | Freq: Once | INTRAVENOUS | Status: AC
  Administered 2011-01-28: 1 g via INTRAVENOUS
  Filled 2011-01-28: qty 100

## 2011-01-28 NOTE — Progress Notes (Signed)
INITIAL ADULT NUTRITION ASSESSMENT Date: 01/28/2011   Time: 9:35 AM  Reason for Assessment: Consult-unintentional weight loss > 10# in past month  ASSESSMENT: Male 60 y.o.  Dx: Nasocomial PNA-Neutropenic due to chemo for Seminoma, SIRs,  DVT, hyponatremia, hypokalemia, chronic diarrhea  Hx:  Past Medical History  Diagnosis Date  . Cancer     right testicle, behind stomach   Past Surgical History  Procedure Date  . Appendectomy     Related Meds: lovenox, magnesium, KCL, vancocin  Ht: 6' (182.9 cm)  Wt: 177 lb 4 oz (80.4 kg)  Ideal Wt: 81kg % Ideal Wt: 99  Usual Wt: 190# 1 year ago, 187# 6 months ago, 185# prior to admit % Usual Wt: 93-95, 96% of usual prior to admit.  Body mass index is 24.04 kg/(m^2).  Food/Nutrition Related Hx:  Regular diet prior to admission with decreased intake . "Had to push self to eat".  Used Boost at times.   Labs:  CMP     Component Value Date/Time   NA 123* 01/28/2011 0610   K 2.6* 01/28/2011 0610   CL 90* 01/28/2011 0610   CO2 21 01/28/2011 0610   GLUCOSE 89 01/28/2011 0610   BUN 14 01/28/2011 0610   CREATININE 0.96 01/28/2011 0610   CALCIUM 7.1* 01/28/2011 0610   PROT 5.9* 01/26/2011 1616   ALBUMIN 2.5* 01/26/2011 1616   AST 14 01/26/2011 1616   ALT 12 01/26/2011 1616   ALKPHOS 68 01/26/2011 1616   BILITOT 1.3* 01/26/2011 1616   GFRNONAA 88* 01/28/2011 0610   GFRAA >90 01/28/2011 0610      Intake: I/O last 3 completed shifts: In: 2010 [P.O.:960; I.V.:1000; IV Piggyback:50] Out: 1325 [Urine:1325] Total I/O In: -  Out: 251 [Urine:250; Stool:1]   Diet Order: Heart Healthy  Supplements/Tube Feeding:none  IVF:    0.9 % sodium chloride with kcl   DISCONTD: sodium chloride Last Rate: 100 mL/hr at 01/28/11 0500    Estimated Nutritional Needs:   Kcal: 2150-2250 Protein: 110-120 Fluid: 2L   Current intake poor with very poor appetite.  4% weight loss recently  Pt meets criteria for moderate malnutrition (acute)  related to inadequate oral intake aeb weight loss of 4% in past week.  NUTRITION DIAGNOSIS: -Inadequate oral intake (NI-2.1).  Status: Ongoing  RELATED TO: poor appetite, nausea  AS EVIDENCE BY: weight loss and pt reported poor intake  MONITORING/EVALUATION(Goals): Improved po intake to prevent further weight loss.  EDUCATION NEEDS: -Education needs addressed, discussed need to increase intake  INTERVENTION: 1.  Encourage po 2.  Pt to select meals 3.  Magic cup with meals 4.  Chocolate Boost tid   Dietitian [phone]  DOCUMENTATION CODES Per approved criteria  -Non-severe (moderate) malnutrition in the context of acute illness or injury    Jeoffrey Massed 01/28/2011, 9:35 AM

## 2011-01-28 NOTE — Progress Notes (Addendum)
Dennis [name] AVW:[name]:[id number] is a 60 y.o. male,  Outpatient Primary MD for the patient is [name], Dennis Ammons, MD, MD  Chief Complaint  Patient presents with  . Nausea  . Shortness of Breath        Subjective:   [place] today has, No headache, No chest pain, No abdominal pain - No Nausea, No new weakness tingling or numbness, much better Cough - SOB.    Objective:   Filed Vitals:   01/28/11 0000 01/28/11 0100 01/28/11 0400 01/28/11 0500  BP:  108/60 106/64 109/59  Pulse:  91 94 92  Temp: 100.5 F (38.1 C)  99.5 F (37.5 C)   TempSrc: Oral  Oral   Resp:  28 28 27   Height:      Weight:   80.4 kg (177 lb 4 oz)   SpO2:  98% 100% 100%    Wt Readings from Last 3 Encounters:  01/28/11 80.4 kg (177 lb 4 oz)  01/17/11 85.367 kg (188 lb 3.2 oz)  01/10/11 81.965 kg (180 lb 11.2 oz)     Intake/Output Summary (Last 24 hours) at 01/28/11 0807 Last data filed at 01/28/11 0554  Gross per 24 hour  Intake   1010 ml  Output   1000 ml  Net     10 ml    Exam Awake Alert, Oriented *3, No new F.N deficits, Normal affect [place].AT,[name] Supple Neck,No JVD, No cervical lymphadenopathy appriciated.  Symmetrical Chest wall movement, Good air movement bilaterally, CTAB RRR,No Gallops,Rubs or new Murmurs, No Parasternal Heave +ve B.Sounds, Abd Soft, Non tender, No organomegaly appriciated, No rebound -guarding or rigidity. No Cyanosis, Clubbing or edema, No new Rash or bruise    Data Review  CBC  Lab 01/28/11 0310 01/27/11 0506 01/26/11 1616 01/26/11 1359 01/24/11 1127 01/21/11 0906  WBC 0.1* 0.1* 0.1* < 0.2* 7.5 --  HGB 9.9* 10.7* 11.3* 13.1 13.9 --  HCT 27.1* 29.4* 30.6* 37.5* 40.4 --  PLT 23* 37* 62* 82* 215 --  MCV 83.1 83.8 84.3 90.8 93.2 --  MCH 30.4 30.5 31.1 31.7 32.0 --  MCHC 36.5* 36.4* 36.9* 34.9 34.3 --  RDW 12.5 12.5 12.2 13.4 13.2 --  LYMPHSABS 0.0* -- 0.0* 0.1* 0.1* 0.2*  MONOABS 0.0* -- 0.0* 0.0* 0.0* 0.0*  EOSABS 0.0 -- 0.0 0.0 0.0 0.0    BASOSABS 0.0 -- 0.0 0.0 0.0 0.0  BANDABS -- -- -- -- -- --    Chemistries   Lab 01/28/11 0610 01/27/11 0506 01/26/11 1616 01/26/11 1359 01/24/11 1127  NA 123* 120* 114* 119* 127*  K 2.6* 3.1* 3.6 3.9 4.1  CL 90* 89* 83* 85* 91*  CO2 21 18* 21 23 25   GLUCOSE 89 71 78 84 80  BUN 14 16 23 23 19   CREATININE 0.96 1.19 1.41* 1.56* 0.99  CALCIUM 7.1* 7.5* 7.6* 8.5 8.7  MG -- -- -- -- --  AST -- -- 14 -- --  ALT -- -- 12 -- --  ALKPHOS -- -- 68 -- --  BILITOT -- -- 1.3* -- --   ------------------------------------------------------------------------------------------------------------------ estimated creatinine clearance is 89.8 ml/min (by C-G formula based on Cr of 0.96). ------------------------------------------------------------------------------------------------------------------ No results found for this basename: HGBA1C:2 in the last 72 hours ------------------------------------------------------------------------------------------------------------------ No results found for this basename: CHOL:2,HDL:2,LDLCALC:2,TRIG:2,CHOLHDL:2,LDLDIRECT:2 in the last 72 hours ------------------------------------------------------------------------------------------------------------------ No results found for this basename: TSH,T4TOTAL,FREET3,T3FREE,THYROIDAB in the last 72 hours ------------------------------------------------------------------------------------------------------------------ No results found for this basename: VITAMINB12:2,FOLATE:2,FERRITIN:2,TIBC:2,IRON:2,RETICCTPCT:2 in the last 72 hours  Coagulation profile  Lab 01/27/11  0506  INR 1.68*  PROTIME --    No results found for this basename: DDIMER:2 in the last 72 hours  Cardiac Enzymes  Lab 01/26/11 1616  CKMB 2.0  TROPONINI <0.30  MYOGLOBIN --   ------------------------------------------------------------------------------------------------------------------ No components found with this basename:  POCBNP:3  Micro Results Recent Results (from the past 240 hour(s))  TECHNOLOGIST REVIEW     Status: Normal   Collection Time   01/26/11  1:59 PM      Component Value Range Status Comment   Technologist Review Occas Lymph, Rare Seg   Final   URINE CULTURE     Status: Normal   Collection Time   01/26/11  7:21 PM      Component Value Range Status Comment   Specimen Description URINE, CLEAN CATCH   Final    Special Requests NONE   Final    Setup Time [id number]   Final    Colony Count NO GROWTH   Final    Culture NO GROWTH   Final    Report Status 01/27/2011 FINAL   Final   MRSA PCR SCREENING     Status: Normal   Collection Time   01/27/11  9:39 PM      Component Value Range Status Comment   MRSA by PCR NEGATIVE  NEGATIVE  Final   CLOSTRIDIUM DIFFICILE BY PCR     Status: Normal   Collection Time   01/28/11  3:50 AM      Component Value Range Status Comment   C difficile by pcr NEGATIVE  NEGATIVE  Final     Radiology Reports Dg Chest 2 View  01/26/2011  *RADIOLOGY REPORT*  Clinical Data: Shortness of breath  CHEST - 2 VIEW  Comparison: 12/10/2010  Findings: Cardiomediastinal silhouette is stable.  Mild thoracic spine osteopenia.  Hazy airspace disease right base suspicious for infiltrate.  Trace left basilar atelectasis or infiltrate.  No pulmonary edema.  IMPRESSION: Hazy airspace disease right base suspicious for infiltrate.  Trace left basilar atelectasis or infiltrate.  No pulmonary edema.  Original Report Authenticated By: Natasha Mead, M.D.   Ct Angio Chest W/cm &/or Wo Cm  01/26/2011  *RADIOLOGY REPORT*  Clinical Data:  Shortness of breath, recent DVT, rule out pulmonary embolus  CT ANGIOGRAPHY CHEST WITH CONTRAST  Technique:  Multidetector CT imaging of the chest was performed using the standard protocol during bolus administration of intravenous contrast.  Multiplanar CT image reconstructions including MIPs were obtained to evaluate the vascular anatomy.  Contrast: 90mL  OMNIPAQUE IOHEXOL 300 MG/ML IV SOLN  Comparison:  12/10/2010  Findings:  Images of the thoracic inlet are unremarkable.  Central airways are patent.  Mild atherosclerotic calcifications of thoracic aorta.  No hilar or mediastinal adenopathy.  Sagittal images of the spine shows mild degenerative changes thoracic spine.  No destructive bony lesions are noted.  Sagittal view of the sternum is unremarkable.  No pulmonary embolus is noted.  Images of the lung parenchyma shows no pulmonary edema.  In axial image 71 there is  small amount of consolidation in the right middle lobe suspicious for infiltrate.  Small amount of triangular- shaped consolidation noted in the left base anteriorly with air bronchogram suspicious for atelectasis or infiltrate.  Mild bronchial thickening noted in the right middle lobe.  The previous retrocrural adenopathy has decreased in size. Measures 3.1 x 2 cm on the prior exam measures 4.5 x 3.1 cm.  Heart size is within normal limits.  Atherosclerotic calcifications of the coronary arteries  are noted.  Review of the MIP images confirms the above findings.\   IMPRESSION:  1.  No pulmonary embolus. 2.  There is a small amount of consolidation with air bronchogram in the right middle lobe suspicious for infiltrate.  Small amount of atelectasis or infiltrate noted in the left base anteriorly. 3.  No hilar or mediastinal adenopathy. 4.  Decrease in size of retrocrural adenopathy measures 3.1 x 2 cm. On prior exam measures 4.5 x 3.1 cm. 5.  No pulmonary edema.  Original Report Authenticated By: Natasha Mead, M.D.    Scheduled Meds:    . allopurinol  300 mg Oral Daily  . enoxaparin  120 mg Subcutaneous Q24H  . magnesium sulfate 1 - 4 g bolus IVPB  1 g Intravenous Once  . piperacillin-tazobactam (ZOSYN)  IV  3.375 g Intravenous Q8H  . potassium chloride  10 mEq Intravenous Q1 Hr x 4  . potassium chloride  40 mEq Oral Once  . potassium chloride  40 mEq Oral BID  . vancomycin  1,000 mg  Intravenous Q12H  . DISCONTD: ciprofloxacin  400 mg Intravenous Q12H  . DISCONTD: magnesium sulfate  1 g Intravenous Once   Continuous Infusions:    . 0.9 % sodium chloride with kcl    . DISCONTD: sodium chloride 100 mL/hr at 01/28/11 0500   PRN Meds:.acetaminophen, albuterol, morphine, oxyCODONE, prochlorperazine, prochlorperazine  Assessment & Plan   1.Nosocomial PNA - Neutropenic due to Chemo for Seminoma , SIRs -  D/W Dr Monika Salk 12-28-10, continue ABX  for now, improved clinically, CXR better, ABX D/W Dr Orvan Falconer 01-27-11, HaemOnc to follow. Neut precautions per protocol. IVF continue, BP better. In SDU.  4pm repeat eval - Note I&Os are not correct he has at least gotten >3lits IVF by 4pm 01-28-11 (per order tally total 3.5lits), will give him another 1.0 lit bolus as BP on the lower side at 4pm, D/W PCCM Dr Delford Field he will monitor him. May need another liter later.  5.15pm  re eval - pt looks and feels good, having supper, no SOB, no rales, SBP 103(up from 85 at 4pm), will give another 1lit NS Bolus then at 125cc/hr, 1lit PRN bolus also ordered.   2. ARF (acute renal failure) with hyponatremia  - prerenal in etiology  - resolved with IVF.   3. PNA (pneumonia) -From #1 above, as #1, in SDU, better.   4. Right leg DVT in the setting of Thrombocytopenia - continue Lovenox per pharmacy - D/W Dr Monika Salk plt 35 on 01-27-11, recommends to continue Lovenox. Haem to see today and comment again as Plts lower.   5.Hyponatremia - Appears pre renal - Ur Na 23, IVF contiue, improving, Gatorade PO.   6.Hypokalemia - replace IV and PO, check K and Mag in 6 hrs.   7.Chr Diarrhea - -ve C diff. Will add imodium.   DVT Prophylaxis  Lovenox    See all Orders from today for further details  Prognosis guarded   Leroy Sea M.D  01/28/2011, 8:07 AM  [facility] Office  [phone]

## 2011-01-28 NOTE — Progress Notes (Signed)
UR COMPLETED  

## 2011-01-28 NOTE — Progress Notes (Signed)
Pt. PLT down to 23 from 37 01/27/11. Critical value called to [facility]-Dr. Craige Cotta.

## 2011-01-28 NOTE — Progress Notes (Signed)
Subjective:  Events of the last 24 hrs noted.  Feels weaker than when he was admitted. No MS changes. Still low grade fever, tmax 99.5 F. On IV Abx  (Zosyn per pharmacy, Vanco d/cd on 12/20). Cultures negative to date.C difficile pending. Counts have not improved yet. WBC still 0.1.  Pt had Neulasta 6 mg sq  on 12/15  therefore no Neupogen has been received during this hospitalization to date. Plts 23 k (37k on 12/20 and 62 k on andmission). Pt on Lovenox for DVT prophylaxis. No acute leeding reported.  Objective: Vital signs in last 24 hours: BP 109/59  Pulse 92  Temp(Src) 99 F (37.2 C) (Oral)  Resp 27  Ht 6' (1.829 m)  Wt 177 lb 4 oz (80.4 kg)  BMI 24.04 kg/m2  SpO2 100% Wt: 80.4 kg  Physical Exam: 60 y.o.  in no acute distress  A. and O. x3 HEENT: Sclera anicteric. Oral cavity without thrush or lesions. No gum bleed.  Neck supple.no cervical or supraclavicular adenopathy  Lungs: CTA. No wheezing, rhonchi or rales. No axillary masses. CV regular rate and rhythm normal S1-S2, no murmur , rubs or gallops Abdomen soft nontender , bowel sounds x4  no hepatosplenomegaly GU/rectal: deferred. Extremities: no clubbing cyanosis . No edema.  bruising  At venipuncture sites and injection sites.No petechial rash Neurologic: non focal    Lab Results:  CBC   Lab 01/28/11 0310 01/27/11 0506 01/26/11 1616 01/26/11 1359 01/24/11 1127  WBC 0.1* 0.1* 0.1* < 0.2* 7.5  HGB 9.9* 10.7* 11.3* 13.1 13.9  HCT 27.1* 29.4* 30.6* 37.5* 40.4  PLT 23* 37* 62* 82* 215  MCV 83.1 83.8 84.3 90.8 93.2  MCH 30.4 30.5 31.1 31.7 32.0  MCHC 36.5* 36.4* 36.9* 34.9 34.3  RDW 12.5 12.5 12.2 13.4 13.2  LYMPHSABS 0.0* -- 0.0* 0.1* 0.1*  MONOABS 0.0* -- 0.0* 0.0* 0.0*  EOSABS 0.0 -- 0.0 0.0 0.0  BASOSABS 0.0 -- 0.0 0.0 0.0  BANDABS -- -- -- -- --    CMP    Lab 01/28/11 0610 01/27/11 0506 01/26/11 1616 01/26/11 1359 01/24/11 1127  NA 123* 120* 114* 119* 127*  K 2.6* 3.1* 3.6 3.9 4.1  CL 90* 89* 83* 85*  91*  CO2 21 18* 21 23 25   GLUCOSE 89 71 78 84 80  BUN 14 16 23 23 19   CREATININE 0.96 1.19 1.41* 1.56* 0.99  CALCIUM 7.1* 7.5* 7.6* 8.5 8.7  MG -- -- -- -- --  AST -- -- 14 -- --  ALT -- -- 12 -- --  ALKPHOS -- -- 68 -- --  BILITOT -- -- 1.3* -- --        Component Value Date/Time   BILITOT 1.3* 01/26/2011 1616     Anemia panel: No results found for this basename: VITAMINB12:2,FOLATE:2,FERRITIN:2,TIBC:2,IRON:2,RETICCTPCT:2 in the last 72 hours  No results found for this basename: TSH,T4TOTAL,FREET3,T3FREE,THYROIDAB in the last 72 hours   No results found for this basename: esrsedrate     Lab 01/27/11 0506  INR 1.68*  PROTIME --    No results found for this basename: DDIMER:2 in the last 72 hours  Imaging Studies:  Dg Chest 2 View  01/26/2011  *RADIOLOGY REPORT*  Clinical Data: Shortness of breath  CHEST - 2 VIEW  Comparison: 12/10/2010  Findings: Cardiomediastinal silhouette is stable.  Mild thoracic spine osteopenia.  Hazy airspace disease right base suspicious for infiltrate.  Trace left basilar atelectasis or infiltrate.  No pulmonary edema.  IMPRESSION: Hazy airspace  disease right base suspicious for infiltrate.  Trace left basilar atelectasis or infiltrate.  No pulmonary edema.  Original Report Authenticated By: Natasha Mead, M.D.   Ct Angio Chest W/cm &/or Wo Cm  01/26/2011  *RADIOLOGY REPORT*  Clinical Data:  Shortness of breath, recent DVT, rule out pulmonary embolus  CT ANGIOGRAPHY CHEST WITH CONTRAST  Technique:  Multidetector CT imaging of the chest was performed using the standard protocol during bolus administration of intravenous contrast.  Multiplanar CT image reconstructions including MIPs were obtained to evaluate the vascular anatomy.  Contrast: 90mL OMNIPAQUE IOHEXOL 300 MG/ML IV SOLN  Comparison:  12/10/2010  Findings:  Images of the thoracic inlet are unremarkable.  Central airways are patent.  Mild atherosclerotic calcifications of thoracic aorta.  No  hilar or mediastinal adenopathy.  Sagittal images of the spine shows mild degenerative changes thoracic spine.  No destructive bony lesions are noted.  Sagittal view of the sternum is unremarkable.  No pulmonary embolus is noted.  Images of the lung parenchyma shows no pulmonary edema.  In axial image 71 there is  small amount of consolidation in the right middle lobe suspicious for infiltrate.  Small amount of triangular- shaped consolidation noted in the left base anteriorly with air bronchogram suspicious for atelectasis or infiltrate.  Mild bronchial thickening noted in the right middle lobe.  The previous retrocrural adenopathy has decreased in size. Measures 3.1 x 2 cm on the prior exam measures 4.5 x 3.1 cm.  Heart size is within normal limits.  Atherosclerotic calcifications of the coronary arteries are noted.  Review of the MIP images confirms the above findings.  IMPRESSION:  1.  No pulmonary embolus. 2.  There is a small amount of consolidation with air bronchogram in the right middle lobe suspicious for infiltrate.  Small amount of atelectasis or infiltrate noted in the left base anteriorly. 3.  No hilar or mediastinal adenopathy. 4.  Decrease in size of retrocrural adenopathy measures 3.1 x 2 cm. On prior exam measures 4.5 x 3.1 cm. 5.  No pulmonary edema.  Original Report Authenticated By: Natasha Mead, M.D.   Dg Chest Port 1 View  01/28/2011  *RADIOLOGY REPORT*  Clinical Data: History of recent pneumonia, follow-up  PORTABLE CHEST - 1 VIEW  Comparison: Chest x-ray of 01/26/2011 and CT chest of 01/26/2011.  Findings: There has been improvement in the opacity noted previously the right lung base.  However there is more opacity at the left lung base which may represent atelectasis or pneumonia. No effusion is seen.  Mediastinal contours are stable.  The heart is within upper limits of normal. No bony abnormality is seen.  IMPRESSION:  1.  Improvement right basilar opacity. 2.  Slightly more prominent  markings at the left lung base. Possible pneumonia.  Original Report Authenticated By: Juline Patch, M.D.     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   2. Neutropenia: will hold Neupogen for now. Last Neulasta given on 12/15. Recheck CBC in a.m  3> Neutropenic fever: on IVF, IVAbx  Per IM team. Improving. Cultures negative to date.  4. Thrombocytopenia in the setting of recent chemo and current illness. Pt on Lovenox. Transfuse 1 unit platelets today and for plts below 30k   5. H/o RLE DVT, on Lovenox.  Continue Lovenox for now  6.  Poss R PNA as per admitting team  7. ARF/ Hyponatremia, as per IM  8. Check uric acid today. If  low we may consider holding allopurinol.      LOS: 2 days   [facility] 01/28/2011, 9:55 AM   Chart reviewed.  Patient seen and examined--now in step down unit.  Pt feels a little stronger this evening.  Says diarrhea has ceased--C diff negative.  Denies nausea and vomiting.  BP has returned to normal after about 300 ml of IVF--was 83/40 earlier this afternoon.  Tmax last 24 hrs 100.5.  O2 sat 98% on 3L/min [place].  Lungs fairly clear.  Right leg swelling and scrotal mass are smaller.  Counts still down--now day 12 of chemo cycle=cisplatin and VP-16 x 5 days started 01/17/11.  Hope to see some signs of BM recover soon.  Pt is receiving lovenox 120 mg subq daily for treatment of his right leg DVT diagnosed about 12/10.  Would tend to be liberal with platelet transfusions with Tx for plat count<30k.  Pt just received a bag of platelets.  Plat count earlier today was 23k.  No apparent bleeding or bruising.  Of note were increased PT and PTT from 12/19 c/w 11/20.  Will order DIC panel.  If still elevated, consider giving vit K.  Will check stools for blood.  Pt still receiving Vanc and Zosyn.  Blood and urine cultures negative so far from 12/19.  CXR from today--no major changes.  Getting KCL runs for K+ of 2.9  earlier today.  Continue aggressive support for this potentially curable malignancy.  Pt full code.  Continue close monitoring of counts with Tx as indicated, correct low K+ watch IVF to prevent overload.  If pt has SIADH accounting for low Na+, can see Na+ drop even with saline.  Would try to mobilize, OOB etc soon.  Have asked weekend oncology team to round on pt.  If pt decompensates, will need CCM to assist with management.  Samul Dada 01/28/2011 10:06 PM

## 2011-01-29 ENCOUNTER — Inpatient Hospital Stay (HOSPITAL_COMMUNITY): Payer: 59

## 2011-01-29 DIAGNOSIS — B370 Candidal stomatitis: Secondary | ICD-10-CM

## 2011-01-29 LAB — PREPARE PLATELET PHERESIS: Unit division: 0

## 2011-01-29 LAB — DIFFERENTIAL
Basophils Absolute: 0 10*3/uL (ref 0.0–0.1)
Eosinophils Absolute: 0 10*3/uL (ref 0.0–0.7)
Lymphocytes Relative: 0 % — ABNORMAL LOW (ref 12–46)
Lymphs Abs: 0 10*3/uL — ABNORMAL LOW (ref 0.7–4.0)
Monocytes Absolute: 0 10*3/uL — ABNORMAL LOW (ref 0.1–1.0)
Neutrophils Relative %: 0 % — ABNORMAL LOW (ref 43–77)

## 2011-01-29 LAB — DIC (DISSEMINATED INTRAVASCULAR COAGULATION)PANEL
D-Dimer, Quant: 2.83 ug/mL-FEU — ABNORMAL HIGH (ref 0.00–0.48)
Fibrinogen: >800 mg/dL — ABNORMAL HIGH (ref 204–475)
Platelets: 23 10*3/uL — CL (ref 150–400)
Prothrombin Time: 15.5 seconds — ABNORMAL HIGH (ref 11.6–15.2)
Smear Review: NONE SEEN

## 2011-01-29 LAB — BASIC METABOLIC PANEL
CO2: 20 mEq/L (ref 19–32)
Chloride: 96 mEq/L (ref 96–112)
GFR calc Af Amer: >90 mL/min (ref >90)
Potassium: 3.1 mEq/L — ABNORMAL LOW (ref 3.5–5.1)
Sodium: 124 mEq/L — ABNORMAL LOW (ref 135–145)

## 2011-01-29 LAB — CBC
HCT: 25.4 % — ABNORMAL LOW (ref 39.0–52.0)
Hemoglobin: 9.3 g/dL — ABNORMAL LOW (ref 13.0–17.0)
RBC: 3.05 MIL/uL — ABNORMAL LOW (ref 4.22–5.81)
RDW: 12.7 % (ref 11.5–15.5)
WBC: 0.1 10*3/uL — CL (ref 4.0–10.5)

## 2011-01-29 MED ORDER — FLUCONAZOLE IN SODIUM CHLORIDE 200-0.9 MG/100ML-% IV SOLN
200.0000 mg | INTRAVENOUS | Status: DC
  Administered 2011-01-29: 200 mg via INTRAVENOUS
  Filled 2011-01-29 (×2): qty 100

## 2011-01-29 MED ORDER — SODIUM CHLORIDE 0.9 % IV SOLN
INTRAVENOUS | Status: DC
  Administered 2011-01-30: via INTRAVENOUS
  Filled 2011-01-29 (×2): qty 1000

## 2011-01-29 MED ORDER — POTASSIUM CHLORIDE 10 MEQ/100ML IV SOLN
10.0000 meq | Freq: Once | INTRAVENOUS | Status: AC
  Administered 2011-01-29: 10 meq via INTRAVENOUS
  Filled 2011-01-29: qty 100

## 2011-01-29 MED ORDER — SODIUM CHLORIDE 0.9 % IV SOLN
INTRAVENOUS | Status: AC
  Administered 2011-01-29: 500 mL/h via INTRAVENOUS

## 2011-01-29 MED ORDER — VANCOMYCIN HCL IN DEXTROSE 1-5 GM/200ML-% IV SOLN
1000.0000 mg | Freq: Three times a day (TID) | INTRAVENOUS | Status: DC
  Administered 2011-01-29 – 2011-01-30 (×5): 1000 mg via INTRAVENOUS
  Filled 2011-01-29 (×7): qty 200

## 2011-01-29 MED ORDER — POTASSIUM CHLORIDE 10 MEQ/100ML IV SOLN
10.0000 meq | INTRAVENOUS | Status: AC
  Administered 2011-01-29 (×3): 10 meq via INTRAVENOUS
  Filled 2011-01-29 (×2): qty 100

## 2011-01-29 MED ORDER — ENOXAPARIN SODIUM 100 MG/ML ~~LOC~~ SOLN
100.0000 mg | SUBCUTANEOUS | Status: DC
  Filled 2011-01-29: qty 1

## 2011-01-29 MED ORDER — POTASSIUM CHLORIDE 10 MEQ/100ML IV SOLN
INTRAVENOUS | Status: AC
  Administered 2011-01-29: 10 meq via INTRAVENOUS
  Filled 2011-01-29: qty 100

## 2011-01-29 MED ORDER — ENOXAPARIN SODIUM 100 MG/ML ~~LOC~~ SOLN
100.0000 mg | SUBCUTANEOUS | Status: DC
  Administered 2011-01-29: 100 mg via SUBCUTANEOUS
  Filled 2011-01-29 (×2): qty 1

## 2011-01-29 MED ORDER — POTASSIUM CHLORIDE CRYS ER 20 MEQ PO TBCR
40.0000 meq | EXTENDED_RELEASE_TABLET | Freq: Two times a day (BID) | ORAL | Status: AC
  Administered 2011-01-29 (×2): 40 meq via ORAL
  Filled 2011-01-29: qty 2

## 2011-01-29 NOTE — Progress Notes (Addendum)
IP PROGRESS NOTE 01/29/11 1000  Subjective Dennis [name] is feeling better, he is having some loose BM. His appetite remains poor. He is still nauseated . He has been afebrile for the past 24 hrs. He is on vanc/zosyn, He is on full dose lovenox without evidence of bleeding.  Objective: Temp:  [98.3 F (36.8 C)-100.1 F (37.8 C)] 99 F (37.2 C) (12/22 0800) Pulse Rate:  [83-113] 88  (12/22 0600) Resp:  [26-32] 30  (12/22 0600) BP: (83-136)/(40-79) 94/55 mmHg (12/22 0600) SpO2:  [95 %-100 %] 97 % (12/22 0600) Weight:  [181 lb 7 oz (82.3 kg)] 181 lb 7 oz (82.3 kg) (12/22 0600)  General appearance: alert, cooperative and appears stated age Head: Normocephalic, without obvious abnormality, atraumatic Throat: lips, mucosa, and tongue normal; teeth and gums normal Resp: clear to auscultation bilaterally and normal percussion bilaterally Cardio: regular rate and rhythm, S1, S2 normal, no murmur, click, rub or gallop and normal apical impulse GI: soft, non-tender; bowel sounds normal; no masses,  no organomegaly Extremities: extremities normal, atraumatic, no cyanosis or edema Note : thrush Labs:  [facility] 01/29/11 0418 01/28/11 0310  WBC 0.1* 0.1*  HGB 9.3* 9.9*  HCT 25.4* 27.1*  PLT 23*23* 23*    Basename 01/29/11 0418 01/28/11 1245 01/28/11 0610  NA 124* -- 123*  K 3.1* 2.9* --  CL 96 -- 90*  CO2 20 -- 21  GLUCOSE 74 -- 89  BUN 13 -- 14  CREATININE 0.82 -- 0.96  CALCIUM 7.6* -- 7.1*    Results for orders placed during the hospital encounter of 01/26/11  CULTURE, BLOOD (ROUTINE X 2)     Status: Normal (Preliminary result)   Collection Time   01/26/11  5:10 PM      Component Value Range Status Comment   Specimen Description BLOOD   Final    Special Requests BOTTLES DRAWN AEROBIC AND ANAEROBIC   Final    Setup Time [id number]   Final    Culture     Final    Value:        BLOOD CULTURE RECEIVED NO GROWTH TO DATE CULTURE WILL BE HELD FOR 5 DAYS BEFORE ISSUING A FINAL  NEGATIVE REPORT   Report Status PENDING   Incomplete   CULTURE, BLOOD (ROUTINE X 2)     Status: Normal (Preliminary result)   Collection Time   01/26/11  5:19 PM      Component Value Range Status Comment   Specimen Description BLOOD RIGHT ARM   Final    Special Requests BOTTLES DRAWN AEROBIC AND ANAEROBIC 5CC   Final    Setup Time [id number]   Final    Culture     Final    Value:        BLOOD CULTURE RECEIVED NO GROWTH TO DATE CULTURE WILL BE HELD FOR 5 DAYS BEFORE ISSUING A FINAL NEGATIVE REPORT   Report Status PENDING   Incomplete   URINE CULTURE     Status: Normal   Collection Time   01/26/11  7:21 PM      Component Value Range Status Comment   Specimen Description URINE, CLEAN CATCH   Final    Special Requests NONE   Final    Setup Time [id number]   Final    Colony Count NO GROWTH   Final    Culture NO GROWTH   Final    Report Status 01/27/2011 FINAL   Final   CULTURE, BLOOD (ROUTINE X 2)  Status: Normal (Preliminary result)   Collection Time   01/26/11  9:40 PM      Component Value Range Status Comment   Specimen Description BLOOD RIGHT HAND   Final    Special Requests BOTTLES DRAWN AEROBIC AND ANAEROBIC 3CC   Final    Setup Time [id number]   Final    Culture     Final    Value:        BLOOD CULTURE RECEIVED NO GROWTH TO DATE CULTURE WILL BE HELD FOR 5 DAYS BEFORE ISSUING A FINAL NEGATIVE REPORT   Report Status PENDING   Incomplete   CULTURE, BLOOD (ROUTINE X 2)     Status: Normal (Preliminary result)   Collection Time   01/26/11  9:45 PM      Component Value Range Status Comment   Specimen Description BLOOD RIGHT HAND   Final    Special Requests BOTTLES DRAWN AEROBIC ONLY 3CC   Final    Setup Time [id number]   Final    Culture     Final    Value:        BLOOD CULTURE RECEIVED NO GROWTH TO DATE CULTURE WILL BE HELD FOR 5 DAYS BEFORE ISSUING A FINAL NEGATIVE REPORT   Report Status PENDING   Incomplete   MRSA PCR SCREENING     Status: Normal   Collection Time     01/27/11  9:39 PM      Component Value Range Status Comment   MRSA by PCR NEGATIVE  NEGATIVE  Final   CLOSTRIDIUM DIFFICILE BY PCR     Status: Normal   Collection Time   01/28/11  3:50 AM      Component Value Range Status Comment   C difficile by pcr NEGATIVE  NEGATIVE  Final      Dg Chest 1 View  01/29/2011  *RADIOLOGY REPORT*  Clinical Data: Pneumonia, follow-up  CHEST - 1 VIEW  Comparison: Portable chest x-ray of 01/28/2011  Findings: As noted on the prior chest x-ray there is slightly more opacity at the left lung base suspicious for pneumonia.  The right lung base remains relatively well aerated.  No effusion is seen. Mediastinal contours are stable and heart size is stable.  IMPRESSION: Persistent left basilar opacity most consistent with pneumonia.  Original Report Authenticated By: Juline Patch, M.D.   Dg Chest Port 1 View  01/28/2011  *RADIOLOGY REPORT*  Clinical Data: History of recent pneumonia, follow-up  PORTABLE CHEST - 1 VIEW  Comparison: Chest x-ray of 01/26/2011 and CT chest of 01/26/2011.  Findings: There has been improvement in the opacity noted previously the right lung base.  However there is more opacity at the left lung base which may represent atelectasis or pneumonia. No effusion is seen.  Mediastinal contours are stable.  The heart is within upper limits of normal. No bony abnormality is seen.  IMPRESSION:  1.  Improvement right basilar opacity. 2.  Slightly more prominent markings at the left lung base. Possible pneumonia.  Original Report Authenticated By: Juline Patch, M.D.  :  Pathology: seminoma  Principal Problem:  *Neutropenic fever Active Problems:  Seminoma  Right leg DVT  ARF (acute renal failure)  PNA (pneumonia)  Thrombocytopenia :  Disposition: Dennis 60 yo [name] with hx of seminoma, s/p chemotherapy, 1 wk ago, with persistent svere neutropenia/thrombocytopenia. Nofevers. He has a dvt on lovenox, with some dose modification.. There is  difficulty getting plts today and I asked that they be held and lovenox  be continued. He does have thrush and i will start diflucan. He is full code.    will check LMW heparin level today to determine optimal dose of lovenoc pr

## 2011-01-29 NOTE — Progress Notes (Signed)
ANTICOAGULATION CONSULT NOTE - Follow Up Consult  Pharmacy Consult for Lovenox Indication: hx RLE DVT  No Known Allergies  Patient Measurements: Height: 6' (182.9 cm) Weight: 181 lb 7 oz (82.3 kg) IBW/kg (Calculated) : 77.6    Vital Signs: Temp: 99 F (37.2 C) (12/22 0800) Temp src: Oral (12/22 0800) BP: 121/68 mmHg (12/22 1000) Pulse Rate: 99  (12/22 1000)  Labs:  Basename 01/29/11 0418 01/28/11 0610 01/28/11 0310 01/27/11 0506 01/26/11 1616  HGB 9.3* -- 9.9* -- --  HCT 25.4* -- 27.1* 29.4* --  PLT 23*23* -- 23* 37* --  APTT 63* -- -- 54* --  LABPROT 15.5* -- -- 20.1* --  INR 1.20 -- -- 1.68* --  HEPARINUNFRC -- -- -- -- --  CREATININE 0.82 0.96 -- 1.19 --  CKTOTAL -- -- -- -- 58  CKMB -- -- -- -- 2.0  TROPONINI -- -- -- -- <0.30   Estimated Creatinine Clearance: 105.1 ml/min (by C-G formula based on Cr of 0.82).   Medications:  Scheduled:    . sodium chloride   Intravenous Once  . sodium chloride   Intravenous Once  . Boost Plus  237 mL Oral TID WC  . enoxaparin  100 mg Subcutaneous Q24H  . fluconazole (DIFLUCAN) IV  200 mg Intravenous Q24H  . piperacillin-tazobactam (ZOSYN)  IV  3.375 g Intravenous Q8H  . potassium chloride  10 mEq Intravenous Q1 Hr x 4  . potassium chloride  10 mEq Intravenous Q1 Hr x 4  . potassium chloride  10 mEq Intravenous Q1 Hr x 4  . potassium chloride  40 mEq Oral BID  . potassium chloride  40 mEq Oral BID  . sodium chloride  1,000 mL Intravenous Once  . vancomycin  1,000 mg Intravenous Q12H  . DISCONTD: allopurinol  300 mg Oral Daily  . DISCONTD: enoxaparin  100 mg Subcutaneous Q24H  . 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 Has been on 120mg  (1.5mg /kg) SQ q24h per home dose. MD has empirically reduced the dose to 100mg  q24h and would like to check a level to make sure dose is appropriate. Last 120mg  dose received at 2247 12/21. LMWH level must be drawn 4 hr after  dose is given to evaluate appropriately.  Pltc low d/t recent chemo, no bleeding reported. H/H stable.  Goal of Therapy:  LMWH level 1-2 units/ml 4 hour after dose admnistration   Plan:  Continue with Lovenox 100mg  sq q24h, dose at 2300. LWMH level at 3am 12/23. Will f/u level and adjust as necessary.  Gwen Her PharmD  [phone] 01/29/2011 11:45 AM

## 2011-01-29 NOTE — Progress Notes (Addendum)
Dennis [name] GUY:[id number],DGL:[id number] is a 60 y.o. male,  Outpatient Primary MD for the patient is [name], Dennis Ammons, MD, MD  Chief Complaint  Patient presents with  . Nausea  . Shortness of Breath        Subjective:   Dennis [name] today has, No headache, No chest pain, No abdominal pain - No Nausea, No new weakness tingling or numbness, much better Cough - SOB.    Objective:   Filed Vitals:   01/29/11 0200 01/29/11 0400 01/29/11 0600 01/29/11 0800  BP: 88/45  94/55   Pulse: 96  88   Temp:  98.6 F (37 C)  99 F (37.2 C)  TempSrc:  Oral  Oral  Resp: 30  30   Height:      Weight:   82.3 kg (181 lb 7 oz)   SpO2: 95%  97%     Wt Readings from Last 3 Encounters:  01/29/11 82.3 kg (181 lb 7 oz)  01/17/11 85.367 kg (188 lb 3.2 oz)  01/10/11 81.965 kg (180 lb 11.2 oz)     Intake/Output Summary (Last 24 hours) at 01/29/11 0838 Last data filed at 01/29/11 0600  Gross per 24 hour  Intake 5845.34 ml  Output   2225 ml  Net 3620.34 ml    Exam Awake Alert, Oriented *3, No new F.N deficits, Normal affect [place].AT,[name] Supple Neck,No JVD, No cervical lymphadenopathy appriciated.  Symmetrical Chest wall movement, Good air movement bilaterally, CTAB RRR,No Gallops,Rubs or new Murmurs, No Parasternal Heave +ve B.Sounds, Abd Soft, Non tender, No organomegaly appriciated, No rebound -guarding or rigidity. No Cyanosis, Clubbing or edema, No new Rash or bruise    Data Review  CBC  Lab 01/29/11 0418 01/28/11 0310 01/27/11 0506 01/26/11 1616 01/26/11 1359 01/24/11 1127  WBC 0.1* 0.1* 0.1* 0.1* < 0.2* --  HGB 9.3* 9.9* 10.7* 11.3* 13.1 --  HCT 25.4* 27.1* 29.4* 30.6* 37.5* --  PLT 23*23* 23* 37* 62* 82* --  MCV 83.3 83.1 83.8 84.3 90.8 --  MCH 30.5 30.4 30.5 31.1 31.7 --  MCHC 36.6* 36.5* 36.4* 36.9* 34.9 --  RDW 12.7 12.5 12.5 12.2 13.4 --  LYMPHSABS 0.0* 0.0* -- 0.0* 0.1* 0.1*  MONOABS 0.0* 0.0* -- 0.0* 0.0* 0.0*  EOSABS 0.0 0.0 -- 0.0 0.0 0.0  BASOSABS 0.0  0.0 -- 0.0 0.0 0.0  BANDABS -- -- -- -- -- --    Chemistries   Lab 01/29/11 0418 01/28/11 1245 01/28/11 0610 01/27/11 0506 01/26/11 1616 01/26/11 1359  NA 124* -- 123* 120* 114* 119*  K 3.1* 2.9* 2.6* 3.1* 3.6 --  CL 96 -- 90* 89* 83* 85*  CO2 20 -- 21 18* 21 23  GLUCOSE 74 -- 89 71 78 84  BUN 13 -- 14 16 23 23   CREATININE 0.82 -- 0.96 1.19 1.41* 1.56*  CALCIUM 7.6* -- 7.1* 7.5* 7.6* 8.5  MG -- 2.3 -- -- -- --  AST -- -- -- -- 14 --  ALT -- -- -- -- 12 --  ALKPHOS -- -- -- -- 68 --  BILITOT -- -- -- -- 1.3* --   ------------------------------------------------------------------------------------------------------------------ estimated creatinine clearance is 105.1 ml/min (by C-G formula based on Cr of 0.82). ------------------------------------------------------------------------------------------------------------------ No results found for this basename: HGBA1C:2 in the last 72 hours ------------------------------------------------------------------------------------------------------------------ No results found for this basename: CHOL:2,HDL:2,LDLCALC:2,TRIG:2,CHOLHDL:2,LDLDIRECT:2 in the last 72 hours ------------------------------------------------------------------------------------------------------------------ No results found for this basename: TSH,T4TOTAL,FREET3,T3FREE,THYROIDAB in the last 72 hours ------------------------------------------------------------------------------------------------------------------ No results found for this basename: VITAMINB12:2,FOLATE:2,FERRITIN:2,TIBC:2,IRON:2,RETICCTPCT:2 in the last  72 hours  Coagulation profile  Lab 01/29/11 0418 01/27/11 0506  INR 1.20 1.68*  PROTIME -- --     Basename 01/29/11 0418  DDIMER 2.83*    Cardiac Enzymes  Lab 01/26/11 1616  CKMB 2.0  TROPONINI <0.30  MYOGLOBIN --   ------------------------------------------------------------------------------------------------------------------ No  components found with this basename: POCBNP:3  Micro Results Recent Results (from the past 240 hour(s))  TECHNOLOGIST REVIEW     Status: Normal   Collection Time   01/26/11  1:59 PM      Component Value Range Status Comment   Technologist Review Occas Lymph, Rare Seg   Final   CULTURE, BLOOD (ROUTINE X 2)     Status: Normal (Preliminary result)   Collection Time   01/26/11  5:10 PM      Component Value Range Status Comment   Specimen Description BLOOD   Final    Special Requests BOTTLES DRAWN AEROBIC AND ANAEROBIC   Final    Setup Time [id number]   Final    Culture     Final    Value:        BLOOD CULTURE RECEIVED NO GROWTH TO DATE CULTURE WILL BE HELD FOR 5 DAYS BEFORE ISSUING A FINAL NEGATIVE REPORT   Report Status PENDING   Incomplete   CULTURE, BLOOD (ROUTINE X 2)     Status: Normal (Preliminary result)   Collection Time   01/26/11  5:19 PM      Component Value Range Status Comment   Specimen Description BLOOD RIGHT ARM   Final    Special Requests BOTTLES DRAWN AEROBIC AND ANAEROBIC 5CC   Final    Setup Time [id number]   Final    Culture     Final    Value:        BLOOD CULTURE RECEIVED NO GROWTH TO DATE CULTURE WILL BE HELD FOR 5 DAYS BEFORE ISSUING A FINAL NEGATIVE REPORT   Report Status PENDING   Incomplete   URINE CULTURE     Status: Normal   Collection Time   01/26/11  7:21 PM      Component Value Range Status Comment   Specimen Description URINE, CLEAN CATCH   Final    Special Requests NONE   Final    Setup Time [id number]   Final    Colony Count NO GROWTH   Final    Culture NO GROWTH   Final    Report Status 01/27/2011 FINAL   Final   CULTURE, BLOOD (ROUTINE X 2)     Status: Normal (Preliminary result)   Collection Time   01/26/11  9:40 PM      Component Value Range Status Comment   Specimen Description BLOOD RIGHT HAND   Final    Special Requests BOTTLES DRAWN AEROBIC AND ANAEROBIC 3CC   Final    Setup Time [id number]   Final    Culture     Final     Value:        BLOOD CULTURE RECEIVED NO GROWTH TO DATE CULTURE WILL BE HELD FOR 5 DAYS BEFORE ISSUING A FINAL NEGATIVE REPORT   Report Status PENDING   Incomplete   CULTURE, BLOOD (ROUTINE X 2)     Status: Normal (Preliminary result)   Collection Time   01/26/11  9:45 PM      Component Value Range Status Comment   Specimen Description BLOOD RIGHT HAND   Final    Special Requests BOTTLES DRAWN AEROBIC ONLY 3CC   Final  Setup Time [id number]   Final    Culture     Final    Value:        BLOOD CULTURE RECEIVED NO GROWTH TO DATE CULTURE WILL BE HELD FOR 5 DAYS BEFORE ISSUING A FINAL NEGATIVE REPORT   Report Status PENDING   Incomplete   MRSA PCR SCREENING     Status: Normal   Collection Time   01/27/11  9:39 PM      Component Value Range Status Comment   MRSA by PCR NEGATIVE  NEGATIVE  Final   CLOSTRIDIUM DIFFICILE BY PCR     Status: Normal   Collection Time   01/28/11  3:50 AM      Component Value Range Status Comment   C difficile by pcr NEGATIVE  NEGATIVE  Final     Radiology Reports Dg Chest 2 View  01/26/2011  *RADIOLOGY REPORT*  Clinical Data: Shortness of breath  CHEST - 2 VIEW  Comparison: 12/10/2010  Findings: Cardiomediastinal silhouette is stable.  Mild thoracic spine osteopenia.  Hazy airspace disease right base suspicious for infiltrate.  Trace left basilar atelectasis or infiltrate.  No pulmonary edema.  IMPRESSION: Hazy airspace disease right base suspicious for infiltrate.  Trace left basilar atelectasis or infiltrate.  No pulmonary edema.  Original Report Authenticated By: Natasha Mead, M.D.   Ct Angio Chest W/cm &/or Wo Cm  01/26/2011  *RADIOLOGY REPORT*  Clinical Data:  Shortness of breath, recent DVT, rule out pulmonary embolus  CT ANGIOGRAPHY CHEST WITH CONTRAST  Technique:  Multidetector CT imaging of the chest was performed using the standard protocol during bolus administration of intravenous contrast.  Multiplanar CT image reconstructions including MIPs were  obtained to evaluate the vascular anatomy.  Contrast: 90mL OMNIPAQUE IOHEXOL 300 MG/ML IV SOLN  Comparison:  12/10/2010  Findings:  Images of the thoracic inlet are unremarkable.  Central airways are patent.  Mild atherosclerotic calcifications of thoracic aorta.  No hilar or mediastinal adenopathy.  Sagittal images of the spine shows mild degenerative changes thoracic spine.  No destructive bony lesions are noted.  Sagittal view of the sternum is unremarkable.  No pulmonary embolus is noted.  Images of the lung parenchyma shows no pulmonary edema.  In axial image 71 there is  small amount of consolidation in the right middle lobe suspicious for infiltrate.  Small amount of triangular- shaped consolidation noted in the left base anteriorly with air bronchogram suspicious for atelectasis or infiltrate.  Mild bronchial thickening noted in the right middle lobe.  The previous retrocrural adenopathy has decreased in size. Measures 3.1 x 2 cm on the prior exam measures 4.5 x 3.1 cm.  Heart size is within normal limits.  Atherosclerotic calcifications of the coronary arteries are noted.  Review of the MIP images confirms the above findings.\   IMPRESSION:  1.  No pulmonary embolus. 2.  There is a small amount of consolidation with air bronchogram in the right middle lobe suspicious for infiltrate.  Small amount of atelectasis or infiltrate noted in the left base anteriorly. 3.  No hilar or mediastinal adenopathy. 4.  Decrease in size of retrocrural adenopathy measures 3.1 x 2 cm. On prior exam measures 4.5 x 3.1 cm. 5.  No pulmonary edema.  Original Report Authenticated By: Natasha Mead, M.D.    Scheduled Meds:    . sodium chloride   Intravenous Once  . sodium chloride   Intravenous Once  . Boost Plus  237 mL Oral TID WC  . enoxaparin  120 mg Subcutaneous Q24H  . magnesium sulfate 1 - 4 g bolus IVPB  1 g Intravenous Once  . piperacillin-tazobactam (ZOSYN)  IV  3.375 g Intravenous Q8H  . potassium chloride  10  mEq Intravenous Q1 Hr x 4  . potassium chloride  10 mEq Intravenous Q1 Hr x 4  . potassium chloride  10 mEq Intravenous Q1 Hr x 4  . potassium chloride  40 mEq Oral BID  . potassium chloride  40 mEq Oral BID  . sodium chloride  1,000 mL Intravenous Once  . vancomycin  1,000 mg Intravenous Q12H  . DISCONTD: allopurinol  300 mg Oral Daily   Continuous Infusions:    . sodium chloride    . 0.9 % sodium chloride with kcl 125 mL/hr at 01/29/11 0600   PRN Meds:.acetaminophen, albuterol, loperamide, morphine, oxyCODONE, prochlorperazine, prochlorperazine, DISCONTD: sodium chloride  Assessment & Plan   1.Nosocomial PNA - Neutropenic due to Chemo for [place] , SIRs -  Continue ABX  for now, improved clinically, afebrile,  ABX D/W Dr Orvan Falconer 01-27-11, cultures remain -ve, HaemOnc following. Neut precautions per protocol. IVF continue, 1 more bolus today monitor BP  In SDU. PCCM following via [facility].   2. ARF (acute renal failure) with hyponatremia  - prerenal in etiology  - resolved with IVF.   3. PNA (pneumonia) -From #1 above, as #1, in SDU, better.   4. Right leg DVT in the setting of Thrombocytopenia - continue Lovenox per Haem Onc despite low plts, pharmacy dosing, will give 2 units Plts today, lovenox dose held till plts  given.   5.Hyponatremia - Appears pre renal - Ur Na 23, IVF contiue, improving, Gatorade PO.   6.Hypokalemia - replace IV and PO, check K and Mag in am.   7.Chr Diarrhea - -ve C diff. PRN imodium.   DVT Prophylaxis  Lovenox    See all Orders from today for further details  Prognosis guarded   Leroy Sea M.D  01/29/2011, 8:38 AM  [facility] Office  [phone]

## 2011-01-29 NOTE — Progress Notes (Signed)
ANTIBIOTIC CONSULT NOTE - FOLLOW UP  Pharmacy Consult for Vanc/Zosyn Indication: Neutropenia/HCAP  No Known Allergies  Patient Measurements: Height: 6' (182.9 cm) Weight: 181 lb 7 oz (82.3 kg) IBW/kg (Calculated) : 77.6    Vital Signs: Temp: 99 F (37.2 C) (12/22 0800) Temp src: Oral (12/22 0800) BP: 121/68 mmHg (12/22 1000) Pulse Rate: 99  (12/22 1000) Intake/Output from previous day: 12/21 0701 - 12/22 0700 In: 5945.3 [P.O.:515; I.V.:1283.3; Blood:297; IV Piggyback:3850] Out: 2476 [Urine:2175; Stool:301] Intake/Output from this shift: Total I/O In: 565 [P.O.:340; IV Piggyback:225] Out: 300 [Urine:300]  Labs:  [facility] 01/29/11 0418 01/28/11 0610 01/28/11 0310 01/27/11 0506  WBC 0.1* -- 0.1* 0.1*  HGB 9.3* -- 9.9* 10.7*  PLT 23*23* -- 23* 37*  LABCREA -- -- -- --  CREATININE 0.82 0.96 -- 1.19   Estimated Creatinine Clearance: 105.1 ml/min (by C-G formula based on Cr of 0.82). No results found for this basename: VANCOTROUGH:2,VANCOPEAK:2,VANCORANDOM:2,GENTTROUGH:2,GENTPEAK:2,GENTRANDOM:2,TOBRATROUGH:2,TOBRAPEAK:2,TOBRARND:2,AMIKACINPEAK:2,AMIKACINTROU:2,AMIKACIN:2, in the last 72 hours   Microbiology: Recent Results (from the past 720 hour(s))  TECHNOLOGIST REVIEW     Status: Normal   Collection Time   01/26/11  1:59 PM      Component Value Range Status Comment   Technologist Review Occas Lymph, Rare Seg   Final   CULTURE, BLOOD (ROUTINE X 2)     Status: Normal (Preliminary result)   Collection Time   01/26/11  5:10 PM      Component Value Range Status Comment   Specimen Description BLOOD   Final    Special Requests BOTTLES DRAWN AEROBIC AND ANAEROBIC   Final    Setup Time 201212200214   Final    Culture     Final    Value:        BLOOD CULTURE RECEIVED NO GROWTH TO DATE CULTURE WILL BE HELD FOR 5 DAYS BEFORE ISSUING A FINAL NEGATIVE REPORT   Report Status PENDING   Incomplete   CULTURE, BLOOD (ROUTINE X 2)     Status: Normal (Preliminary result)   Collection Time   01/26/11  5:19 PM      Component Value Range Status Comment   Specimen Description BLOOD RIGHT ARM   Final    Special Requests BOTTLES DRAWN AEROBIC AND ANAEROBIC 5CC   Final    Setup Time [id number]   Final    Culture     Final    Value:        BLOOD CULTURE RECEIVED NO GROWTH TO DATE CULTURE WILL BE HELD FOR 5 DAYS BEFORE ISSUING A FINAL NEGATIVE REPORT   Report Status PENDING   Incomplete   URINE CULTURE     Status: Normal   Collection Time   01/26/11  7:21 PM      Component Value Range Status Comment   Specimen Description URINE, CLEAN CATCH   Final    Special Requests NONE   Final    Setup Time [id number]   Final    Colony Count NO GROWTH   Final    Culture NO GROWTH   Final    Report Status 01/27/2011 FINAL   Final   CULTURE, BLOOD (ROUTINE X 2)     Status: Normal (Preliminary result)   Collection Time   01/26/11  9:40 PM      Component Value Range Status Comment   Specimen Description BLOOD RIGHT HAND   Final    Special Requests BOTTLES DRAWN AEROBIC AND ANAEROBIC 3CC   Final    Setup Time [id number]   Final  Culture     Final    Value:        BLOOD CULTURE RECEIVED NO GROWTH TO DATE CULTURE WILL BE HELD FOR 5 DAYS BEFORE ISSUING A FINAL NEGATIVE REPORT   Report Status PENDING   Incomplete   CULTURE, BLOOD (ROUTINE X 2)     Status: Normal (Preliminary result)   Collection Time   01/26/11  9:45 PM      Component Value Range Status Comment   Specimen Description BLOOD RIGHT HAND   Final    Special Requests BOTTLES DRAWN AEROBIC ONLY 3CC   Final    Setup Time [id number]   Final    Culture     Final    Value:        BLOOD CULTURE RECEIVED NO GROWTH TO DATE CULTURE WILL BE HELD FOR 5 DAYS BEFORE ISSUING A FINAL NEGATIVE REPORT   Report Status PENDING   Incomplete   MRSA PCR SCREENING     Status: Normal   Collection Time   01/27/11  9:39 PM      Component Value Range Status Comment   MRSA by PCR NEGATIVE  NEGATIVE  Final   CLOSTRIDIUM DIFFICILE  BY PCR     Status: Normal   Collection Time   01/28/11  3:50 AM      Component Value Range Status Comment   C difficile by pcr NEGATIVE  NEGATIVE  Final     Anti-infectives     Start     Dose/Rate Route Frequency Ordered Stop   01/29/11 1200   fluconazole (DIFLUCAN) IVPB 200 mg        200 mg 100 mL/hr over 60 Minutes Intravenous Every 24 hours 01/29/11 1009     01/27/11 0815   ciprofloxacin (CIPRO) IVPB 400 mg  Status:  Discontinued        400 mg 200 mL/hr over 60 Minutes Intravenous Every 12 hours 01/27/11 0802 01/27/11 0956   01/27/11 0600   vancomycin (VANCOCIN) IVPB 1000 mg/200 mL premix        1,000 mg 200 mL/hr over 60 Minutes Intravenous Every 12 hours 01/26/11 2119     01/27/11 0200  piperacillin-tazobactam (ZOSYN) IVPB 3.375 g       3.375 g 12.5 mL/hr over 240 Minutes Intravenous Every 8 hours 01/26/11 2119     01/26/11 2200   piperacillin-tazobactam (ZOSYN) IVPB 2.25 g  Status:  Discontinued        2.25 g 100 mL/hr over 30 Minutes Intravenous 3 times per day 01/26/11 2104 01/26/11 2118   01/26/11 2115   vancomycin (VANCOCIN) IVPB 1000 mg/200 mL premix  Status:  Discontinued        1,000 mg 200 mL/hr over 60 Minutes Intravenous Every 12 hours 01/26/11 2104 01/26/11 2108   01/26/11 1715   vancomycin (VANCOCIN) IVPB 1000 mg/200 mL premix        1,000 mg 200 mL/hr over 60 Minutes Intravenous To Emergency Dept 01/26/11 1708 01/26/11 1833   01/26/11 1715  piperacillin-tazobactam (ZOSYN) IVPB 4.5 g       4.5 g 200 mL/hr over 30 Minutes Intravenous To Emergency Dept 01/26/11 1708 01/26/11 1919          Assessment: D4 Vancomycin 1g IV q12h/Zosyn extended infusion 3.375g IV q8h. Scr down wnl and stable. Tm 99. Still neutropenic. Cx negative.   Goal of Therapy:  Vancomycin trough level 15-20 mcg/ml Appropriate renal dosing of Zosyn  Plan:  Will increase Vancomycin to 1g IV q8h  given improvement in SCr. No change to Zosyn (dose appropriate still). Will plan to  check vancomycin trough at steady state and adjust dose as necessary.   Gwen Her PharmD  [phone] 01/29/2011 11:59 AM

## 2011-01-30 ENCOUNTER — Inpatient Hospital Stay (HOSPITAL_COMMUNITY): Payer: 59

## 2011-01-30 ENCOUNTER — Other Ambulatory Visit: Payer: Self-pay

## 2011-01-30 DIAGNOSIS — J810 Acute pulmonary edema: Secondary | ICD-10-CM

## 2011-01-30 DIAGNOSIS — J9600 Acute respiratory failure, unspecified whether with hypoxia or hypercapnia: Secondary | ICD-10-CM

## 2011-01-30 LAB — CBC
Hemoglobin: 9.3 g/dL — ABNORMAL LOW (ref 13.0–17.0)
Hemoglobin: 9.4 g/dL — ABNORMAL LOW (ref 13.0–17.0)
MCH: 30.7 pg (ref 26.0–34.0)
MCH: 30.6 pg (ref 26.0–34.0)
MCHC: 36.8 g/dL — ABNORMAL HIGH (ref 30.0–36.0)
MCHC: 36.7 g/dL — ABNORMAL HIGH (ref 30.0–36.0)
RDW: 12.7 % (ref 11.5–15.5)

## 2011-01-30 LAB — OSMOLALITY, URINE: Osmolality, Ur: 375 mOsm/kg — ABNORMAL LOW (ref 390–1090)

## 2011-01-30 LAB — BASIC METABOLIC PANEL
BUN: 11 mg/dL (ref 6–23)
Calcium: 8 mg/dL — ABNORMAL LOW (ref 8.4–10.5)
Creatinine, Ser: 0.8 mg/dL (ref 0.50–1.35)
GFR calc non Af Amer: >90 mL/min (ref >90)
GFR calc non Af Amer: >90 mL/min (ref >90)
Glucose, Bld: 71 mg/dL (ref 70–99)
Glucose, Bld: 152 mg/dL — ABNORMAL HIGH (ref 70–99)
Potassium: 3 mEq/L — ABNORMAL LOW (ref 3.5–5.1)
Sodium: 126 mEq/L — ABNORMAL LOW (ref 135–145)

## 2011-01-30 LAB — DIFFERENTIAL
Band Neutrophils: 0 % (ref 0–10)
Basophils Absolute: 0 10*3/uL (ref 0.0–0.1)
Eosinophils Absolute: 0 10*3/uL (ref 0.0–0.7)
Lymphs Abs: 0 10*3/uL — ABNORMAL LOW (ref 0.7–4.0)
Monocytes Absolute: 0 10*3/uL — ABNORMAL LOW (ref 0.1–1.0)
Monocytes Relative: 0 % — ABNORMAL LOW (ref 3–12)
nRBC: 0 /100 WBC

## 2011-01-30 LAB — CARDIAC PANEL(CRET KIN+CKTOT+MB+TROPI)
CK, MB: 1.7 ng/mL (ref 0.3–4.0)
Total CK: 11 U/L (ref 7–232)

## 2011-01-30 LAB — RENAL FUNCTION PANEL
Albumin: 2 g/dL — ABNORMAL LOW (ref 3.5–5.2)
CO2: 21 mEq/L (ref 19–32)
Calcium: 8.1 mg/dL — ABNORMAL LOW (ref 8.4–10.5)
Creatinine, Ser: 0.83 mg/dL (ref 0.50–1.35)
GFR calc non Af Amer: >90 mL/min (ref >90)
Phosphorus: 1.1 mg/dL — ABNORMAL LOW (ref 2.3–4.6)

## 2011-01-30 LAB — MAGNESIUM: Magnesium: 1.9 mg/dL (ref 1.5–2.5)

## 2011-01-30 LAB — HEPARIN ANTI-XA: Heparin LMW: 0.51 IU/mL

## 2011-01-30 MED ORDER — ENOXAPARIN SODIUM 120 MG/0.8ML ~~LOC~~ SOLN
120.0000 mg | SUBCUTANEOUS | Status: DC
  Administered 2011-01-30 – 2011-02-06 (×8): 120 mg via SUBCUTANEOUS
  Filled 2011-01-30 (×10): qty 0.8

## 2011-01-30 MED ORDER — MORPHINE SULFATE 2 MG/ML IJ SOLN
1.0000 mg | INTRAMUSCULAR | Status: DC | PRN
  Administered 2011-01-30 – 2011-01-31 (×4): 1 mg via INTRAVENOUS
  Filled 2011-01-30 (×3): qty 1

## 2011-01-30 MED ORDER — POTASSIUM CHLORIDE CRYS ER 20 MEQ PO TBCR
40.0000 meq | EXTENDED_RELEASE_TABLET | Freq: Three times a day (TID) | ORAL | Status: AC
  Administered 2011-01-30 (×3): 40 meq via ORAL

## 2011-01-30 MED ORDER — IOHEXOL 300 MG/ML  SOLN: 80.0000 mL | Freq: Once | INTRAMUSCULAR | Status: AC | PRN

## 2011-01-30 MED ORDER — POTASSIUM CHLORIDE 10 MEQ/100ML IV SOLN
INTRAVENOUS | Status: AC
  Administered 2011-01-30: 10 meq via INTRAVENOUS
  Filled 2011-01-30: qty 100

## 2011-01-30 MED ORDER — POTASSIUM CHLORIDE CRYS ER 20 MEQ PO TBCR
40.0000 meq | EXTENDED_RELEASE_TABLET | Freq: Two times a day (BID) | ORAL | Status: DC
  Administered 2011-01-30 – 2011-02-04 (×11): 40 meq via ORAL
  Administered 2011-02-05: 20 meq via ORAL
  Administered 2011-02-05 – 2011-02-06 (×2): 40 meq via ORAL
  Filled 2011-01-30 (×15): qty 2

## 2011-01-30 MED ORDER — POTASSIUM CHLORIDE 10 MEQ/100ML IV SOLN
10.0000 meq | INTRAVENOUS | Status: AC
  Administered 2011-01-30 (×4): 10 meq via INTRAVENOUS
  Filled 2011-01-30 (×4): qty 100

## 2011-01-30 MED ORDER — POTASSIUM CHLORIDE 10 MEQ/100ML IV SOLN
10.0000 meq | INTRAVENOUS | Status: AC
  Administered 2011-01-30 (×2): 10 meq via INTRAVENOUS

## 2011-01-30 MED ORDER — FUROSEMIDE 10 MG/ML IJ SOLN
20.0000 mg | Freq: Once | INTRAMUSCULAR | Status: AC
  Administered 2011-01-30: 20 mg via INTRAVENOUS
  Filled 2011-01-30: qty 2

## 2011-01-30 MED ORDER — MAGNESIUM SULFATE IN D5W 10-5 MG/ML-% IV SOLN
1.0000 g | Freq: Once | INTRAVENOUS | Status: AC
  Administered 2011-01-30: 1 g via INTRAVENOUS
  Filled 2011-01-30: qty 100

## 2011-01-30 MED ORDER — MAGNESIUM SULFATE 50 % IJ SOLN: 1.0000 g | Freq: Once | INTRAMUSCULAR | Status: DC

## 2011-01-30 MED ORDER — FLUCONAZOLE 100 MG PO TABS
100.0000 mg | ORAL_TABLET | Freq: Every day | ORAL | Status: DC
  Administered 2011-01-30 – 2011-02-07 (×9): 100 mg via ORAL
  Filled 2011-01-30 (×10): qty 1

## 2011-01-30 MED ORDER — SODIUM CHLORIDE 0.9 % IV SOLN
INTRAVENOUS | Status: DC
  Administered 2011-01-30: 07:00:00 via INTRAVENOUS
  Filled 2011-01-30 (×2): qty 1000

## 2011-01-30 MED ORDER — HYDROCORTISONE SOD SUCCINATE 100 MG IJ SOLR
100.0000 mg | Freq: Three times a day (TID) | INTRAMUSCULAR | Status: DC
  Administered 2011-01-30 – 2011-02-01 (×6): 100 mg via INTRAVENOUS
  Filled 2011-01-30 (×9): qty 2

## 2011-01-30 MED ORDER — VANCOMYCIN HCL 1000 MG IV SOLR
1250.0000 mg | Freq: Two times a day (BID) | INTRAVENOUS | Status: DC
  Administered 2011-01-31 – 2011-02-06 (×13): 1250 mg via INTRAVENOUS
  Filled 2011-01-30 (×16): qty 1250

## 2011-01-30 MED ORDER — FENTANYL 12 MCG/HR TD PT72
12.5000 ug | MEDICATED_PATCH | TRANSDERMAL | Status: DC
  Administered 2011-01-30 – 2011-02-05 (×3): 12.5 ug via TRANSDERMAL
  Filled 2011-01-30 (×2): qty 1

## 2011-01-30 NOTE — Progress Notes (Signed)
Subjective: pt. Supine in bed, unable to complete sentences because of SOB, no cough or phlegm, has pleuritic pain in the R upper chest ("pain in my heart when I breathe"); denies nausea or vomiting, constipation, headache or visual changes;  Objective:  Anxious white male in respiratory distress Filed Vitals:   01/30/11 0800  BP:   Pulse:   Temp: 99 F (37.2 C)  Resp:   Body mass index is 25.09 kg/(m^2).  PHYSICAL EXAM: Wheezes Right upper lung, auscultated anteriorly Heart regular rhythm, tachycardic Positive bowel sounds  IN - OUT past 48 hours c. 4 L, now diuresing well  Lab Results:  Basename 01/30/11 0318 01/29/11 0418  WBC 0.2* 0.1*  HGB 9.3* 9.3*  HCT 25.3* 25.4*  PLT 26* 23*23*    BMET    Component Value Date/Time   NA 125* 01/30/2011 0318   K 3.0* 01/30/2011 0318   CL 97 01/30/2011 0318   CO2 20 01/30/2011 0318   GLUCOSE 71 01/30/2011 0318   BUN 11 01/30/2011 0318   CREATININE 0.80 01/30/2011 0318   CALCIUM 7.6* 01/30/2011 0318   GFRNONAA >90 01/30/2011 0318   GFRAA >90 01/30/2011 0318   MICRO: urine and blood cultures negative to date    Studies/Results: Dg Chest 1 View  01/29/2011  *RADIOLOGY REPORT*  Clinical Data: Pneumonia, follow-up  CHEST - 1 VIEW  Comparison: Portable chest x-ray of 01/28/2011  Findings: As noted on the prior chest x-ray there is slightly more opacity at the left lung base suspicious for pneumonia.  The right lung base remains relatively well aerated.  No effusion is seen. Mediastinal contours are stable and heart size is stable.  IMPRESSION: Persistent left basilar opacity most consistent with pneumonia.  Original Report Authenticated By: Juline Patch, M.D.   Dg Chest Port 1 View  01/30/2011  *RADIOLOGY REPORT*  Clinical Data: Shortness of breath.  PORTABLE CHEST - 1 VIEW  Comparison: 01/29/2011  Findings: Worsening patchy bilateral airspace disease, including the right upper lobe.  Heart is normal size.  No effusions.  No  acute bony abnormality.  IMPRESSION: Worsening patchy bilateral airspace disease.  Given the appearance, distribution and normal heart size, I favor multifocal pneumonia. Atypical (noncardiogenic) edema is possible but felt less likely.  Original Report Authenticated By: Cyndie Chime, M.D.    Current Facility-Administered Medications  Medication Dose Route Frequency Provider Last Rate Last Dose  . acetaminophen (TYLENOL) tablet 650 mg  650 mg Oral Q4H PRN Debby Crosley, MD   650 mg at 01/28/11 1215  . albuterol (PROVENTIL) (5 MG/ML) 0.5% nebulizer solution 2.5 mg  2.5 mg Nebulization Q2H PRN Iskra Magick-Myers   2.5 mg at 01/27/11 0950  . Boost Plus (BOOST Plus) liquid 237 mL  237 mL Oral TID WC Jeoffrey Massed, RD   237 mL at 01/30/11 0800  . enoxaparin (LOVENOX) injection 120 mg  120 mg Subcutaneous Q24H Gwen Her, PHARMD      . fluconazole (DIFLUCAN) IVPB 200 mg  200 mg Intravenous Q24H Pierce Crane, MD   200 mg at 01/29/11 1307  . furosemide (LASIX) injection 20 mg  20 mg Intravenous Once Leroy Sea, MD   20 mg at 01/30/11 0835  . hydrocortisone sodium succinate (SOLU-CORTEF) injection 100 mg  100 mg Intravenous Q8H Leroy Sea, MD   100 mg at 01/30/11 0932  . loperamide (IMODIUM) capsule 2 mg  2 mg Oral Q6H PRN Leroy Sea, MD      . magnesium  sulfate IVPB 1 g 100 mL  1 g Intravenous Once Leroy Sea, MD   1 g at 01/30/11 0640  . morphine 2 MG/ML injection 1 mg  1 mg Intravenous Q4H PRN Iskra Magick-Myers      . oxyCODONE (Oxy IR/ROXICODONE) immediate release tablet 5 mg  5 mg Oral Q4H PRN Iskra Magick-Myers      . piperacillin-tazobactam (ZOSYN) IVPB 3.375 g  3.375 g Intravenous Q8H Leroy Sea, MD   3.375 g at 01/30/11 0933  . potassium chloride 10 mEq in 100 mL IVPB  10 mEq Intravenous Q1 Hr x 4 Leroy Sea, MD   10 mEq at 01/29/11 1306  . potassium chloride 10 mEq in 100 mL IVPB  10 mEq Intravenous Once Berkley Harvey, PHARMD   10 mEq at  01/29/11 1519  . potassium chloride 10 mEq in 100 mL IVPB  10 mEq Intravenous Q1 Hr x 4 Leroy Sea, MD   10 mEq at 01/30/11 0933  . potassium chloride SA (K-DUR,KLOR-CON) CR tablet 40 mEq  40 mEq Oral BID Leroy Sea, MD   40 mEq at 01/29/11 2246  . potassium chloride SA (K-DUR,KLOR-CON) CR tablet 40 mEq  40 mEq Oral BID Leroy Sea, MD   40 mEq at 01/30/11 1610  . potassium chloride SA (K-DUR,KLOR-CON) CR tablet 40 mEq  40 mEq Oral TID Leroy Sea, MD   40 mEq at 01/30/11 0932  . prochlorperazine (COMPAZINE) suppository 25 mg  25 mg Rectal Q12H PRN Iskra Magick-Myers      . prochlorperazine (COMPAZINE) tablet 10 mg  10 mg Oral Q6H PRN Iskra Magick-Myers      . vancomycin (VANCOCIN) IVPB 1000 mg/200 mL premix  1,000 mg Intravenous Q8H Gwen Her, PHARMD   1,000 mg at 01/30/11 0549  . DISCONTD: enoxaparin (LOVENOX) injection 100 mg  100 mg Subcutaneous Q24H Pierce Crane, MD      . DISCONTD: enoxaparin (LOVENOX) injection 100 mg  100 mg Subcutaneous Q24H Pierce Crane, MD   100 mg at 01/29/11 2246  . DISCONTD: enoxaparin (LOVENOX) injection 120 mg  120 mg Subcutaneous Q24H Iskra Magick-Myers   120 mg at 01/28/11 2247  . DISCONTD: magnesium sulfate (IV Push/IM) injection 1 g  1 g Intravenous Once Leroy Sea, MD      . DISCONTD: potassium chloride SA (K-DUR,KLOR-CON) CR tablet 40 mEq  40 mEq Oral BID Leroy Sea, MD   40 mEq at 01/28/11 2246  . DISCONTD: sodium chloride 0.9 % 1,000 mL with potassium chloride 10 mEq infusion   Intravenous Continuous Leroy Sea, MD 75 mL/hr at 01/30/11 0019    . DISCONTD: sodium chloride 0.9 % 1,000 mL with potassium chloride 20 mEq infusion   Intravenous Continuous Leroy Sea, MD 75 mL/hr at 01/30/11 0640    . DISCONTD: vancomycin (VANCOCIN) IVPB 1000 mg/200 mL premix  1,000 mg Intravenous Q12H Leroy Sea, MD   1,000 mg at 01/29/11 9604      Assessment: 60 year-old [place] man day 14 cycle 1 cisplatin/etoposide  for stage IIC seminoma, received neulasta 12/15, now admitted with febrile neutropenia, pneumonia, RLE DVT   Plan: continue zosyn/vanco/diflucan; minimize IVF; replace potassium, magnesium; check urine electrolytes; diurese at tolerated; continue lovenox; recheck CBC, BMET this afternoon.  Discussed overall situation with patient. His cancer is eminently curable. He is a full code.    LOS: 4 days        Lowella Dell, MD 01/30/2011,  9:49 AM

## 2011-01-30 NOTE — Consult Note (Signed)
Name: Dennis [name] MRN: [id number] DOB: Nov 03, 1950    LOS: 4  PCCM ADMISSION NOTE  Brief Profile: 60 yo wm smoker with met seminoma on chem 4 d pta complicated by DVT one week pta on lovenox  admit 12/19 with sob and fatigue N and V found to have neutopenic fever and possible sepsis rx with abx/fluids but more sob with increasing infiltrates am 12/23 so ccm asked to evaluate  Lines / Drains:    Cultures: Urine 12/19 > neg BC x 4  12/19 >>> C diff 12/21  > neg  Antibiotics: Zosyn (neutropenic sepsis) 12/19 >>> Vanc (neutropenic sepsis) 12/19 >>> Fluconazole (neutropenic sepsis) 12/22>>    Tests / Events:    Hx: 60 yowm smoker  with 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 Arline Asp and has received chemotherapy 4 d pta >  12/19 to [facility] ED with main concern of progressively worsening shortness of breath since receiving chemo associated with fatigue, nausea, vomiting, poor oral intake, diarrhea  approximately 1 week pta  he was diagnosed with DVT and has been started on Lovenox injections. cc fever, chills, but no chest pain, no specific urinary concerns, no headaches or visual changes, no neck stiffness, no known sick contacts or exposures.   Past Medical History  Diagnosis Date  . Cancer     right testicle, behind stomach   Past Surgical History  Procedure Date  . Appendectomy    Prior to Admission medications   Medication Sig Start Date End Date Taking? Authorizing Provider  allopurinol (ZYLOPRIM) 300 MG tablet Take 1 tablet (300 mg total) by mouth daily. 01/13/11 01/13/12 Yes Gerarda Fraction Murinson, MD  enoxaparin (LOVENOX) 120 MG/0.8ML SOLN Inject 0.8 mLs (120 mg total) into the skin daily. 01/18/11  Yes Samul Dada, MD  prochlorperazine (COMPAZINE) 10 MG tablet Take 10 mg by mouth every 6 (six) hours as needed. nausea    Yes Historical Provider, MD  prochlorperazine (COMPAZINE) 25 MG suppository Place 25 mg rectally every 12 (twelve) hours as needed.  Nausea     Yes Historical Provider, MD   Allergies No Known Allergies  Family History History reviewed. No pertinent family history.  Social History  reports that he has been smoking Cigarettes.  He has been smoking about 1 pack per day. He has never used smokeless tobacco. He reports that he drinks about one ounce of alcohol per week. He reports that he does not use illicit drugs.  Review Of Systems  11 points review of systems is negative with an exception of listed in HPI.  Vital Signs: Temp:  [98.7 F (37.1 C)-99.9 F (37.7 C)] 99 F (37.2 C) (12/23 0800) Pulse Rate:  [94-111] 111  (12/23 0600) Resp:  [26-36] 28  (12/23 0600) BP: (99-137)/(45-76) 137/76 mmHg (12/23 0600) SpO2:  [91 %-97 %] 92 % (12/23 0600) Weight:  [184 lb 15.5 oz (83.9 kg)] 184 lb 15.5 oz (83.9 kg) (12/23 0400) I/O last 3 completed shifts: In: 5197 [P.O.:1300; I.V.:1825; Blood:297; IV Piggyback:1775] Out: 3300 [Urine:3300]  Physical Examination: Anxious wm who answer a single question asked in a straightforward manner, tending to go off on tangents or answer questions with ambiguous medical terms or diagnoses " I have pneumonia"  " I have heart pain"  sats mid 90s on 3lpm np  HEENT mild turbinate edema.  Oropharynx no thrush or excess pnd or cobblestoning.  No JVD or cervical adenopathy. Mild accessory muscle hypertrophy. Trachea midline, nl thryroid. Chest was minimally hyperinflated  by percussion with diminished breath sounds and very minimal increased exp time without wheeze. Hoover sign positive at very end of  inspiration. Regular rate and rhythm without murmur gallop or rub or increase P2  Abd: no hsm, nl excursion. Ext warm without cyanosis or clubbing.  RLE tensely swollen      Labs :  Lab 01/30/11 0318 01/29/11 0418 01/28/11 1245 01/28/11 0610  NA 125* 124* -- 123*  K 3.0* 3.1* 2.9* --  CL 97 96 -- 90*  CO2 20 20 -- 21  BUN 11 13 -- 14  CREATININE 0.80 0.82 -- 0.96  GLUCOSE 71 74 -- 89     Lab 01/30/11 0318 01/29/11 0418 01/28/11 0310  HGB 9.3* 9.3* 9.9*  HCT 25.3* 25.4* 27.1*  WBC 0.2* 0.1* 0.1*  PLT 26* 23*23* 23*     pcxr 12/23 Worsening patchy bilateral airspace disease. Given the appearance,  distribution and normal heart size, I favor multifocal pneumonia.  Atypical (noncardiogenic) edema is possible but felt less likely.   Assessment and Plan:  Acute resp failure/ 02 dep c/w pulmonary edema with ALI and ? Cardiogenic/vol overload component - Agree with plans to reduce IV fluids/ diuresis -No indication for escalation of care at present  Neutropenic sepsis - Agree with broad abx - Rec check cortisol level and start [facility] 12/23  Severe Thrombocyctopenia Lab Results  Component Value Date   PLT 26* 01/30/2011   PLT 23* 01/29/2011   PLT 23* 01/29/2011   PLT 23* 01/28/2011   PLT 82* 01/26/2011   PLT 215 01/24/2011   PLT 300 01/21/2011   rx per heme/onc  R DVT rx per Heme onc, no obvious PE ? If R leg is obstructed by tumor?   Best practices / Disposition: -->ICU status appropriate -->full code --> LMWM  For DVT Rx -->Protonix for GI Px  -->father updated at bedside  The patient is critically ill with multiple organ systems failure and requires high complexity decision making for assessment and support, frequent evaluation and titration of therapies, application of advanced monitoring technologies and extensive interpretation of multiple databases. Critical Care Time devoted to patient care services described in this note is 60 minutes.    Sandrea Hughs, MD Pulmonary and Critical Care Medicine [facility] Cell [phone]

## 2011-01-30 NOTE — Progress Notes (Signed)
ANTIBIOTIC CONSULT NOTE - FOLLOW UP  Pharmacy Consult for Vanc/Zosyn Indication: Neutropenia/HCAP  No Known Allergies  Patient Measurements: Height: 6' (182.9 cm) Weight: 184 lb 15.5 oz (83.9 kg) IBW/kg (Calculated) : 77.6    Vital Signs: Temp: 97.2 F (36.2 C) (12/23 2000) Temp src: Axillary (12/23 2000) BP: 116/63 mmHg (12/23 1800) Pulse Rate: 71  (12/23 1800) Intake/Output from previous day: 12/22 0701 - 12/23 0700 In: 3310 [P.O.:1060; I.V.:925; IV Piggyback:1325] Out: 2500 [Urine:2500] Intake/Output from this shift:    Labs:  Basename 01/30/11 1435 01/30/11 0936 01/30/11 0318 01/29/11 0418  WBC 0.4* -- 0.2* 0.1*  HGB 9.4* -- 9.3* 9.3*  PLT 27* -- 26* 23*23*  LABCREA -- -- -- --  CREATININE 0.76 0.83 0.80 --   Estimated Creatinine Clearance: 107.8 ml/min (by C-G formula based on Cr of 0.76).  Basename 01/30/11 2130  VANCOTROUGH 21.9*  VANCOPEAK --  Drue Dun --  GENTTROUGH --  GENTPEAK --  GENTRANDOM --  TOBRATROUGH --  TOBRAPEAK --  TOBRARND --  AMIKACINPEAK --  AMIKACINTROU --  AMIKACIN --     Microbiology: Recent Results (from the past 720 hour(s))  TECHNOLOGIST REVIEW     Status: Normal   Collection Time   01/26/11  1:59 PM      Component Value Range Status Comment   Technologist Review Occas Lymph, Rare Seg   Final   CULTURE, BLOOD (ROUTINE X 2)     Status: Normal (Preliminary result)   Collection Time   01/26/11  5:10 PM      Component Value Range Status Comment   Specimen Description BLOOD   Final    Special Requests BOTTLES DRAWN AEROBIC AND ANAEROBIC   Final    Setup Time [id number]   Final    Culture     Final    Value:        BLOOD CULTURE RECEIVED NO GROWTH TO DATE CULTURE WILL BE HELD FOR 5 DAYS BEFORE ISSUING A FINAL NEGATIVE REPORT   Report Status PENDING   Incomplete   CULTURE, BLOOD (ROUTINE X 2)     Status: Normal (Preliminary result)   Collection Time   01/26/11  5:19 PM      Component Value Range Status Comment   Specimen Description BLOOD RIGHT ARM   Final    Special Requests BOTTLES DRAWN AEROBIC AND ANAEROBIC 5CC   Final    Setup Time [id number]   Final    Culture     Final    Value:        BLOOD CULTURE RECEIVED NO GROWTH TO DATE CULTURE WILL BE HELD FOR 5 DAYS BEFORE ISSUING A FINAL NEGATIVE REPORT   Report Status PENDING   Incomplete   URINE CULTURE     Status: Normal   Collection Time   01/26/11  7:21 PM      Component Value Range Status Comment   Specimen Description URINE, CLEAN CATCH   Final    Special Requests NONE   Final    Setup Time [id number]   Final    Colony Count NO GROWTH   Final    Culture NO GROWTH   Final    Report Status 01/27/2011 FINAL   Final   CULTURE, BLOOD (ROUTINE X 2)     Status: Normal (Preliminary result)   Collection Time   01/26/11  9:40 PM      Component Value Range Status Comment   Specimen Description BLOOD RIGHT HAND   Final  Special Requests BOTTLES DRAWN AEROBIC AND ANAEROBIC 3CC   Final    Setup Time [id number]   Final    Culture     Final    Value:        BLOOD CULTURE RECEIVED NO GROWTH TO DATE CULTURE WILL BE HELD FOR 5 DAYS BEFORE ISSUING A FINAL NEGATIVE REPORT   Report Status PENDING   Incomplete   CULTURE, BLOOD (ROUTINE X 2)     Status: Normal (Preliminary result)   Collection Time   01/26/11  9:45 PM      Component Value Range Status Comment   Specimen Description BLOOD RIGHT HAND   Final    Special Requests BOTTLES DRAWN AEROBIC ONLY 3CC   Final    Setup Time [id number]   Final    Culture     Final    Value:        BLOOD CULTURE RECEIVED NO GROWTH TO DATE CULTURE WILL BE HELD FOR 5 DAYS BEFORE ISSUING A FINAL NEGATIVE REPORT   Report Status PENDING   Incomplete   MRSA PCR SCREENING     Status: Normal   Collection Time   01/27/11  9:39 PM      Component Value Range Status Comment   MRSA by PCR NEGATIVE  NEGATIVE  Final   CLOSTRIDIUM DIFFICILE BY PCR     Status: Normal   Collection Time   01/28/11  3:50 AM      Component  Value Range Status Comment   C difficile by pcr NEGATIVE  NEGATIVE  Final     Anti-infectives     Start     Dose/Rate Route Frequency Ordered Stop   01/30/11 1100   fluconazole (DIFLUCAN) tablet 100 mg        100 mg Oral Daily 01/30/11 1010     01/29/11 1400   vancomycin (VANCOCIN) IVPB 1000 mg/200 mL premix        1,000 mg 200 mL/hr over 60 Minutes Intravenous Every 8 hours 01/29/11 1200     01/29/11 1200   fluconazole (DIFLUCAN) IVPB 200 mg  Status:  Discontinued        200 mg 100 mL/hr over 60 Minutes Intravenous Every 24 hours 01/29/11 1009 01/30/11 1010   01/27/11 0815   ciprofloxacin (CIPRO) IVPB 400 mg  Status:  Discontinued        400 mg 200 mL/hr over 60 Minutes Intravenous Every 12 hours 01/27/11 0802 01/27/11 0956   01/27/11 0600   vancomycin (VANCOCIN) IVPB 1000 mg/200 mL premix  Status:  Discontinued        1,000 mg 200 mL/hr over 60 Minutes Intravenous Every 12 hours 01/26/11 2119 01/29/11 1159   01/27/11 0200   piperacillin-tazobactam (ZOSYN) IVPB 3.375 g        3.375 g 12.5 mL/hr over 240 Minutes Intravenous Every 8 hours 01/26/11 2119     01/26/11 2200   piperacillin-tazobactam (ZOSYN) IVPB 2.25 g  Status:  Discontinued        2.25 g 100 mL/hr over 30 Minutes Intravenous 3 times per day 01/26/11 2104 01/26/11 2118   01/26/11 2115   vancomycin (VANCOCIN) IVPB 1000 mg/200 mL premix  Status:  Discontinued        1,000 mg 200 mL/hr over 60 Minutes Intravenous Every 12 hours 01/26/11 2104 01/26/11 2108   01/26/11 1715   vancomycin (VANCOCIN) IVPB 1000 mg/200 mL premix        1,000 mg 200 mL/hr over 60 Minutes Intravenous To  Emergency Dept 01/26/11 1708 01/26/11 1833   01/26/11 1715   piperacillin-tazobactam (ZOSYN) IVPB 4.5 g        4.5 g 200 mL/hr over 30 Minutes Intravenous To Emergency Dept 01/26/11 1708 01/26/11 1919          Assessment: D5 Vancomycin/Zosyn for neutropenia/HCAP.  Scr is improving (Scr = 0.76 on 01/30/11) and Vancomycin trough level =  21.9 mcg/ml which is above goal of 15-20 mcg/ml.  Patient currently on Vancomycin 1000mg  IV q8h.    Goal of Therapy:  Vancomycin trough level 15-20 mcg/ml Appropriate renal dosing of Zosyn  Plan:  1.  Will change Vancomycin to 1250mg  IV q12h and recheck vancomycin trough level as appropriate. 2.  Continue Zosyn 3.375gm IV q8h.  Maryellen Pile  01/30/2011 10:43 PM

## 2011-01-30 NOTE — Progress Notes (Addendum)
Dennis [name] ZOX:[id number],[id number] is a 60 y.o. male,  Outpatient Primary MD for the patient is [name], Dennis Ammons, MD, MD  Chief Complaint  Patient presents with  . Nausea  . Shortness of Breath        Subjective:   [name] today has, No headache, No chest pain, No abdominal pain - No Nausea, No new weakness tingling or numbness, More SOB today.  Objective:   Filed Vitals:   01/30/11 0135 01/30/11 0200 01/30/11 0400 01/30/11 0600  BP: 133/75  130/76 137/76  Pulse: 100 105 97 111  Temp:   98.7 F (37.1 C)   TempSrc:   Oral   Resp: 27 26 33 28  Height:      Weight:   83.9 kg (184 lb 15.5 oz)   SpO2: 94% 92% 94% 92%    Wt Readings from Last 3 Encounters:  01/30/11 83.9 kg (184 lb 15.5 oz)  01/17/11 85.367 kg (188 lb 3.2 oz)  01/10/11 81.965 kg (180 lb 11.2 oz)     Intake/Output Summary (Last 24 hours) at 01/30/11 0809 Last data filed at 01/30/11 0655  Gross per 24 hour  Intake   2845 ml  Output   2500 ml  Net    345 ml    Exam Awake Alert, Oriented *3, No new F.N deficits, Normal affect [place].AT,[name] Supple Neck,No JVD, No cervical lymphadenopathy appriciated.  Symmetrical Chest wall movement, Good air movement bilaterally, ++ rales bilat today RRR,No Gallops,Rubs or new Murmurs, No Parasternal Heave +ve B.Sounds, Abd Soft, Non tender, No organomegaly appriciated, No rebound -guarding or rigidity. No Cyanosis, Clubbing or edema, No new Rash or bruise    Data Review  CBC  Lab 01/30/11 0318 01/29/11 0418 01/28/11 0310 01/27/11 0506 01/26/11 1616 01/26/11 1359  WBC 0.2* 0.1* 0.1* 0.1* 0.1* --  HGB 9.3* 9.3* 9.9* 10.7* 11.3* --  HCT 25.3* 25.4* 27.1* 29.4* 30.6* --  PLT 26* 23*23* 23* 37* 62* --  MCV 83.5 83.3 83.1 83.8 84.3 --  MCH 30.7 30.5 30.4 30.5 31.1 --  MCHC 36.8* 36.6* 36.5* 36.4* 36.9* --  RDW 12.7 12.7 12.5 12.5 12.2 --  LYMPHSABS 0.0* 0.0* 0.0* -- 0.0* 0.1*  MONOABS 0.0* 0.0* 0.0* -- 0.0* 0.0*  EOSABS 0.0 0.0 0.0 -- 0.0 0.0    BASOSABS 0.0 0.0 0.0 -- 0.0 0.0  BANDABS -- -- -- -- -- --    Chemistries   Lab 01/30/11 0318 01/29/11 0418 01/28/11 1245 01/28/11 0610 01/27/11 0506 01/26/11 1616  NA 125* 124* -- 123* 120* 114*  K 3.0* 3.1* 2.9* 2.6* 3.1* --  CL 97 96 -- 90* 89* 83*  CO2 20 20 -- 21 18* 21  GLUCOSE 71 74 -- 89 71 78  BUN 11 13 -- 14 16 23   CREATININE 0.80 0.82 -- 0.96 1.19 1.41*  CALCIUM 7.6* 7.6* -- 7.1* 7.5* 7.6*  MG 1.9 -- 2.3 -- -- --  AST -- -- -- -- -- 14  ALT -- -- -- -- -- 12  ALKPHOS -- -- -- -- -- 68  BILITOT -- -- -- -- -- 1.3*   ------------------------------------------------------------------------------------------------------------------ estimated creatinine clearance is 107.8 ml/min (by C-G formula based on Cr of 0.8). ------------------------------------------------------------------------------------------------------------------ No results found for this basename: HGBA1C:2 in the last 72 hours ------------------------------------------------------------------------------------------------------------------ No results found for this basename: CHOL:2,HDL:2,LDLCALC:2,TRIG:2,CHOLHDL:2,LDLDIRECT:2 in the last 72 hours ------------------------------------------------------------------------------------------------------------------ No results found for this basename: TSH,T4TOTAL,FREET3,T3FREE,THYROIDAB in the last 72 hours ------------------------------------------------------------------------------------------------------------------ No results found for this basename: VITAMINB12:2,FOLATE:2,FERRITIN:2,TIBC:2,IRON:2,RETICCTPCT:2 in the  last 72 hours  Coagulation profile  Lab 01/29/11 0418 01/27/11 0506  INR 1.20 1.68*  PROTIME -- --     Basename 01/29/11 0418  DDIMER 2.83*    Cardiac Enzymes  Lab 01/26/11 1616  CKMB 2.0  TROPONINI <0.30  MYOGLOBIN --    ------------------------------------------------------------------------------------------------------------------ No components found with this basename: POCBNP:3  Micro Results Recent Results (from the past 240 hour(s))  TECHNOLOGIST REVIEW     Status: Normal   Collection Time   01/26/11  1:59 PM      Component Value Range Status Comment   Technologist Review Occas Lymph, Rare Seg   Final   CULTURE, BLOOD (ROUTINE X 2)     Status: Normal (Preliminary result)   Collection Time   01/26/11  5:10 PM      Component Value Range Status Comment   Specimen Description BLOOD   Final    Special Requests BOTTLES DRAWN AEROBIC AND ANAEROBIC   Final    Setup Time [id number]   Final    Culture     Final    Value:        BLOOD CULTURE RECEIVED NO GROWTH TO DATE CULTURE WILL BE HELD FOR 5 DAYS BEFORE ISSUING A FINAL NEGATIVE REPORT   Report Status PENDING   Incomplete   CULTURE, BLOOD (ROUTINE X 2)     Status: Normal (Preliminary result)   Collection Time   01/26/11  5:19 PM      Component Value Range Status Comment   Specimen Description BLOOD RIGHT ARM   Final    Special Requests BOTTLES DRAWN AEROBIC AND ANAEROBIC 5CC   Final    Setup Time [id number]   Final    Culture     Final    Value:        BLOOD CULTURE RECEIVED NO GROWTH TO DATE CULTURE WILL BE HELD FOR 5 DAYS BEFORE ISSUING A FINAL NEGATIVE REPORT   Report Status PENDING   Incomplete   URINE CULTURE     Status: Normal   Collection Time   01/26/11  7:21 PM      Component Value Range Status Comment   Specimen Description URINE, CLEAN CATCH   Final    Special Requests NONE   Final    Setup Time [id number]   Final    Colony Count NO GROWTH   Final    Culture NO GROWTH   Final    Report Status 01/27/2011 FINAL   Final   CULTURE, BLOOD (ROUTINE X 2)     Status: Normal (Preliminary result)   Collection Time   01/26/11  9:40 PM      Component Value Range Status Comment   Specimen Description BLOOD RIGHT HAND   Final     Special Requests BOTTLES DRAWN AEROBIC AND ANAEROBIC 3CC   Final    Setup Time [id number]   Final    Culture     Final    Value:        BLOOD CULTURE RECEIVED NO GROWTH TO DATE CULTURE WILL BE HELD FOR 5 DAYS BEFORE ISSUING A FINAL NEGATIVE REPORT   Report Status PENDING   Incomplete   CULTURE, BLOOD (ROUTINE X 2)     Status: Normal (Preliminary result)   Collection Time   01/26/11  9:45 PM      Component Value Range Status Comment   Specimen Description BLOOD RIGHT HAND   Final    Special Requests BOTTLES DRAWN AEROBIC ONLY 3CC   Final  Setup Time [id number]   Final    Culture     Final    Value:        BLOOD CULTURE RECEIVED NO GROWTH TO DATE CULTURE WILL BE HELD FOR 5 DAYS BEFORE ISSUING A FINAL NEGATIVE REPORT   Report Status PENDING   Incomplete   MRSA PCR SCREENING     Status: Normal   Collection Time   01/27/11  9:39 PM      Component Value Range Status Comment   MRSA by PCR NEGATIVE  NEGATIVE  Final   CLOSTRIDIUM DIFFICILE BY PCR     Status: Normal   Collection Time   01/28/11  3:50 AM      Component Value Range Status Comment   C difficile by pcr NEGATIVE  NEGATIVE  Final     Radiology Reports Dg Chest 2 View  01/26/2011  *RADIOLOGY REPORT*  Clinical Data: Shortness of breath  CHEST - 2 VIEW  Comparison: 12/10/2010  Findings: Cardiomediastinal silhouette is stable.  Mild thoracic spine osteopenia.  Hazy airspace disease right base suspicious for infiltrate.  Trace left basilar atelectasis or infiltrate.  No pulmonary edema.  IMPRESSION: Hazy airspace disease right base suspicious for infiltrate.  Trace left basilar atelectasis or infiltrate.  No pulmonary edema.  Original Report Authenticated By: Natasha Mead, M.D.   Ct Angio Chest W/cm &/or Wo Cm  01/26/2011  *RADIOLOGY REPORT*  Clinical Data:  Shortness of breath, recent DVT, rule out pulmonary embolus  CT ANGIOGRAPHY CHEST WITH CONTRAST  Technique:  Multidetector CT imaging of the chest was performed using the  standard protocol during bolus administration of intravenous contrast.  Multiplanar CT image reconstructions including MIPs were obtained to evaluate the vascular anatomy.  Contrast: 90mL OMNIPAQUE IOHEXOL 300 MG/ML IV SOLN  Comparison:  12/10/2010  Findings:  Images of the thoracic inlet are unremarkable.  Central airways are patent.  Mild atherosclerotic calcifications of thoracic aorta.  No hilar or mediastinal adenopathy.  Sagittal images of the spine shows mild degenerative changes thoracic spine.  No destructive bony lesions are noted.  Sagittal view of the sternum is unremarkable.  No pulmonary embolus is noted.  Images of the lung parenchyma shows no pulmonary edema.  In axial image 71 there is  small amount of consolidation in the right middle lobe suspicious for infiltrate.  Small amount of triangular- shaped consolidation noted in the left base anteriorly with air bronchogram suspicious for atelectasis or infiltrate.  Mild bronchial thickening noted in the right middle lobe.  The previous retrocrural adenopathy has decreased in size. Measures 3.1 x 2 cm on the prior exam measures 4.5 x 3.1 cm.  Heart size is within normal limits.  Atherosclerotic calcifications of the coronary arteries are noted.  Review of the MIP images confirms the above findings.\   IMPRESSION:  1.  No pulmonary embolus. 2.  There is a small amount of consolidation with air bronchogram in the right middle lobe suspicious for infiltrate.  Small amount of atelectasis or infiltrate noted in the left base anteriorly. 3.  No hilar or mediastinal adenopathy. 4.  Decrease in size of retrocrural adenopathy measures 3.1 x 2 cm. On prior exam measures 4.5 x 3.1 cm. 5.  No pulmonary edema.  Original Report Authenticated By: Natasha Mead, M.D.    Scheduled Meds:    . Boost Plus  237 mL Oral TID WC  . enoxaparin (LOVENOX) injection  120 mg Subcutaneous Q24H  . fluconazole (DIFLUCAN) IV  200 mg Intravenous Q24H  .  furosemide  20 mg  Intravenous Once  . hydrocortisone sodium succinate  100 mg Intravenous Q8H  . magnesium sulfate 1 - 4 g bolus IVPB  1 g Intravenous Once  . piperacillin-tazobactam (ZOSYN)  IV  3.375 g Intravenous Q8H  . potassium chloride  10 mEq Intravenous Q1 Hr x 4  . potassium chloride  10 mEq Intravenous Once  . potassium chloride  10 mEq Intravenous Q1 Hr x 4  . potassium chloride  40 mEq Oral BID  . potassium chloride  40 mEq Oral BID  . potassium chloride  40 mEq Oral TID  . vancomycin  1,000 mg Intravenous Q8H  . DISCONTD: enoxaparin  100 mg Subcutaneous Q24H  . DISCONTD: enoxaparin  100 mg Subcutaneous Q24H  . DISCONTD: enoxaparin  120 mg Subcutaneous Q24H  . DISCONTD: magnesium sulfate  1 g Intravenous Once  . DISCONTD: potassium chloride  40 mEq Oral BID  . DISCONTD: vancomycin  1,000 mg Intravenous Q12H   Continuous Infusions:    . sodium chloride 500 mL/hr (01/29/11 0850)  . 0.9 % sodium chloride with kcl 125 mL/hr at 01/29/11 0600  . DISCONTD: 0.9 % sodium chloride with kcl 75 mL/hr at 01/30/11 0019  . DISCONTD: 0.9 % sodium chloride with kcl 75 mL/hr at 01/30/11 0640   PRN Meds:.acetaminophen, albuterol, loperamide, morphine, oxyCODONE, prochlorperazine, prochlorperazine  Assessment & Plan   1.Nosocomial PNA - Neutropenic due to Chemo for Seminoma , SIRs -  Continue ABX  for now, afebrile,  ABX D/W Dr Orvan Falconer 01-27-11, cultures remain -ve, HaemOnc following. Neut precautions per protocol.PCCM following via Elink. More SOB with rales, CXR Bilat patchy infiltrates likely fluid OD, will hold IVF, gentle lasix, requested Dr Sherene Sires PCCM to see him, he suggested adding Hydrocortisone since BP was an issue and now IVF off, will add, he will see the pt in consult now. Will also check EKG and cycle enzymes.  Addendum - pt now complaining of more L sided Pl.Chest pain - could be from PNA, but has a DVT, will check CT Angio.   2. ARF (acute renal failure) with hyponatremia  - prerenal in  etiology  - resolved with IVF.   3. PNA (pneumonia) -From #1 above, as #1, in SDU, better.   4. Right leg DVT in the setting of Thrombocytopenia - continue Lovenox per Haem Onc , monitor plts.   5.Hyponatremia - Appears pre renal - Ur Na 23, improved with IVF, since IVF held, will monitor closely, Gatorade PO.   6.Hypokalemia - replace IV and PO TID today, check K and Mag in am. Likely from GI loss-diarrhea , encouraged RN to give ordered Imodium.   7.Chr Diarrhea - -ve C diff. PRN imodium.    DVT Prophylaxis  Lovenox    See all Orders from today for further details  Prognosis guarded , updated dad bedside - 01-30-11  Leroy Sea M.D  01/30/2011, 8:09 AM  [facility] Office  [phone]

## 2011-01-30 NOTE — Progress Notes (Signed)
ANTICOAGULATION CONSULT NOTE - Follow Up Consult  Pharmacy Consult for Lovenox Indication: hx RLE DVT  No Known Allergies  Patient Measurements: Height: 6' (182.9 cm) Weight: 184 lb 15.5 oz (83.9 kg) IBW/kg (Calculated) : 77.6   Vital Signs: Temp: 98.7 F (37.1 C) (12/23 0400) Temp src: Oral (12/23 0400) BP: 137/76 mmHg (12/23 0600) Pulse Rate: 111  (12/23 0600)  Labs:  Basename 01/30/11 0318 01/29/11 0418 01/28/11 0610 01/28/11 0310  HGB 9.3* 9.3* -- --  HCT 25.3* 25.4* -- 27.1*  PLT 26* 23*23* -- 23*  APTT -- 63* -- --  LABPROT -- 15.5* -- --  INR -- 1.20 -- --  HEPARINUNFRC -- -- -- --  CREATININE 0.80 0.82 0.96 --  CKTOTAL -- -- -- --  CKMB -- -- -- --  TROPONINI -- -- -- --   Estimated Creatinine Clearance: 107.8 ml/min (by C-G formula based on Cr of 0.8).   Medications:  Scheduled:    . Boost Plus  237 mL Oral TID WC  . enoxaparin  100 mg Subcutaneous Q24H  . fluconazole (DIFLUCAN) IV  200 mg Intravenous Q24H  . magnesium sulfate 1 - 4 g bolus IVPB  1 g Intravenous Once  . piperacillin-tazobactam (ZOSYN)  IV  3.375 g Intravenous Q8H  . potassium chloride  10 mEq Intravenous Q1 Hr x 4  . potassium chloride  10 mEq Intravenous Once  . potassium chloride  10 mEq Intravenous Q1 Hr x 4  . potassium chloride  40 mEq Oral BID  . potassium chloride  40 mEq Oral BID  . potassium chloride  40 mEq Oral BID  . vancomycin  1,000 mg Intravenous Q8H  . DISCONTD: enoxaparin  100 mg Subcutaneous Q24H  . DISCONTD: enoxaparin  120 mg Subcutaneous Q24H  . DISCONTD: magnesium sulfate  1 g Intravenous Once  . 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 Was on120mg  (1.5mg /kg) SQ q24h per home dose. MD empirically reduced the dose to 100mg  q24h yesterday and would like to check a level to make sure dose is appropriate. 120mg  dose received at 2247 12/21. LMWH level must be drawn 4 hr after dose is given to evaluate.  LMWH level this am is 0.51, subtherapeutic. H/H are stable. Pltc low. No bleeding reported.  Goal of Therapy:  LMWH level 1-2 units/ml 4 hour after dose administration (for dose of 1.5mg /kg q24h)   Plan:  Increase dose to 120mg  sq q24h (1.5mg /kg q24h). Monitor for s/sx bleeding closely. Recheck LMWH level @Css  (4hr after 4th dose).  Gwen Her PharmD  [phone] 01/30/2011 7:17 AM

## 2011-01-31 ENCOUNTER — Other Ambulatory Visit: Payer: Self-pay | Admitting: Oncology

## 2011-01-31 ENCOUNTER — Other Ambulatory Visit: Payer: 59 | Admitting: Lab

## 2011-01-31 LAB — DIFFERENTIAL
Band Neutrophils: 0 % (ref 0–10)
Basophils Relative: 0 % (ref 0–1)
Lymphs Abs: 0 10*3/uL — ABNORMAL LOW (ref 0.7–4.0)
Monocytes Absolute: 0 10*3/uL — ABNORMAL LOW (ref 0.1–1.0)
Neutrophils Relative %: 0 % — ABNORMAL LOW (ref 43–77)

## 2011-01-31 LAB — CBC
HCT: 27 % — ABNORMAL LOW (ref 39.0–52.0)
Hemoglobin: 9.8 g/dL — ABNORMAL LOW (ref 13.0–17.0)
MCH: 30.3 pg (ref 26.0–34.0)
MCHC: 36.3 g/dL — ABNORMAL HIGH (ref 30.0–36.0)
MCV: 83.6 fL (ref 78.0–100.0)

## 2011-01-31 LAB — CARDIAC PANEL(CRET KIN+CKTOT+MB+TROPI)
CK, MB: 1.8 ng/mL (ref 0.3–4.0)
Relative Index: INVALID (ref 0.0–2.5)
Total CK: 11 U/L (ref 7–232)
Troponin I: <0.3 ng/mL (ref <0.30)

## 2011-01-31 LAB — URINALYSIS, ROUTINE W REFLEX MICROSCOPIC
Bilirubin Urine: NEGATIVE
Glucose, UA: 250 mg/dL — AB
Ketones, ur: NEGATIVE mg/dL
pH: 6.5 (ref 5.0–8.0)

## 2011-01-31 LAB — URINE MICROSCOPIC-ADD ON

## 2011-01-31 LAB — BASIC METABOLIC PANEL
BUN: 12 mg/dL (ref 6–23)
Chloride: 98 mEq/L (ref 96–112)
Glucose, Bld: 129 mg/dL — ABNORMAL HIGH (ref 70–99)
Potassium: 3.9 mEq/L (ref 3.5–5.1)

## 2011-01-31 LAB — OSMOLALITY, URINE: Osmolality, Ur: 406 mOsm/kg (ref 390–1090)

## 2011-01-31 NOTE — Progress Notes (Signed)
HOSPITAL PROGRESS NOTE   CHA GOMILLION 60 y.o.  [birth year]    HISTORY: Elijah Birk seems much improved compared with his condition on Friday, 01/28/2011. Still having some diarrhea and dyspnea. He remains quite debilitated. He is having some left-sided chest pain especially with coughing. He apparently has not been out of bed since his admission to the hospital.  MEDICATIONS:  Scheduled Meds:   . Boost Plus  237 mL Oral TID WC  . enoxaparin (LOVENOX) injection  120 mg Subcutaneous Q24H  . fentaNYL  12.5 mcg Transdermal Q72H  . fluconazole  100 mg Oral Daily  . hydrocortisone sodium succinate  100 mg Intravenous Q8H  . piperacillin-tazobactam (ZOSYN)  IV  3.375 g Intravenous Q8H  . potassium chloride  10 mEq Intravenous Q1 Hr x 2  . potassium chloride  40 mEq Oral BID  . vancomycin  1,250 mg Intravenous Q12H  . DISCONTD: vancomycin  1,000 mg Intravenous Q8H   Continuous Infusions:  PRN Meds:.acetaminophen, albuterol, iohexol, loperamide, morphine, oxyCODONE, prochlorperazine, prochlorperazine  I have reviewed the patient's current medications.  PHYSICAL EXAM:  Blood pressure 102/64, pulse 77, temperature 97.2 F (36.2 C), temperature source Axillary, resp. rate 23, height 6' (1.829 m), weight 182 lb 15.7 oz (83 kg), SpO2 92.00%. currently on room air. General: In no acute distress but dyspneic with talking. He looks chronically ill.   HEENT: No scleral icterus. Mouth and pharynx were benign. No thrush or ulcers. Lymphatics: No adenopathy. Resp: Expiratory rhonchi cleared with coughing. Breath sounds are harsh. Cardiac: Regular rhythm without murmur or rub. Abdomen: Benign with no organomegaly or masses. No tenderness. Extrem: Right leg remains swollen with pitting edema is markedly improved from 1-2 weeks ago. Left leg is benign. Right testicle remains large and firm but somewhat smaller from one to 2 weeks ago. Neuro: Intact with no focal findings. Patient is alert and  interactive.      LABS:  CBC:   Lab 01/31/11 0315 01/30/11 1435 01/30/11 0318 01/29/11 0418 01/28/11 0310 01/26/11 1616  WBC 0.8* 0.4* 0.2* 0.1* 0.1* --  HGB 9.8* 9.4* 9.3* 9.3* 9.9* --  HCT 27.0* 25.6* 25.3* 25.4* 27.1* --  PLT 33* 27* 26* 23*23* 23* --  MCV 83.6 83.4 83.5 83.3 83.1 --  MCH 30.3 30.6 30.7 30.5 30.4 --  MCHC 36.3* 36.7* 36.8* 36.6* 36.5* --  RDW 12.9 12.8 12.7 12.7 12.5 --  LYMPHSABS 0.0* -- 0.0* 0.0* 0.0* 0.0*  MONOABS 0.0* -- 0.0* 0.0* 0.0* 0.0*  EOSABS 0.0 -- 0.0 0.0 0.0 0.0  BASOSABS 0.0 -- 0.0 0.0 0.0 0.0  BANDABS -- -- -- -- -- --     CHEM:   Lab 01/31/11 0315 01/30/11 1435 01/30/11 0936 01/30/11 0318 01/29/11 0418 01/28/11 1245 01/26/11 1616  NA 128* 126* 127* 125* 124* -- --  K 3.9 3.3* 3.0* 3.0* 3.1* -- --  CL 98 95* 96 97 96 -- --  CO2 22 22 21 20 20  -- --  GLUCOSE 129* 152* 89 71 74 -- --  BUN 12 11 11 11 13  -- --  CREATININE 0.74 0.76 0.83 0.80 0.82 -- --  CALCIUM 8.2* 8.0* 8.1* 7.6* 7.6* -- --  MG 2.2 -- -- 1.9 -- 2.3 --  AST -- -- -- -- -- -- 14  ALT -- -- -- -- -- -- 12  ALKPHOS -- -- -- -- -- -- 68  BILITOT -- -- -- -- -- -- 1.3*  LDH -- -- -- -- -- -- --  Lab 01/29/11 0418 01/27/11 0506  INR 1.20 1.68*  PROTIME -- --     RADIOLOGY:  Ct Angio Chest W/cm &/or Wo Cm  01/30/2011  *RADIOLOGY REPORT*  Clinical Data:  Progressive shortness of breath, diaphoresis, history of DVT, metastatic seminoma, thrombocytopenia, pneumonia  CT ANGIOGRAPHY CHEST WITH CONTRAST  Technique:  Multidetector CT imaging of the chest was performed using the standard protocol during bolus administration of intravenous contrast.  Multiplanar CT image reconstructions including MIPs were obtained to evaluate the vascular anatomy.  Contrast:  80 ml Omnipaque-300  Comparison:  01/30/2011, 01/26/2011  Findings:  No significant central or proximal hilar pulmonary embolus or filling defect by CTA.  Limited with some respiratory motion artifact.  Minimal  atherosclerotic calcifications.  Thoracic aorta appears intact.  No dissection or aneurysm.  Normal heart size.  Coronary calcifications noted.  No pericardial effusion. Very small left pleural effusion identified.  Included upper abdomen demonstrates no acute process.  Lung windows demonstrate worsening patchy scattered airspace disease throughout both lungs diffusely, more pronounced in the lingula, both lower lobes, and the inferior right middle lobe.  Degenerative changes of the spine.  No acute osseous abnormality.  Review of the MIP images confirms the above findings.  IMPRESSION: Negative for significant central or proximal hilar pulmonary embolus by CTA.  Worsening bilateral patchy scattered airspace process, most pronounced in the left upper lobe, lingula, and both lower lobes, suspect multilobar pneumonia.  New small left effusion.  Original Report Authenticated By: Judie Petit. Ruel Favors, M.D.   Dg Chest Port 1 View  01/30/2011  *RADIOLOGY REPORT*  Clinical Data: Shortness of breath.  PORTABLE CHEST - 1 VIEW  Comparison: 01/29/2011  Findings: Worsening patchy bilateral airspace disease, including the right upper lobe.  Heart is normal size.  No effusions.  No acute bony abnormality.  IMPRESSION: Worsening patchy bilateral airspace disease.  Given the appearance, distribution and normal heart size, I favor multifocal pneumonia. Atypical (noncardiogenic) edema is possible but felt less likely.  Original Report Authenticated By: Cyndie Chime, M.D.     ASSESSMENT & PLAN:  1. 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  S/P cycle 1 of Cisplatin and Etoposide given for 5 days starting on 01/17/11 with neulasta on day 6, 01/22/11  2. Severe pancytopenia, now day 15 post chemo.  Counts starting to recover.   3.  Multilobar pneumonia, currently on Vancomycin and Zosyn.  All cultures negative to date.  CT chest angio--no pulmonary emboli.  4. Diarrhea--C. diff negative.   Using Imodium.  Improved.  5.  Extensive RLE DVT diagnosed 2 weeks ago.  On lovenox 120 mg/day--to continue.  Holding coumadin for now.  6.  Hypotension--improved with IVF and IV steroids.  7.  Hyponatremia--improving, ?adrenal insufficiency.  8.  Renal insufficiency---improved since admission.  9.  Hypokalemia--corrected  10.  Full code.  11.  Deconditioning.  Recommend aggressive mobilization, OOB and PT/OT if indicated.   OK to move to regular floor if stable in the morning.  OK to d/c antibiotics when ANC >/=1.0  if pt remains afebrile and cultures remain negative. Thank you for excellent care.  Sheritha Louis S 01/31/2011, 9:19 PM

## 2011-01-31 NOTE — Progress Notes (Signed)
ANTICOAGULATION CONSULT NOTE - Follow Up Consult  Pharmacy Consult for Lovenox Indication: hx RLE DVT  No Known Allergies  Patient Measurements: Height: 6' (182.9 cm) Weight: 182 lb 15.7 oz (83 kg) IBW/kg (Calculated) : 77.6   Vital Signs: Temp: 96.6 F (35.9 C) (12/24 0800) Temp src: Axillary (12/24 0800) BP: 107/66 mmHg (12/24 0400) Pulse Rate: 56  (12/24 0400)  Labs:  Basename 01/31/11 0315 01/31/11 0035 01/30/11 1435 01/30/11 0936 01/30/11 0930 01/30/11 0318 01/29/11 0418  HGB 9.8* -- 9.4* -- -- -- --  HCT 27.0* -- 25.6* -- -- 25.3* --  PLT 33* -- 27* -- -- 26* --  APTT -- -- -- -- -- -- 63*  LABPROT -- -- -- -- -- -- 15.5*  INR -- -- -- -- -- -- 1.20  HEPARINUNFRC -- -- -- -- -- -- --  CREATININE 0.74 -- 0.76 0.83 -- -- --  CKTOTAL -- 11 11 -- 11 -- --  CKMB -- 1.8 1.7 -- 1.7 -- --  TROPONINI -- <0.30 <0.30 -- <0.30 -- --   Estimated Creatinine Clearance: 107.8 ml/min (by C-G formula based on Cr of 0.74).  LMWH level on 12/23 = 0.51  Medications:  Scheduled:     . Boost Plus  237 mL Oral TID WC  . enoxaparin (LOVENOX) injection  120 mg Subcutaneous Q24H  . fentaNYL  12.5 mcg Transdermal Q72H  . fluconazole  100 mg Oral Daily  . hydrocortisone sodium succinate  100 mg Intravenous Q8H  . piperacillin-tazobactam (ZOSYN)  IV  3.375 g Intravenous Q8H  . potassium chloride  10 mEq Intravenous Q1 Hr x 4  . potassium chloride  10 mEq Intravenous Q1 Hr x 2  . potassium chloride  40 mEq Oral BID  . potassium chloride  40 mEq Oral TID  . vancomycin  1,250 mg Intravenous Q12H  . DISCONTD: fluconazole (DIFLUCAN) IV  200 mg Intravenous Q24H  . DISCONTD: vancomycin  1,000 mg Intravenous Q8H    Assessment: 60 yo M on chronic LMWH for hx of DVT. Was on120mg  (1.5mg /kg) SQ q24h per home dose. MD empirically reduced the dose to 100mg  q24h on 12/22 but LMWH level after that dose was only 0.51 so dose was increased back to 120mg  SQ q24 and patient has received 1 dose of 120mg   q24 after 100mg  thus far. LMWH level must be drawn 4 hr after dose is given to evaluate.   Goal of Therapy:  LMWH level 1-2 units/ml 4 hour after dose administration (for dose of 1.5mg /kg q24h)   Plan:  Patient has been on 120mg  (1.5mg /kg) SQ q24 minus 1 dose of 100mg  on 12/22 PM. With level being only 0.51 after the 100mg , I am inclined to check another level after tonight's dose to get an idea of where the 120mg  dose stands in tems of levels. Dose should be essentially at steady state since patient has been on 120mg  since Lovenox started minus 1 day of 100mg   Hessie Knows, PharmD, BCPS 01/31/2011 8:42 AM [phone]

## 2011-01-31 NOTE — Progress Notes (Signed)
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 complicated by DVT one week pta on lovenox  admit 12/19 with sob and fatigue N and V found to have neutopenic fever and possible sepsis rx with abx/fluids but more sob with increasing infiltrates am 12/23 so ccm asked to evaluate  Lines / Drains:    Cultures: Urine 12/19 > neg BC x 4  12/19 > C diff 12/21  > neg  Antibiotics: Zosyn (neutropenic sepsis) 12/19 >>> Vanc (neutropenic sepsis) 12/19 >>> Fluconazole (neutropenic sepsis) 12/22>>    Tests / Events:    Overnight Much more comfortable breathing on np @ 2lpm   Vital Signs: Temp:  [96.6 F (35.9 C)-99 F (37.2 C)] 97.5 F (36.4 C) (12/24 1200) Pulse Rate:  [56-85] 64  (12/24 1055) Resp:  [18-33] 20  (12/24 1055) BP: (99-127)/(57-76) 109/70 mmHg (12/24 1055) SpO2:  [90 %-99 %] 96 % (12/24 1055) FiO2 (%):  [2 %] 2 % (12/24 1055) Weight:  [182 lb 15.7 oz (83 kg)] 182 lb 15.7 oz (83 kg) (12/24 0000) I/O last 3 completed shifts: In: 2770 [P.O.:580; I.V.:890; IV Piggyback:1300] Out: 4300 [Urine:4300]       Intake/Output Summary (Last 24 hours) at 01/31/11 1325 Last data filed at 01/31/11 1300  Gross per 24 hour  Intake   1240 ml  Output   1725 ml  Net   -485 ml    Physical Examination:    sats mid 90s on 2pm np  HEENT mild turbinate edema.  Oropharynx no thrush or excess pnd or cobblestoning.  No JVD or cervical adenopathy. Mild accessory muscle hypertrophy. Trachea midline, nl thryroid. Chest was minimally hyperinflated by percussion with diminished breath sounds and very minimal increased exp time without wheeze. Hoover sign positive at very end of  inspiration. Regular rate and rhythm without murmur gallop or rub or increase P2  Abd: no hsm, nl excursion. Ext warm without cyanosis or clubbing.  RLE tensely swollen    Labs :  Lab 01/31/11 0315 01/30/11 1435  01/30/11 0936  NA 128* 126* 127*  K 3.9 3.3* 3.0*  CL 98 95* 96  CO2 22 22 21   BUN 12 11 11   CREATININE 0.74 0.76 0.83  GLUCOSE 129* 152* 89    Lab 01/31/11 0315 01/30/11 1435 01/30/11 0318  HGB 9.8* 9.4* 9.3*  HCT 27.0* 25.6* 25.3*  WBC 0.8* 0.4* 0.2*  PLT 33* 27* 26*        Assessment and Plan:  Acute resp failure/ 02 dep c/w pulmonary edema with ALI and ? Cardiogenic/vol overload component  No indication for escalation of care at present > ok for floor 12/24   Neutropenic sepsis - Agree with broad abx - Rec check cortisol level and started [facility] 12/23  Severe Thrombocyctopenia Lab Results  Component Value Date   PLT 33* 01/31/2011   PLT 27* 01/30/2011   PLT 26* 01/30/2011   PLT 82* 01/26/2011   PLT 215 01/24/2011   PLT 300 01/21/2011   rx per heme/onc  R DVT rx per Heme onc, no obvious PE ? If R leg is obstructed by tumor?   Best practices / Disposition: --> ok for floor -->full code --> LMWM  For DVT Rx per  Heme onc -->Protonix for GI Px     Sandrea Hughs, MD Pulmonary and Critical Care Medicine [facility]  Cell [phone]

## 2011-01-31 NOTE — Progress Notes (Signed)
Dennis [name] ZOX:[id number] is a 60 y.o. male,  Outpatient Primary MD for the patient is [name], [name], MD, MD  Chief Complaint  Patient presents with  . Nausea  . Shortness of Breath        Subjective:   Dennis [name] today has, No headache, No chest pain, No abdominal pain - No Nausea, No new weakness tingling or numbness, No SOB today.  Objective:   Filed Vitals:   01/30/11 2100 01/31/11 0000 01/31/11 0400 01/31/11 0800  BP: 101/69 99/57 107/66   Pulse:  65 56   Temp:  96.7 F (35.9 C) 97.4 F (36.3 C) 96.6 F (35.9 C)  TempSrc:  Axillary Oral Axillary  Resp:  21 20   Height:      Weight:  83 kg (182 lb 15.7 oz)    SpO2:  95% 96%     Wt Readings from Last 3 Encounters:  01/31/11 83 kg (182 lb 15.7 oz)  01/17/11 85.367 kg (188 lb 3.2 oz)  01/10/11 81.965 kg (180 lb 11.2 oz)     Intake/Output Summary (Last 24 hours) at 01/31/11 0859 Last data filed at 01/31/11 0500  Gross per 24 hour  Intake   1090 ml  Output   2900 ml  Net  -1810 ml    Exam Awake Alert, Oriented *3, No new F.N deficits, Normal affect [place].AT,[name] Supple Neck,No JVD, No cervical lymphadenopathy appriciated.  Symmetrical Chest wall movement, Good air movement bilaterally, few rales RRR,No Gallops,Rubs or new Murmurs, No Parasternal Heave +ve B.Sounds, Abd Soft, Non tender, No organomegaly appriciated, No rebound -guarding or rigidity. No Cyanosis, Clubbing or edema, No new Rash or bruise    Data Review  CBC  Lab 01/31/11 0315 01/30/11 1435 01/30/11 0318 01/29/11 0418 01/28/11 0310 01/26/11 1616  WBC 0.8* 0.4* 0.2* 0.1* 0.1* --  HGB 9.8* 9.4* 9.3* 9.3* 9.9* --  HCT 27.0* 25.6* 25.3* 25.4* 27.1* --  PLT 33* 27* 26* 23*23* 23* --  MCV 83.6 83.4 83.5 83.3 83.1 --  MCH 30.3 30.6 30.7 30.5 30.4 --  MCHC 36.3* 36.7* 36.8* 36.6* 36.5* --  RDW 12.9 12.8 12.7 12.7 12.5 --  LYMPHSABS 0.0* -- 0.0* 0.0* 0.0* 0.0*  MONOABS 0.0* -- 0.0* 0.0* 0.0* 0.0*  EOSABS 0.0 -- 0.0  0.0 0.0 0.0  BASOSABS 0.0 -- 0.0 0.0 0.0 0.0  BANDABS -- -- -- -- -- --    Chemistries   Lab 01/31/11 0315 01/30/11 1435 01/30/11 0936 01/30/11 0318 01/29/11 0418 01/28/11 1245 01/26/11 1616  NA 128* 126* 127* 125* 124* -- --  K 3.9 3.3* 3.0* 3.0* 3.1* -- --  CL 98 95* 96 97 96 -- --  CO2 22 22 21 20 20  -- --  GLUCOSE 129* 152* 89 71 74 -- --  BUN 12 11 11 11 13  -- --  CREATININE 0.74 0.76 0.83 0.80 0.82 -- --  CALCIUM 8.2* 8.0* 8.1* 7.6* 7.6* -- --  MG 2.2 -- -- 1.9 -- 2.3 --  AST -- -- -- -- -- -- 14  ALT -- -- -- -- -- -- 12  ALKPHOS -- -- -- -- -- -- 68  BILITOT -- -- -- -- -- -- 1.3*   ------------------------------------------------------------------------------------------------------------------ estimated creatinine clearance is 107.8 ml/min (by C-G formula based on Cr of 0.74). ------------------------------------------------------------------------------------------------------------------ No results found for this basename: HGBA1C:2 in the last 72 hours ------------------------------------------------------------------------------------------------------------------ No results found for this basename: CHOL:2,HDL:2,LDLCALC:2,TRIG:2,CHOLHDL:2,LDLDIRECT:2 in the last 72 hours ------------------------------------------------------------------------------------------------------------------ No results found for this basename:  TSH,T4TOTAL,FREET3,T3FREE,THYROIDAB in the last 72 hours ------------------------------------------------------------------------------------------------------------------ No results found for this basename: VITAMINB12:2,FOLATE:2,FERRITIN:2,TIBC:2,IRON:2,RETICCTPCT:2 in the last 72 hours  Coagulation profile  Lab 01/29/11 0418 01/27/11 0506  INR 1.20 1.68*  PROTIME -- --     Basename 01/29/11 0418  DDIMER 2.83*    Cardiac Enzymes  Lab 01/31/11 0035 01/30/11 1435 01/30/11 0930  CKMB 1.8 1.7 1.7  TROPONINI <0.30 <0.30 <0.30  MYOGLOBIN -- --  --   ------------------------------------------------------------------------------------------------------------------ No components found with this basename: POCBNP:3  Micro Results Recent Results (from the past 240 hour(s))  TECHNOLOGIST REVIEW     Status: Normal   Collection Time   01/26/11  1:59 PM      Component Value Range Status Comment   Technologist Review Occas Lymph, Rare Seg   Final   CULTURE, BLOOD (ROUTINE X 2)     Status: Normal (Preliminary result)   Collection Time   01/26/11  5:10 PM      Component Value Range Status Comment   Specimen Description BLOOD   Final    Special Requests BOTTLES DRAWN AEROBIC AND ANAEROBIC   Final    Setup Time [id number]   Final    Culture     Final    Value:        BLOOD CULTURE RECEIVED NO GROWTH TO DATE CULTURE WILL BE HELD FOR 5 DAYS BEFORE ISSUING A FINAL NEGATIVE REPORT   Report Status PENDING   Incomplete   CULTURE, BLOOD (ROUTINE X 2)     Status: Normal (Preliminary result)   Collection Time   01/26/11  5:19 PM      Component Value Range Status Comment   Specimen Description BLOOD RIGHT ARM   Final    Special Requests BOTTLES DRAWN AEROBIC AND ANAEROBIC 5CC   Final    Setup Time [id number]   Final    Culture     Final    Value:        BLOOD CULTURE RECEIVED NO GROWTH TO DATE CULTURE WILL BE HELD FOR 5 DAYS BEFORE ISSUING A FINAL NEGATIVE REPORT   Report Status PENDING   Incomplete   URINE CULTURE     Status: Normal   Collection Time   01/26/11  7:21 PM      Component Value Range Status Comment   Specimen Description URINE, CLEAN CATCH   Final    Special Requests NONE   Final    Setup Time [id number]   Final    Colony Count NO GROWTH   Final    Culture NO GROWTH   Final    Report Status 01/27/2011 FINAL   Final   CULTURE, BLOOD (ROUTINE X 2)     Status: Normal (Preliminary result)   Collection Time   01/26/11  9:40 PM      Component Value Range Status Comment   Specimen Description BLOOD RIGHT HAND   Final     Special Requests BOTTLES DRAWN AEROBIC AND ANAEROBIC 3CC   Final    Setup Time [id number]   Final    Culture     Final    Value:        BLOOD CULTURE RECEIVED NO GROWTH TO DATE CULTURE WILL BE HELD FOR 5 DAYS BEFORE ISSUING A FINAL NEGATIVE REPORT   Report Status PENDING   Incomplete   CULTURE, BLOOD (ROUTINE X 2)     Status: Normal (Preliminary result)   Collection Time   01/26/11  9:45 PM      Component Value  Range Status Comment   Specimen Description BLOOD RIGHT HAND   Final    Special Requests BOTTLES DRAWN AEROBIC ONLY 3CC   Final    Setup Time [id number]   Final    Culture     Final    Value:        BLOOD CULTURE RECEIVED NO GROWTH TO DATE CULTURE WILL BE HELD FOR 5 DAYS BEFORE ISSUING A FINAL NEGATIVE REPORT   Report Status PENDING   Incomplete   MRSA PCR SCREENING     Status: Normal   Collection Time   01/27/11  9:39 PM      Component Value Range Status Comment   MRSA by PCR NEGATIVE  NEGATIVE  Final   CLOSTRIDIUM DIFFICILE BY PCR     Status: Normal   Collection Time   01/28/11  3:50 AM      Component Value Range Status Comment   C difficile by pcr NEGATIVE  NEGATIVE  Final     Radiology Reports Dg Chest 2 View  01/26/2011  *RADIOLOGY REPORT*  Clinical Data: Shortness of breath  CHEST - 2 VIEW  Comparison: 12/10/2010  Findings: Cardiomediastinal silhouette is stable.  Mild thoracic spine osteopenia.  Hazy airspace disease right base suspicious for infiltrate.  Trace left basilar atelectasis or infiltrate.  No pulmonary edema.  IMPRESSION: Hazy airspace disease right base suspicious for infiltrate.  Trace left basilar atelectasis or infiltrate.  No pulmonary edema.  Original Report Authenticated By: Natasha Mead, M.D.   Ct Angio Chest W/cm &/or Wo Cm  01/26/2011  *RADIOLOGY REPORT*  Clinical Data:  Shortness of breath, recent DVT, rule out pulmonary embolus  CT ANGIOGRAPHY CHEST WITH CONTRAST  Technique:  Multidetector CT imaging of the chest was performed using the  standard protocol during bolus administration of intravenous contrast.  Multiplanar CT image reconstructions including MIPs were obtained to evaluate the vascular anatomy.  Contrast: 90mL OMNIPAQUE IOHEXOL 300 MG/ML IV SOLN  Comparison:  12/10/2010  Findings:  Images of the thoracic inlet are unremarkable.  Central airways are patent.  Mild atherosclerotic calcifications of thoracic aorta.  No hilar or mediastinal adenopathy.  Sagittal images of the spine shows mild degenerative changes thoracic spine.  No destructive bony lesions are noted.  Sagittal view of the sternum is unremarkable.  No pulmonary embolus is noted.  Images of the lung parenchyma shows no pulmonary edema.  In axial image 71 there is  small amount of consolidation in the right middle lobe suspicious for infiltrate.  Small amount of triangular- shaped consolidation noted in the left base anteriorly with air bronchogram suspicious for atelectasis or infiltrate.  Mild bronchial thickening noted in the right middle lobe.  The previous retrocrural adenopathy has decreased in size. Measures 3.1 x 2 cm on the prior exam measures 4.5 x 3.1 cm.  Heart size is within normal limits.  Atherosclerotic calcifications of the coronary arteries are noted.  Review of the MIP images confirms the above findings.\   IMPRESSION:  1.  No pulmonary embolus. 2.  There is a small amount of consolidation with air bronchogram in the right middle lobe suspicious for infiltrate.  Small amount of atelectasis or infiltrate noted in the left base anteriorly. 3.  No hilar or mediastinal adenopathy. 4.  Decrease in size of retrocrural adenopathy measures 3.1 x 2 cm. On prior exam measures 4.5 x 3.1 cm. 5.  No pulmonary edema.  Original Report Authenticated By: Natasha Mead, M.D.    Scheduled Meds:    .  Boost Plus  237 mL Oral TID WC  . enoxaparin (LOVENOX) injection  120 mg Subcutaneous Q24H  . fentaNYL  12.5 mcg Transdermal Q72H  . fluconazole  100 mg Oral Daily  .  hydrocortisone sodium succinate  100 mg Intravenous Q8H  . piperacillin-tazobactam (ZOSYN)  IV  3.375 g Intravenous Q8H  . potassium chloride  10 mEq Intravenous Q1 Hr x 4  . potassium chloride  10 mEq Intravenous Q1 Hr x 2  . potassium chloride  40 mEq Oral BID  . potassium chloride  40 mEq Oral TID  . vancomycin  1,250 mg Intravenous Q12H  . DISCONTD: fluconazole (DIFLUCAN) IV  200 mg Intravenous Q24H  . DISCONTD: vancomycin  1,000 mg Intravenous Q8H   Continuous Infusions:   PRN Meds:.acetaminophen, albuterol, iohexol, loperamide, morphine, oxyCODONE, prochlorperazine, prochlorperazine, DISCONTD: morphine  Assessment & Plan   1.Nosocomial PNA - Neutropenic due to Chemo for Seminoma , SIRs -  Continue ABX  + Diflucan for now, afebrile,  ABX D/W Dr Orvan Falconer 01-27-11, cultures remain -ve, HaemOnc following. Neut precautions per protocol. PCCM following via [facility]. Fluid OD resolved post gentle lasix, breathing much better,on stress dose Hydrocortisone for BP assistance(IVF off due to fluid OD), stable EKG and Trop, no pain today, Ct angio -ve for PE. Downgrade to SDU from ICU.   2. ARF (acute renal failure) with hyponatremia  - prerenal in etiology  - resolved with IVF.   3. PNA (pneumonia) -From #1 above, as #1, better.   4. Right leg DVT in the setting of Thrombocytopenia - continue Lovenox per Haem Onc , monitor plts. CT -ve for PE.   5.Hyponatremia - Appears pre renal - Ur Na 23, improved with IVF, since IVF held, will monitor closely, Gatorade PO.   6.Hypokalemia - replaced IV and PO TID stable K and Mag.   7.Chr Diarrhea - -ve C diff. PRN imodium.    DVT Prophylaxis  Lovenox    See all Orders from today for further details  Prognosis guarded , updated dad bedside - 01-30-11  Leroy Sea M.D  01/31/2011, 8:59 AM  [facility] Office  [phone]

## 2011-02-01 LAB — CBC
HCT: 31.8 % — ABNORMAL LOW (ref 39.0–52.0)
Hemoglobin: 11.4 g/dL — ABNORMAL LOW (ref 13.0–17.0)
MCHC: 35.8 g/dL (ref 30.0–36.0)
RBC: 3.7 MIL/uL — ABNORMAL LOW (ref 4.22–5.81)
WBC: 4.7 10*3/uL (ref 4.0–10.5)

## 2011-02-01 LAB — DIFFERENTIAL
Basophils Relative: 1 % (ref 0–1)
Eosinophils Absolute: 0 10*3/uL (ref 0.0–0.7)
Eosinophils Relative: 0 % (ref 0–5)
Lymphocytes Relative: 9 % — ABNORMAL LOW (ref 12–46)
Neutro Abs: 3.5 10*3/uL (ref 1.7–7.7)

## 2011-02-01 LAB — BASIC METABOLIC PANEL
BUN: 20 mg/dL (ref 6–23)
Chloride: 101 mEq/L (ref 96–112)
GFR calc Af Amer: >90 mL/min (ref >90)
Potassium: 4.3 mEq/L (ref 3.5–5.1)

## 2011-02-01 LAB — MAGNESIUM: Magnesium: 2.2 mg/dL (ref 1.5–2.5)

## 2011-02-01 MED ORDER — HYDROCORTISONE SOD SUCCINATE 100 MG IJ SOLR
100.0000 mg | Freq: Two times a day (BID) | INTRAMUSCULAR | Status: DC
  Administered 2011-02-01 – 2011-02-04 (×7): 100 mg via INTRAVENOUS
  Filled 2011-02-01 (×10): qty 2

## 2011-02-01 MED ORDER — FUROSEMIDE 10 MG/ML IJ SOLN
20.0000 mg | Freq: Once | INTRAMUSCULAR | Status: AC
  Administered 2011-02-01: 20 mg via INTRAVENOUS
  Filled 2011-02-01: qty 2

## 2011-02-01 NOTE — Progress Notes (Signed)
ANTICOAGULATION CONSULT NOTE - Follow Up Consult  Pharmacy Consult for Lovenox Indication: hx RLE DVT  No Known Allergies  Patient Measurements: Height: 6' (182.9 cm) Weight: 177 lb 14.6 oz (80.7 kg) IBW/kg (Calculated) : 77.6   Vital Signs: Temp: 95.8 F (35.4 C) (12/25 0800) Temp src: Axillary (12/25 0800) BP: 114/66 mmHg (12/25 0755) Pulse Rate: 56  (12/25 0800)  Labs:  Basename 02/01/11 0215 01/31/11 0315 01/31/11 0035 01/30/11 1435 01/30/11 0930  HGB 11.4* 9.8* -- -- --  HCT 31.8* 27.0* -- 25.6* --  PLT 49* 33* -- 27* --  APTT -- -- -- -- --  LABPROT -- -- -- -- --  INR -- -- -- -- --  HEPARINUNFRC -- -- -- -- --  CREATININE 0.75 0.74 -- 0.76 --  CKTOTAL -- -- 11 11 11   CKMB -- -- 1.8 1.7 1.7  TROPONINI -- -- <0.30 <0.30 <0.30   Estimated Creatinine Clearance: 107.8 ml/min (by C-G formula based on Cr of 0.75).  LMWH level on 12/23 = 0.51 LMWH level on 12/25 = 0.81  Medications:  Scheduled:     . Boost Plus  237 mL Oral TID WC  . enoxaparin (LOVENOX) injection  120 mg Subcutaneous Q24H  . fentaNYL  12.5 mcg Transdermal Q72H  . fluconazole  100 mg Oral Daily  . furosemide  20 mg Intravenous Once  . hydrocortisone sodium succinate  100 mg Intravenous Q12H  . piperacillin-tazobactam (ZOSYN)  IV  3.375 g Intravenous Q8H  . potassium chloride  40 mEq Oral BID  . vancomycin  1,250 mg Intravenous Q12H  . 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 Was on120mg  (1.5mg /kg) SQ q24h per home dose. MD empirically reduced the dose to 100mg  q24h on 12/22 but LMWH level after that dose was only 0.51 so dose was increased back to 120mg  SQ q24 and patient has received 1 dose of 120mg  q24 after 100mg  thus far. LMWH level must be drawn 4 hr after dose is given to evaluate.   Goal of Therapy:  LMWH level 1-2 units/ml 4 hour after dose administration (for dose of 1.5mg /kg q24h)   Plan:  Patient has  been on 120mg  (1.5mg /kg) SQ q24 minus 1 dose of 100mg  on 12/22 PM. With level being only 0.51 after the 100mg ,would check another level 12/25 am (4hr after hs dose) to get an idea of where the 120mg  dose stands in tems of levels. Dose should have been essentially at steady state since patient has been on 120mg  since Lovenox started minus 1 day of 100mg , but level this am 0.81 (goal 1-2 unit/ml) . Options are: 1) continue same dose of 120mg  qhs,recheck level in 1-2 days. If level cont to rise, 120mg  daily is adequate. 2)Increase dose and recheck level at steady state.  Would appreciate Oncology input regarding Lovenox dosing- Will continue same dose 120mg  daily for now, await Onc input.  Otho Bellows PharmD Pager [phone] 02/01/2011 11:38 AM

## 2011-02-01 NOTE — Progress Notes (Addendum)
Dennis [name] UEA:[id number] is a 60 y.o. male,  Outpatient Primary MD for the patient is [name], [name], MD, MD  Chief Complaint  Patient presents with  . Nausea  . Shortness of Breath        Subjective:   Dennis [name] today has, No headache, No chest pain, No abdominal pain - No Nausea, No new weakness tingling or numbness, mild SOB today.  Objective:   Filed Vitals:   02/01/11 0000 02/01/11 0400 02/01/11 0755 02/01/11 0800  BP: 124/68 134/83 114/66   Pulse: 59 57 64 56  Temp: 97.5 F (36.4 C) 96 F (35.6 C)  95.8 F (35.4 C)  TempSrc: Oral Axillary  Axillary  Resp: 17 18 24 25   Height:      Weight:  80.7 kg (177 lb 14.6 oz)    SpO2: 95% 96% 87% 94%    Wt Readings from Last 3 Encounters:  02/01/11 80.7 kg (177 lb 14.6 oz)  01/17/11 85.367 kg (188 lb 3.2 oz)  01/10/11 81.965 kg (180 lb 11.2 oz)     Intake/Output Summary (Last 24 hours) at 02/01/11 0826 Last data filed at 02/01/11 0800  Gross per 24 hour  Intake   1210 ml  Output   1100 ml  Net    110 ml    Exam Awake Alert, Oriented *3, No new F.N deficits, Normal affect Dennis [name].AT,[name] Supple Neck,No JVD, No cervical lymphadenopathy appriciated.  Symmetrical Chest wall movement, Good air movement bilaterally, few rales RRR,No Gallops,Rubs or new Murmurs, No Parasternal Heave +ve B.Sounds, Abd Soft, Non tender, No organomegaly appriciated, No rebound -guarding or rigidity. No Cyanosis, Clubbing or edema, No new Rash or bruise    Data Review  CBC  Lab 02/01/11 0215 01/31/11 0315 01/30/11 1435 01/30/11 0318 01/29/11 0418 01/28/11 0310  WBC 4.7 0.8* 0.4* 0.2* 0.1* --  HGB 11.4* 9.8* 9.4* 9.3* 9.3* --  HCT 31.8* 27.0* 25.6* 25.3* 25.4* --  PLT 49* 33* 27* 26* 23*23* --  MCV 85.9 83.6 83.4 83.5 83.3 --  MCH 30.8 30.3 30.6 30.7 30.5 --  MCHC 35.8 36.3* 36.7* 36.8* 36.6* --  RDW 13.4 12.9 12.8 12.7 12.7 --  LYMPHSABS 0.4* 0.0* -- 0.0* 0.0* 0.0*  MONOABS 0.8 0.0* -- 0.0* 0.0* 0.0*    EOSABS 0.0 0.0 -- 0.0 0.0 0.0  BASOSABS 0.0 0.0 -- 0.0 0.0 0.0  BANDABS -- -- -- -- -- --    Chemistries   Lab 02/01/11 0215 01/31/11 0315 01/30/11 1435 01/30/11 0936 01/30/11 0318 01/28/11 1245 01/26/11 1616  NA 132* 128* 126* 127* 125* -- --  K 4.3 3.9 3.3* 3.0* 3.0* -- --  CL 101 98 95* 96 97 -- --  CO2 24 22 22 21 20  -- --  GLUCOSE 119* 129* 152* 89 71 -- --  BUN 20 12 11 11 11  -- --  CREATININE 0.75 0.74 0.76 0.83 0.80 -- --  CALCIUM 9.0 8.2* 8.0* 8.1* 7.6* -- --  MG 2.2 2.2 -- -- 1.9 2.3 --  AST -- -- -- -- -- -- 14  ALT -- -- -- -- -- -- 12  ALKPHOS -- -- -- -- -- -- 68  BILITOT -- -- -- -- -- -- 1.3*   ------------------------------------------------------------------------------------------------------------------ estimated creatinine clearance is 107.8 ml/min (by C-G formula based on Cr of 0.75). ------------------------------------------------------------------------------------------------------------------ No results found for this basename: HGBA1C:2 in the last 72 hours ------------------------------------------------------------------------------------------------------------------ No results found for this basename: CHOL:2,HDL:2,LDLCALC:2,TRIG:2,CHOLHDL:2,LDLDIRECT:2 in the last 72 hours ------------------------------------------------------------------------------------------------------------------ No results found  for this basename: TSH,T4TOTAL,FREET3,T3FREE,THYROIDAB in the last 72 hours ------------------------------------------------------------------------------------------------------------------ No results found for this basename: VITAMINB12:2,FOLATE:2,FERRITIN:2,TIBC:2,IRON:2,RETICCTPCT:2 in the last 72 hours  Coagulation profile  Lab 01/29/11 0418 01/27/11 0506  INR 1.20 1.68*  PROTIME -- --    No results found for this basename: DDIMER:2 in the last 72 hours  Cardiac Enzymes  Lab 01/31/11 0035 01/30/11 1435 01/30/11 0930  CKMB 1.8 1.7 1.7   TROPONINI <0.30 <0.30 <0.30  MYOGLOBIN -- -- --   ------------------------------------------------------------------------------------------------------------------ No components found with this basename: POCBNP:3  Micro Results Recent Results (from the past 240 hour(s))  TECHNOLOGIST REVIEW     Status: Normal   Collection Time   01/26/11  1:59 PM      Component Value Range Status Comment   Technologist Review Occas Lymph, Rare Seg   Final   CULTURE, BLOOD (ROUTINE X 2)     Status: Normal (Preliminary result)   Collection Time   01/26/11  5:10 PM      Component Value Range Status Comment   Specimen Description BLOOD   Final    Special Requests BOTTLES DRAWN AEROBIC AND ANAEROBIC   Final    Setup Time [id number]   Final    Culture     Final    Value:        BLOOD CULTURE RECEIVED NO GROWTH TO DATE CULTURE WILL BE HELD FOR 5 DAYS BEFORE ISSUING A FINAL NEGATIVE REPORT   Report Status PENDING   Incomplete   CULTURE, BLOOD (ROUTINE X 2)     Status: Normal (Preliminary result)   Collection Time   01/26/11  5:19 PM      Component Value Range Status Comment   Specimen Description BLOOD RIGHT ARM   Final    Special Requests BOTTLES DRAWN AEROBIC AND ANAEROBIC 5CC   Final    Setup Time [id number]   Final    Culture     Final    Value:        BLOOD CULTURE RECEIVED NO GROWTH TO DATE CULTURE WILL BE HELD FOR 5 DAYS BEFORE ISSUING A FINAL NEGATIVE REPORT   Report Status PENDING   Incomplete   URINE CULTURE     Status: Normal   Collection Time   01/26/11  7:21 PM      Component Value Range Status Comment   Specimen Description URINE, CLEAN CATCH   Final    Special Requests NONE   Final    Setup Time [id number]   Final    Colony Count NO GROWTH   Final    Culture NO GROWTH   Final    Report Status 01/27/2011 FINAL   Final   CULTURE, BLOOD (ROUTINE X 2)     Status: Normal (Preliminary result)   Collection Time   01/26/11  9:40 PM      Component Value Range Status Comment    Specimen Description BLOOD RIGHT HAND   Final    Special Requests BOTTLES DRAWN AEROBIC AND ANAEROBIC 3CC   Final    Setup Time [id number]   Final    Culture     Final    Value:        BLOOD CULTURE RECEIVED NO GROWTH TO DATE CULTURE WILL BE HELD FOR 5 DAYS BEFORE ISSUING A FINAL NEGATIVE REPORT   Report Status PENDING   Incomplete   CULTURE, BLOOD (ROUTINE X 2)     Status: Normal (Preliminary result)   Collection Time   01/26/11  9:45 PM  Component Value Range Status Comment   Specimen Description BLOOD RIGHT HAND   Final    Special Requests BOTTLES DRAWN AEROBIC ONLY 3CC   Final    Setup Time [id number]   Final    Culture     Final    Value:        BLOOD CULTURE RECEIVED NO GROWTH TO DATE CULTURE WILL BE HELD FOR 5 DAYS BEFORE ISSUING A FINAL NEGATIVE REPORT   Report Status PENDING   Incomplete   MRSA PCR SCREENING     Status: Normal   Collection Time   01/27/11  9:39 PM      Component Value Range Status Comment   MRSA by PCR NEGATIVE  NEGATIVE  Final   CLOSTRIDIUM DIFFICILE BY PCR     Status: Normal   Collection Time   01/28/11  3:50 AM      Component Value Range Status Comment   C difficile by pcr NEGATIVE  NEGATIVE  Final   PATHOLOGIST SMEAR REVIEW     Status: Normal   Collection Time   01/30/11  3:18 AM      Component Value Range Status Comment   Tech Review Reviewed By Havery Moros, M.D.   Final     Radiology Reports Dg Chest 2 View  01/26/2011  *RADIOLOGY REPORT*  Clinical Data: Shortness of breath  CHEST - 2 VIEW  Comparison: 12/10/2010  Findings: Cardiomediastinal silhouette is stable.  Mild thoracic spine osteopenia.  Hazy airspace disease right base suspicious for infiltrate.  Trace left basilar atelectasis or infiltrate.  No pulmonary edema.  IMPRESSION: Hazy airspace disease right base suspicious for infiltrate.  Trace left basilar atelectasis or infiltrate.  No pulmonary edema.  Original Report Authenticated By: Natasha Mead, M.D.   Ct Angio Chest W/cm &/or  Wo Cm  01/26/2011  *RADIOLOGY REPORT*  Clinical Data:  Shortness of breath, recent DVT, rule out pulmonary embolus  CT ANGIOGRAPHY CHEST WITH CONTRAST  Technique:  Multidetector CT imaging of the chest was performed using the standard protocol during bolus administration of intravenous contrast.  Multiplanar CT image reconstructions including MIPs were obtained to evaluate the vascular anatomy.  Contrast: 90mL OMNIPAQUE IOHEXOL 300 MG/ML IV SOLN  Comparison:  12/10/2010  Findings:  Images of the thoracic inlet are unremarkable.  Central airways are patent.  Mild atherosclerotic calcifications of thoracic aorta.  No hilar or mediastinal adenopathy.  Sagittal images of the spine shows mild degenerative changes thoracic spine.  No destructive bony lesions are noted.  Sagittal view of the sternum is unremarkable.  No pulmonary embolus is noted.  Images of the lung parenchyma shows no pulmonary edema.  In axial image 71 there is  small amount of consolidation in the right middle lobe suspicious for infiltrate.  Small amount of triangular- shaped consolidation noted in the left base anteriorly with air bronchogram suspicious for atelectasis or infiltrate.  Mild bronchial thickening noted in the right middle lobe.  The previous retrocrural adenopathy has decreased in size. Measures 3.1 x 2 cm on the prior exam measures 4.5 x 3.1 cm.  Heart size is within normal limits.  Atherosclerotic calcifications of the coronary arteries are noted.  Review of the MIP images confirms the above findings.\   IMPRESSION:  1.  No pulmonary embolus. 2.  There is a small amount of consolidation with air bronchogram in the right middle lobe suspicious for infiltrate.  Small amount of atelectasis or infiltrate noted in the left base anteriorly. 3.  No  hilar or mediastinal adenopathy. 4.  Decrease in size of retrocrural adenopathy measures 3.1 x 2 cm. On prior exam measures 4.5 x 3.1 cm. 5.  No pulmonary edema.  Original Report Authenticated  By: Natasha Mead, M.D.    Scheduled Meds:    . Boost Plus  237 mL Oral TID WC  . enoxaparin (LOVENOX) injection  120 mg Subcutaneous Q24H  . fentaNYL  12.5 mcg Transdermal Q72H  . fluconazole  100 mg Oral Daily  . furosemide  20 mg Intravenous Once  . hydrocortisone sodium succinate  100 mg Intravenous Q12H  . piperacillin-tazobactam (ZOSYN)  IV  3.375 g Intravenous Q8H  . potassium chloride  40 mEq Oral BID  . vancomycin  1,250 mg Intravenous Q12H  . 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 Neut precautions per protocol. PCCM following via El[facility]. Fluid OD resolved post gentle lasix 01-30-11 was breathing much better ,on stress dose Hydrocortisone for BP assistance(IVF off due to fluid OD), stable EKG and Trop, mild cough related musculoskeletal chest pain with -ve Ct angio for PE. Now Few more rales again and mildly more SOB, will give 1 dose gentle Lasix, Echo today, CXR repeat in am.   2. ARF (acute renal failure) with hyponatremia  - prerenal in etiology  - resolved with IVF.   3. PNA (pneumonia) -From #1 above, as #1, better.   4. Right leg DVT in the setting of Thrombocytopenia - continue Lovenox per Haem Onc , monitor plts. CT -ve for PE.   5.Hyponatremia - Appears pre renal - Ur Na 23, improved with IVF, since IVF held, will monitor closely, Gatorade PO.   6.Hypokalemia - replaced IV and PO resolved.   7.Chr Diarrhea - -ve C diff. PRN imodium.    DVT Prophylaxis  Lovenox    See all Orders from today for further details  Prognosis guarded , updated dad bedside - 01-30-11,  01-31-11.  Leroy Sea M.D  02/01/2011, 8:26 AM  [facility] Office  [phone]

## 2011-02-01 NOTE — Progress Notes (Signed)
  Echocardiogram 2D Echocardiogram has been performed.  Juanita Laster Lesslie Mossa, RDCS 02/01/2011, 10:19 AM

## 2011-02-02 ENCOUNTER — Inpatient Hospital Stay (HOSPITAL_COMMUNITY): Payer: 59

## 2011-02-02 LAB — CULTURE, BLOOD (ROUTINE X 2)
Culture  Setup Time: 201212200214
Culture  Setup Time: 201212200214
Culture  Setup Time: 201212200217
Culture: NO GROWTH
Culture: NO GROWTH

## 2011-02-02 LAB — BASIC METABOLIC PANEL
BUN: 27 mg/dL — ABNORMAL HIGH (ref 6–23)
Creatinine, Ser: 0.8 mg/dL (ref 0.50–1.35)
GFR calc Af Amer: >90 mL/min (ref >90)
GFR calc non Af Amer: >90 mL/min (ref >90)
Glucose, Bld: 99 mg/dL (ref 70–99)

## 2011-02-02 LAB — DIFFERENTIAL
Basophils Absolute: 0 10*3/uL (ref 0.0–0.1)
Basophils Relative: 0 % (ref 0–1)
Eosinophils Absolute: 0 10*3/uL (ref 0.0–0.7)
Eosinophils Relative: 0 % (ref 0–5)
Lymphocytes Relative: 12 % (ref 12–46)
Lymphs Abs: 1.7 10*3/uL (ref 0.7–4.0)
Monocytes Absolute: 1.6 10*3/uL — ABNORMAL HIGH (ref 0.1–1.0)
Monocytes Relative: 11 % (ref 3–12)
Neutro Abs: 11 10*3/uL — ABNORMAL HIGH (ref 1.7–7.7)
Neutrophils Relative %: 77 % (ref 43–77)
nRBC: 0 /100 WBC

## 2011-02-02 LAB — CBC
Hemoglobin: 10.4 g/dL — ABNORMAL LOW (ref 13.0–17.0)
MCH: 30.2 pg (ref 26.0–34.0)
MCHC: 35.1 g/dL (ref 30.0–36.0)
RDW: 13.6 % (ref 11.5–15.5)

## 2011-02-02 LAB — MAGNESIUM: Magnesium: 2 mg/dL (ref 1.5–2.5)

## 2011-02-02 MED ORDER — SODIUM CHLORIDE 0.9 % IV SOLN
INTRAVENOUS | Status: DC
  Administered 2011-02-02: 10 mL/h via INTRAVENOUS
  Administered 2011-02-03: 07:00:00 via INTRAVENOUS

## 2011-02-02 MED ORDER — PANTOPRAZOLE SODIUM 40 MG PO TBEC
40.0000 mg | DELAYED_RELEASE_TABLET | Freq: Every day | ORAL | Status: DC
  Administered 2011-02-02 – 2011-02-07 (×6): 40 mg via ORAL
  Filled 2011-02-02 (×7): qty 1

## 2011-02-02 NOTE — Progress Notes (Signed)
ANTIBIOTIC CONSULT NOTE - FOLLOW UP  Pharmacy Consult for Vanc/Zosyn Indication: HCAP  No Known Allergies  Patient Measurements: Height: 6' (182.9 cm) Weight: 179 lb 10.8 oz (81.5 kg) IBW/kg (Calculated) : 77.6   Vital Signs: Temp: 97.9 F (36.6 C) (12/26 0800) Temp src: Oral (12/26 0800) BP: 120/67 mmHg (12/26 0400) Pulse Rate: 52  (12/26 0800) Intake/Output from previous day: 12/25 0701 - 12/26 0700 In: 750 [P.O.:100; IV Piggyback:650] Out: 1850 [Urine:1850] Intake/Output from this shift:    Labs:  [facility] 02/02/11 0318 02/01/11 0215 01/31/11 0315  WBC 14.3* 4.7 0.8*  HGB 10.4* 11.4* 9.8*  PLT 41* 49* 33*  LABCREA -- -- --  CREATININE 0.80 0.75 0.74   Estimated Creatinine Clearance: 107.8 ml/min (by C-G formula based on Cr of 0.8).  Basename 01/30/11 2130  VANCOTROUGH 21.9*  VANCOPEAK --  Drue Dun --  GENTTROUGH --  GENTPEAK --  GENTRANDOM --  TOBRATROUGH --  TOBRAPEAK --  TOBRARND --  AMIKACINPEAK --  AMIKACINTROU --  AMIKACIN --     Microbiology: Recent Results (from the past 720 hour(s))  TECHNOLOGIST REVIEW     Status: Normal   Collection Time   01/26/11  1:59 PM      Component Value Range Status Comment   Technologist Review Occas Lymph, Rare Seg   Final   CULTURE, BLOOD (ROUTINE X 2)     Status: Normal   Collection Time   01/26/11  5:10 PM      Component Value Range Status Comment   Specimen Description BLOOD   Final    Special Requests BOTTLES DRAWN AEROBIC AND ANAEROBIC   Final    Setup Time 201212200214   Final    Culture NO GROWTH 5 DAYS   Final    Report Status 02/02/2011 FINAL   Final   CULTURE, BLOOD (ROUTINE X 2)     Status: Normal   Collection Time   01/26/11  5:19 PM      Component Value Range Status Comment   Specimen Description BLOOD RIGHT ARM   Final    Special Requests BOTTLES DRAWN AEROBIC AND ANAEROBIC 5CC   Final    Setup Time [id number]   Final    Culture NO GROWTH 5 DAYS   Final    Report Status 02/02/2011  FINAL   Final   URINE CULTURE     Status: Normal   Collection Time   01/26/11  7:21 PM      Component Value Range Status Comment   Specimen Description URINE, CLEAN CATCH   Final    Special Requests NONE   Final    Setup Time [id number]   Final    Colony Count NO GROWTH   Final    Culture NO GROWTH   Final    Report Status 01/27/2011 FINAL   Final   CULTURE, BLOOD (ROUTINE X 2)     Status: Normal   Collection Time   01/26/11  9:40 PM      Component Value Range Status Comment   Specimen Description BLOOD RIGHT HAND   Final    Special Requests BOTTLES DRAWN AEROBIC AND ANAEROBIC 3CC   Final    Setup Time [id number]   Final    Culture NO GROWTH 5 DAYS   Final    Report Status 02/02/2011 FINAL   Final   CULTURE, BLOOD (ROUTINE X 2)     Status: Normal   Collection Time   01/26/11  9:45 PM  Component Value Range Status Comment   Specimen Description BLOOD RIGHT HAND   Final    Special Requests BOTTLES DRAWN AEROBIC ONLY 3CC   Final    Setup Time [id number]   Final    Culture NO GROWTH 5 DAYS   Final    Report Status 02/02/2011 FINAL   Final   MRSA PCR SCREENING     Status: Normal   Collection Time   01/27/11  9:39 PM      Component Value Range Status Comment   MRSA by PCR NEGATIVE  NEGATIVE  Final   CLOSTRIDIUM DIFFICILE BY PCR     Status: Normal   Collection Time   01/28/11  3:50 AM      Component Value Range Status Comment   C difficile by pcr NEGATIVE  NEGATIVE  Final   PATHOLOGIST SMEAR REVIEW     Status: Normal   Collection Time   01/30/11  3:18 AM      Component Value Range Status Comment   Tech Review Reviewed By Havery Moros, M.D.   Final     Anti-infectives     Start     Dose/Rate Route Frequency Ordered Stop   01/31/11 1000   vancomycin (VANCOCIN) 1,250 mg in sodium chloride 0.9 % 250 mL IVPB        1,250 mg 166.7 mL/hr over 90 Minutes Intravenous Every 12 hours 01/30/11 2250     01/30/11 1100   fluconazole (DIFLUCAN) tablet 100 mg        100 mg  Oral Daily 01/30/11 1010     01/29/11 1400   vancomycin (VANCOCIN) IVPB 1000 mg/200 mL premix  Status:  Discontinued        1,000 mg 200 mL/hr over 60 Minutes Intravenous Every 8 hours 01/29/11 1200 01/30/11 2249   01/29/11 1200   fluconazole (DIFLUCAN) IVPB 200 mg  Status:  Discontinued        200 mg 100 mL/hr over 60 Minutes Intravenous Every 24 hours 01/29/11 1009 01/30/11 1010   01/27/11 0815   ciprofloxacin (CIPRO) IVPB 400 mg  Status:  Discontinued        400 mg 200 mL/hr over 60 Minutes Intravenous Every 12 hours 01/27/11 0802 01/27/11 0956   01/27/11 0600   vancomycin (VANCOCIN) IVPB 1000 mg/200 mL premix  Status:  Discontinued        1,000 mg 200 mL/hr over 60 Minutes Intravenous Every 12 hours 01/26/11 2119 01/29/11 1159   01/27/11 0200  piperacillin-tazobactam (ZOSYN) IVPB 3.375 g       3.375 g 12.5 mL/hr over 240 Minutes Intravenous Every 8 hours 01/26/11 2119     01/26/11 2200   piperacillin-tazobactam (ZOSYN) IVPB 2.25 g  Status:  Discontinued        2.25 g 100 mL/hr over 30 Minutes Intravenous 3 times per day 01/26/11 2104 01/26/11 2118   01/26/11 2115   vancomycin (VANCOCIN) IVPB 1000 mg/200 mL premix  Status:  Discontinued        1,000 mg 200 mL/hr over 60 Minutes Intravenous Every 12 hours 01/26/11 2104 01/26/11 2108   01/26/11 1715   vancomycin (VANCOCIN) IVPB 1000 mg/200 mL premix        1,000 mg 200 mL/hr over 60 Minutes Intravenous To Emergency Dept 01/26/11 1708 01/26/11 1833   01/26/11 1715  piperacillin-tazobactam (ZOSYN) IVPB 4.5 g       4.5 g 200 mL/hr over 30 Minutes Intravenous To Emergency Dept 01/26/11 1708 01/26/11 1919  Assessment:  60 YOM admitted 12/19 with nausea, diarrhea, SOB.  Request by outpatient MD to be admitted s/p labs showed neutropenia, hyponatremia (Na 119)  D7 Vanc 1250 mg IV q12h and Zosyn EI for HCAP (recent chemo), neutropenic fever.  D5 Diflucan PO for thrush. On solu-cortef. Vanc trough obtained and dose  adjusted 12/23.   Per ONC, ok to d/c abx when ANC >= 1 if remains afebrile and cxs remain neg.  Pt is afebrile but WBC now up to 14.3, stable renal fxn.  12/21 C.diff negative 12/19 Blcx x 2 sets = neg 12/19 Ucx neg  Goal of Therapy:  Vancomycin trough level 15-20 mcg/ml  Plan:   Continue Vanc at 1250 mg IV q12h and Zosyn EI (infuse over 4 hours)  Will f/u WBC, temp, cxs, renal fxn  Geoffry Paradise Thi 02/02/2011,9:23 AM

## 2011-02-02 NOTE — Progress Notes (Signed)
Dennis [name] ZOX:[id number] is a 60 y.o. male,  Outpatient Primary MD for the patient is [name], [name], MD, MD  Chief Complaint  Patient presents with  . Nausea  . Shortness of Breath        Subjective:   Dennis [name] today is feeling better. His breathing is easier. Minimal cough. He is still weak, although he this is starting to improve. No chest pain.  Objective:   Filed Vitals:   02/02/11 0400 02/02/11 0500 02/02/11 0800 02/02/11 1045  BP: 120/67  124/74 138/83  Pulse: 54  52 62  Temp: 97.5 F (36.4 C)  97.9 F (36.6 C) 97.4 F (36.3 C)  TempSrc: Oral  Oral Oral  Resp: 17  17 16   Height:    6' (1.829 m)  Weight:  81.5 kg (179 lb 10.8 oz)  81.647 kg (180 lb)  SpO2: 91%  97% 97%    Wt Readings from Last 3 Encounters:  02/02/11 81.647 kg (180 lb)  01/17/11 85.367 kg (188 lb 3.2 oz)  01/10/11 81.965 kg (180 lb 11.2 oz)     Intake/Output Summary (Last 24 hours) at 02/02/11 1344 Last data filed at 02/02/11 1327  Gross per 24 hour  Intake   1480 ml  Output    950 ml  Net    530 ml    Exam Awake Alert, Oriented *3, No new F.N deficits, Normal affect [place].AT,[name] Supple Neck,No JVD, No cervical lymphadenopathy appriciated.  Symmetrical Chest wall movement, Good air movement bilaterally, few rales RRR,No Gallops,Rubs or new Murmurs, No Parasternal Heave +ve B.Sounds, Abd Soft, Non tender, No organomegaly appriciated, No rebound -guarding or rigidity. No Cyanosis, Clubbing or edema, No new Rash or bruise    Data Review  CBC  Lab 02/02/11 0318 02/01/11 0215 01/31/11 0315 01/30/11 1435 01/30/11 0318 01/29/11 0418  WBC 14.3* 4.7 0.8* 0.4* 0.2* --  HGB 10.4* 11.4* 9.8* 9.4* 9.3* --  HCT 29.6* 31.8* 27.0* 25.6* 25.3* --  PLT 41* 49* 33* 27* 26* --  MCV 86.0 85.9 83.6 83.4 83.5 --  MCH 30.2 30.8 30.3 30.6 30.7 --  MCHC 35.1 35.8 36.3* 36.7* 36.8* --  RDW 13.6 13.4 12.9 12.8 12.7 --  LYMPHSABS 1.7 0.4* 0.0* -- 0.0* 0.0*  MONOABS 1.6* 0.8  0.0* -- 0.0* 0.0*  EOSABS 0.0 0.0 0.0 -- 0.0 0.0  BASOSABS 0.0 0.0 0.0 -- 0.0 0.0  BANDABS -- -- -- -- -- --    Chemistries   Lab 02/02/11 0318 02/01/11 0215 01/31/11 0315 01/30/11 1435 01/30/11 0936 01/30/11 0318 01/28/11 1245 01/26/11 1616  NA 134* 132* 128* 126* 127* -- -- --  K 4.2 4.3 3.9 3.3* 3.0* -- -- --  CL 104 101 98 95* 96 -- -- --  CO2 23 24 22 22 21  -- -- --  GLUCOSE 99 119* 129* 152* 89 -- -- --  BUN 27* 20 12 11 11  -- -- --  CREATININE 0.80 0.75 0.74 0.76 0.83 -- -- --  CALCIUM 8.7 9.0 8.2* 8.0* 8.1* -- -- --  MG 2.0 2.2 2.2 -- -- 1.9 2.3 --  AST -- -- -- -- -- -- -- 14  ALT -- -- -- -- -- -- -- 12  ALKPHOS -- -- -- -- -- -- -- 68  BILITOT -- -- -- -- -- -- -- 1.3*    Coagulation profile  Lab 01/29/11 0418 01/27/11 0506  INR 1.20 1.68*  PROTIME -- --    No results found for this  basename: DDIMER:2 in the last 72 hours  Cardiac Enzymes  Lab 01/31/11 0035 01/30/11 1435 01/30/11 0930  CKMB 1.8 1.7 1.7  TROPONINI <0.30 <0.30 <0.30  MYOGLOBIN -- -- --   ----------------- Scheduled Meds:    . Boost Plus  237 mL Oral TID WC  . enoxaparin (LOVENOX) injection  120 mg Subcutaneous Q24H  . fentaNYL  12.5 mcg Transdermal Q72H  . fluconazole  100 mg Oral Daily  . hydrocortisone sodium succinate  100 mg Intravenous Q12H  . piperacillin-tazobactam (ZOSYN)  IV  3.375 g Intravenous Q8H  . potassium chloride  40 mEq Oral BID  . vancomycin  1,250 mg Intravenous Q12H   Continuous Infusions:    . 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 Neutropenia looks to have resolved stable, responding well to Lasix. ,on stress dose  Hydrocortisone for BP assistance(IVF off due to fluid OD), stable EKG and Trop, mild cough related musculoskeletal chest pain with -ve Ct angio for PE. Echocardiogram done this morning shows normal systolic and diastolic function. Chest x-ray this morning notes improved airway disease. Transfer to floor.  2. ARF (acute renal failure) with hyponatremia  - prerenal in etiology  - resolved with IVF.   3. PNA (pneumonia) -From #1 above, as #1, better.   4. Right leg DVT in the setting of Thrombocytopenia - continue Lovenox per Haem Onc , monitor plts. CT -ve for PE.  5.Hyponatremia - Appears pre renal - Ur Na 23, improved with IVF, since IVF held, will monitor closely, Gatorade PO. No resolved   6.Hypokalemia - replaced IV and PO resolved.   7.Chr Diarrhea - negative for C diff. PRN imodium.    DVT Prophylaxis  Lovenox    See all Orders from today for further details  Prognosis guarded , updated dad bedside - 01-30-11, 01-31-11.  Hollice Espy M.D  02/02/2011, 1:44 PM  [facility] Office  [phone]

## 2011-02-03 LAB — DIFFERENTIAL
Basophils Absolute: 0 10*3/uL (ref 0.0–0.1)
Basophils Relative: 0 % (ref 0–1)
Eosinophils Absolute: 0 10*3/uL (ref 0.0–0.7)
Eosinophils Relative: 0 % (ref 0–5)
Metamyelocytes Relative: 0 %
Myelocytes: 0 %
Neutro Abs: 17.4 10*3/uL — ABNORMAL HIGH (ref 1.7–7.7)
Neutrophils Relative %: 79 % — ABNORMAL HIGH (ref 43–77)
Promyelocytes Absolute: 0 %
nRBC: 0 /100 WBC

## 2011-02-03 LAB — CBC
Platelets: 63 10*3/uL — ABNORMAL LOW (ref 150–400)
RBC: 3.38 MIL/uL — ABNORMAL LOW (ref 4.22–5.81)
RDW: 14 % (ref 11.5–15.5)
WBC: 22.1 10*3/uL — ABNORMAL HIGH (ref 4.0–10.5)

## 2011-02-03 LAB — BASIC METABOLIC PANEL
Calcium: 8.4 mg/dL (ref 8.4–10.5)
Creatinine, Ser: 0.85 mg/dL (ref 0.50–1.35)
GFR calc Af Amer: >90 mL/min (ref >90)
GFR calc non Af Amer: >90 mL/min (ref >90)
Sodium: 137 mEq/L (ref 135–145)

## 2011-02-03 LAB — PATHOLOGIST SMEAR REVIEW

## 2011-02-03 LAB — PRO B NATRIURETIC PEPTIDE: Pro B Natriuretic peptide (BNP): 224.4 pg/mL — ABNORMAL HIGH (ref 0–125)

## 2011-02-03 LAB — MAGNESIUM: Magnesium: 2.1 mg/dL (ref 1.5–2.5)

## 2011-02-03 NOTE — Progress Notes (Signed)
CONTACTED [facility] FOR HHPT, FAXED REFERRAL.

## 2011-02-03 NOTE — Progress Notes (Signed)
Physical Therapy Evaluation Patient Details Name: Dennis [name] MRN: [id number] DOB: Jun 09, 1950 Today's Date: 02/03/2011 Time: 4098-1191 Charge: EVII  Problem List:  Patient Active Problem List  Diagnoses  . Seminoma  . Right leg DVT  . ARF (acute renal failure)  . Neutropenic fever  . PNA (pneumonia)  . Thrombocytopenia  . Thrush    Past Medical History:  Past Medical History  Diagnosis Date  . Cancer     right testicle, behind stomach   Past Surgical History:  Past Surgical History  Procedure Date  . Appendectomy     PT Assessment/Plan/Recommendation PT Assessment Clinical Impression Statement: Pt would benefit from acute PT services in order to improve independence with transfers and ambulation and increase endurance in order to d/c to parents house. PT Recommendation/Assessment: Patient will need skilled PT in the acute care venue PT Problem List: Decreased activity tolerance;Decreased mobility;Cardiopulmonary status limiting activity PT Therapy Diagnosis : Difficulty walking (SOB with mobility) PT Plan PT Frequency: Min 3X/week PT Treatment/Interventions: DME instruction;Gait training;Stair training;Functional mobility training;Therapeutic exercise;Therapeutic activities;Patient/family education PT Recommendation Follow Up Recommendations: Home health PT Equipment Recommended: None recommended by PT PT Goals  Acute Rehab PT Goals PT Goal Formulation: With patient Time For Goal Achievement: 7 days Pt will go Sit to Stand: with modified independence PT Goal: Sit to Stand - Progress: Not met Pt will Ambulate: >150 feet;with modified independence;with least restrictive assistive device PT Goal: Ambulate - Progress: Not met Pt will Go Up / Down Stairs: 1-2 stairs;with modified independence;with least restrictive assistive device;with rail(s) PT Goal: Up/Down Stairs - Progress: Not met Pt will Perform Home Exercise Program: with supervision, verbal cues  required/provided PT Goal: Perform Home Exercise Program - Progress: Not met  PT Evaluation Precautions/Restrictions  Precautions Precautions: Fall Prior Functioning  Home Living Lives With: Alone Receives Help From: Family Type of Home: House Home Layout: Able to live on main level with bedroom/bathroom Home Access: Stairs to enter Entrance Stairs-Rails: Can reach both Entrance Stairs-Number of Steps: 2 Home Adaptive Equipment: Straight cane Additional Comments: Pt reports he has been living with his parents since starting chemo.  The above info is his parents home as this is where he plans to d/c. Prior Function Level of Independence: Independent with basic ADLs;Requires assistive device for independence Comments: Pt uses straight cane for mobility Cognition Cognition Arousal/Alertness: Awake/alert Overall Cognitive Status: Appears within functional limits for tasks assessed Sensation/Coordination   Extremity Assessment RLE Assessment RLE Assessment: Within Functional Limits LLE Assessment LLE Assessment: Within Functional Limits Mobility (including Balance) Bed Mobility Bed Mobility: Yes Supine to Sit: 6: Modified independent (Device/Increase time);HOB elevated (Comment degrees) Transfers Transfers: Yes Sit to Stand: 4: Min assist;From bed Sit to Stand Details (indicate cue type and reason): min/guard Stand to Sit: 4: Min assist;To chair/3-in-1;With armrests Stand to Sit Details: min/guard Ambulation/Gait Ambulation/Gait: Yes Ambulation/Gait Assistance: 4: Min assist Ambulation/Gait Assistance Details (indicate cue type and reason): min/guard, verbal cues for pursed lip breathing with short rest breaks 2* fatigue and SOB Ambulation Distance (Feet): 280 Feet Assistive device:  (pushing IV pole) Gait Pattern: Decreased stride length;Step-through pattern    Pt with SaO2 decreasing to 85% on room air with ambulation, but increases quickly with pursed lip breathing back  up to 90%. Exercise    End of Session PT - End of Session Activity Tolerance: Patient limited by fatigue Patient left: in chair;with call bell in reach General Behavior During Session: [facility] for tasks performed Cognition: [facility] for tasks performed  [facility]  E 02/03/2011, 11:19 AM Pager: [phone]

## 2011-02-03 NOTE — Progress Notes (Signed)
Dennis [name] ZOX:[id number] is a 60 y.o. male,  Outpatient Primary MD for the patient is [name], [name], MD, MD  Chief Complaint  Patient presents with  . Nausea  . Shortness of Breath        Subjective:   Dennis [name] continues to still better. His breathing is okay on room air. No chest pain. Mild semi-productive cough. No fevers.  Objective:   Filed Vitals:   02/02/11 2145 02/03/11 0628 02/03/11 1103 02/03/11 1400  BP: 155/79 147/72  124/78  Pulse: 66 59  54  Temp: 97.7 F (36.5 C) 97.4 F (36.3 C)  97.5 F (36.4 C)  TempSrc: Oral Oral  Oral  Resp: 20 20  18   Height:      Weight:      SpO2: 90% 95% 90% 90%    Wt Readings from Last 3 Encounters:  02/02/11 81.647 kg (180 lb)  01/17/11 85.367 kg (188 lb 3.2 oz)  01/10/11 81.965 kg (180 lb 11.2 oz)     Intake/Output Summary (Last 24 hours) at 02/03/11 2118 Last data filed at 02/03/11 1800  Gross per 24 hour  Intake    600 ml  Output    425 ml  Net    175 ml    Exam General: Alert and oriented x3, no apparent distress, looks much less fatigue, looks older than stated age HEENT: Normocephalic, atraumatic, mucous membranes are moist Cardiovascular: Regular rate and rhythm, S1-S2 Lungs: Decreased breath sounds at the bases Abdomen: Soft, nontender, nondistended, positive bowel sounds Extremities: No clubbing cyanosis or edema  Data Review  CBC  Lab 02/03/11 0415 02/02/11 0318 02/01/11 0215 01/31/11 0315 01/30/11 1435 01/30/11 0318  WBC 22.1* 14.3* 4.7 0.8* 0.4* --  HGB 10.4* 10.4* 11.4* 9.8* 9.4* --  HCT 29.6* 29.6* 31.8* 27.0* 25.6* --  PLT 63* 41* 49* 33* 27* --  MCV 87.6 86.0 85.9 83.6 83.4 --  MCH 30.8 30.2 30.8 30.3 30.6 --  MCHC 35.1 35.1 35.8 36.3* 36.7* --  RDW 14.0 13.6 13.4 12.9 12.8 --  LYMPHSABS 2.0 1.7 0.4* 0.0* -- 0.0*  MONOABS 2.7* 1.6* 0.8 0.0* -- 0.0*  EOSABS 0.0 0.0 0.0 0.0 -- 0.0  BASOSABS 0.0 0.0 0.0 0.0 -- 0.0  BANDABS -- -- -- -- -- --    Chemistries    Lab 02/03/11 0415 02/02/11 0318 02/01/11 0215 01/31/11 0315 01/30/11 1435 01/30/11 0318  NA 137 134* 132* 128* 126* --  K 4.3 4.2 4.3 3.9 3.3* --  CL 106 104 101 98 95* --  CO2 24 23 24 22 22  --  GLUCOSE 99 99 119* 129* 152* --  BUN 27* 27* 20 12 11  --  CREATININE 0.85 0.80 0.75 0.74 0.76 --  CALCIUM 8.4 8.7 9.0 8.2* 8.0* --  MG 2.1 2.0 2.2 2.2 -- 1.9  AST -- -- -- -- -- --  ALT -- -- -- -- -- --  ALKPHOS -- -- -- -- -- --  BILITOT -- -- -- -- -- --   ----------------- Scheduled Meds:    . Boost Plus  237 mL Oral TID WC  . enoxaparin (LOVENOX) injection  120 mg Subcutaneous Q24H  . fentaNYL  12.5 mcg Transdermal Q72H  . fluconazole  100 mg Oral Daily  . hydrocortisone sodium succinate  100 mg Intravenous Q12H  . pantoprazole  40 mg Oral Q1200  . piperacillin-tazobactam (ZOSYN)  IV  3.375 g Intravenous Q8H  . potassium chloride  40 mEq Oral BID  . vancomycin  1,250 mg Intravenous Q12H   Continuous Infusions:    . sodium chloride 10 mL/hr at 02/03/11 1800   PRN Meds:.acetaminophen, albuterol, loperamide, morphine, oxyCODONE, prochlorperazine, prochlorperazine  Assessment & Plan   1.Nosocomial PNA - Neutropenic due to Chemo for Seminoma complicated by SIRs and mild fluid overload-  Continue ABX  + Diflucan for now, afebrile,  ABX D/W Dr Orvan Falconer 01-27-11, cultures remain negative, HaemOnc following. Neutropenia looks to have resolved stable, responding well to Lasix. ,on stress dose Hydrocortisone for BP assistance(IVF off due to fluid OD), stable EKG and Trop, mild cough related musculoskeletal chest pain with -ve Ct angio for PE. Echocardiogram done this morning shows normal systolic and diastolic function.  2. Leukocytosis-neutropenia has clearly resolved. His white blood cell count however continues to climb. I suspect that likely this is from the Neulasta although he received a several weeks ago. I do not see any signs of new infection as his oxygenation is stable,  chest x-ray from yesterday shows improving airway disease, he is on Zosyn and vancomycin and his urine looks okay. He has some loose stools but no significant signs of infectious diarrhea. And his C. difficile PCR is negative. We'll plan to discuss this with oncology, and wants to get his blessing he can be discharged. If there is concern for further infection, would change his Zosyn to cefepime plus fluoroquinolone.  3. ARF (acute renal failure) with hyponatremia  - prerenal in etiology  - resolved with IVF.   4. Right leg DVT in the setting of Thrombocytopenia - continue Lovenox per Haem Onc , monitor plts. CT negative for pulmonary embolus. He'll go home on Lovenox, does not need Coumadin.  5.Hyponatremia - Appears pre renal - Ur Na 23, improved with IVF, since IVF held, will monitor closely, Gatorade PO. Now resolved   6.Hypokalemia - replaced IV and PO resolved.   7.Chr Diarrhea - negative for C diff. PRN imodium.    DVT Prophylaxis  Lovenox    See all Orders from today for further details  Prognosis improved. Updated patient's father today-12/27  Hollice Espy M.D  02/03/2011, 9:18 PM  [facility]  [phone]

## 2011-02-04 LAB — DIFFERENTIAL
Basophils Absolute: 0 10*3/uL (ref 0.0–0.1)
Basophils Relative: 0 % (ref 0–1)
Eosinophils Absolute: 0 10*3/uL (ref 0.0–0.7)
Eosinophils Relative: 0 % (ref 0–5)
Lymphocytes Relative: 18 % (ref 12–46)
Lymphs Abs: 5.3 10*3/uL — ABNORMAL HIGH (ref 0.7–4.0)
Monocytes Absolute: 0.9 10*3/uL (ref 0.1–1.0)
Monocytes Relative: 3 % (ref 3–12)
Neutro Abs: 23.1 10*3/uL — ABNORMAL HIGH (ref 1.7–7.7)
Neutrophils Relative %: 79 % — ABNORMAL HIGH (ref 43–77)

## 2011-02-04 LAB — CBC
HCT: 29.5 % — ABNORMAL LOW (ref 39.0–52.0)
MCHC: 35.6 g/dL (ref 30.0–36.0)
Platelets: 112 10*3/uL — ABNORMAL LOW (ref 150–400)
RDW: 14.3 % (ref 11.5–15.5)

## 2011-02-04 MED ORDER — HYDROCORTISONE SOD SUCCINATE 100 MG IJ SOLR
25.0000 mg | Freq: Two times a day (BID) | INTRAMUSCULAR | Status: AC
  Administered 2011-02-05 (×3): 25 mg via INTRAVENOUS
  Filled 2011-02-04 (×5): qty 0.5

## 2011-02-04 NOTE — Progress Notes (Signed)
UR completed 

## 2011-02-04 NOTE — Progress Notes (Addendum)
Nutrition Follow-up  Diet Order:  Heart Healthy  Pt has started refusing meals, stating that all foods are bland and the only thing he currently enjoys eating is chocolate ice cream, potato chips, peanuts, and juice.  Dad expresses concern that pt is refusing meals.  He states his belief that pt is 'depressed' because he doesn't understand how pt can 'just not be hungry.'  Dad verbalizes recent information learned at a cancer-related meeting that pt needs to be eating 4-5 meals/day and is frustrated pt is not currently able to do so.  Validated father's concerns that pt is not eating adequately, but provided education on process of treatment and side effects.  Provided pt with juice.  Informed pt that he was likely not exceeding 50% of his kcal needs at this time and encouraged intake.  Reviewed menu with pt and supplements available.  Does not like Boost anymore.  Meds: Scheduled Meds:   . Boost Plus  237 mL Oral TID WC  . enoxaparin (LOVENOX) injection  120 mg Subcutaneous Q24H  . fentaNYL  12.5 mcg Transdermal Q72H  . fluconazole  100 mg Oral Daily  . hydrocortisone sodium succinate  100 mg Intravenous Q12H  . pantoprazole  40 mg Oral Q1200  . piperacillin-tazobactam (ZOSYN)  IV  3.375 g Intravenous Q8H  . potassium chloride  40 mEq Oral BID  . vancomycin  1,250 mg Intravenous Q12H   Continuous Infusions:   . sodium chloride 10 mL/hr at 02/04/11 0700   PRN Meds:.acetaminophen, albuterol, loperamide, morphine, oxyCODONE, prochlorperazine, prochlorperazine  Labs:  CMP     Component Value Date/Time   NA 137 02/03/2011 0415   K 4.3 02/03/2011 0415   CL 106 02/03/2011 0415   CO2 24 02/03/2011 0415   GLUCOSE 99 02/03/2011 0415   BUN 27* 02/03/2011 0415   CREATININE 0.85 02/03/2011 0415   CALCIUM 8.4 02/03/2011 0415   PROT 5.9* 01/26/2011 1616   ALBUMIN 2.0* 01/30/2011 0936   AST 14 01/26/2011 1616   ALT 12 01/26/2011 1616   ALKPHOS 68 01/26/2011 1616   BILITOT 1.3* 01/26/2011  1616   GFRNONAA >90 02/03/2011 0415   GFRAA >90 02/03/2011 0415     Intake/Output Summary (Last 24 hours) at 02/04/11 1539 Last data filed at 02/04/11 1500  Gross per 24 hour  Intake   1620 ml  Output   1450 ml  Net    170 ml    Weight Status:   Admission wt: 188 lbs Current wt: 180 lbs, wt has overall been variable since admission.  Nutrition Dx:   Inadequate oral intake, ongoing  Intervention:   1.  Modify diet; recommend liberalizing diet to Regular as pt is refusing meals because they are bland. 2.  General healthful diet, encouraged intake based on pt's current preferences.  Will order meals for pt to limit refused meals. 3.  If pt unable to improve intake, may benefit from an appetite stimulant such as Remeron.  Megace may not be appropriate for pt at this time due to DVT.   Monitor:   1.  Food/Beverage; improvement in intake to prevent further wt loss, not met. Continue.   Hoyt Koch Pager #:  [phone]

## 2011-02-04 NOTE — Progress Notes (Signed)
Dennis [name] [id number] is a 60 y.o. male,  Outpatient Primary MD for the patient is [name], [name], MD, MD  Chief Complaint  Patient presents with  . Nausea  . Shortness of Breath        Subjective:   Still with cough but states non productive, breathing better, denies SOB. No chest pain. Chart reviewed. NO gross bleeding. Objective:   Filed Vitals:   02/03/11 1400 02/03/11 2256 02/04/11 0556 02/04/11 1315  BP: 124/78 151/78 135/73 131/77  Pulse: 54 50 50 67  Temp: 97.5 F (36.4 C) 98.4 F (36.9 C) 98.3 F (36.8 C) 97.2 F (36.2 C)  TempSrc: Oral Oral Oral Oral  Resp: 18 16 20 20   Height:      Weight:      SpO2: 90% 93% 93% 94%    Wt Readings from Last 3 Encounters:  02/02/11 81.647 kg (180 lb)  01/17/11 85.367 kg (188 lb 3.2 oz)  01/10/11 81.965 kg (180 lb 11.2 oz)     Intake/Output Summary (Last 24 hours) at 02/04/11 1826 Last data filed at 02/04/11 1700  Gross per 24 hour  Intake   1700 ml  Output   1600 ml  Net    100 ml    Exam General: Alert and oriented x3, no apparent distress, looks older than stated age HEENT: Normocephalic, atraumatic, mucous membranes are moist Cardiovascular: Regular rate and rhythm, S1-S2 Lungs: Decreased breath sounds at the bases,L. Lower lobe with coarse crackles  Abdomen: Soft, nontender, nondistended, positive bowel sounds Extremities: RLE with +1 edema  Data Review  CBC  Lab 02/04/11 0449 02/03/11 0415 02/02/11 0318 02/01/11 0215 01/31/11 0315  WBC 29.3* 22.1* 14.3* 4.7 0.8*  HGB 10.5* 10.4* 10.4* 11.4* 9.8*  HCT 29.5* 29.6* 29.6* 31.8* 27.0*  PLT 112* 63* 41* 49* 33*  MCV 88.3 87.6 86.0 85.9 83.6  MCH 31.4 30.8 30.2 30.8 30.3  MCHC 35.6 35.1 35.1 35.8 36.3*  RDW 14.3 14.0 13.6 13.4 12.9  LYMPHSABS 5.3* 2.0 1.7 0.4* 0.0*  MONOABS 0.9 2.7* 1.6* 0.8 0.0*  EOSABS 0.0 0.0 0.0 0.0 0.0  BASOSABS 0.0 0.0 0.0 0.0 0.0  BANDABS -- -- -- -- --    Chemistries   Lab 02/04/11 0449 02/03/11 0415  02/02/11 0318 02/01/11 0215 01/31/11 0315 01/30/11 1435  NA -- 137 134* 132* 128* 126*  K -- 4.3 4.2 4.3 3.9 3.3*  CL -- 106 104 101 98 95*  CO2 -- 24 23 24 22 22   GLUCOSE -- 99 99 119* 129* 152*  BUN -- 27* 27* 20 12 11   CREATININE -- 0.85 0.80 0.75 0.74 0.76  CALCIUM -- 8.4 8.7 9.0 8.2* 8.0*  MG 2.0 2.1 2.0 2.2 2.2 --  AST -- -- -- -- -- --  ALT -- -- -- -- -- --  ALKPHOS -- -- -- -- -- --  BILITOT -- -- -- -- -- --   ----------------- Scheduled Meds:    . Boost Plus  237 mL Oral TID WC  . enoxaparin (LOVENOX) injection  120 mg Subcutaneous Q24H  . fentaNYL  12.5 mcg Transdermal Q72H  . fluconazole  100 mg Oral Daily  . hydrocortisone sodium succinate  25 mg Intravenous Q12H  . pantoprazole  40 mg Oral Q1200  . piperacillin-tazobactam (ZOSYN)  IV  3.375 g Intravenous Q8H  . potassium chloride  40 mEq Oral BID  . vancomycin  1,250 mg Intravenous Q12H  . DISCONTD: hydrocortisone sodium succinate  100 mg Intravenous Q12H  Continuous Infusions:    . sodium chloride 10 mL/hr at 02/04/11 0700   PRN Meds:.acetaminophen, albuterol, loperamide, morphine, oxyCODONE, prochlorperazine, prochlorperazine  Assessment & Plan   1.Nosocomial PNA - Neutropenic due to Chemo for Seminoma complicated by SIRs and mild fluid overload-  Continue ABX  + Diflucan for now, afebrile,  ABX D/W Dr Orvan Falconer 01-27-11, cultures remain negative, HaemOnc following. Neutropenia looks to have resolved stable, responded well to Lasix. , stable EKG and Trop,  -ve Ct angio for PE. Echocardiogram done this morning shows normal systolic and diastolic function.  on stress dose Hydrocortisone which is the likely cause for worsening leukocytosis, his BP is remaining stable - will taper off hydrocortisone and follow  2. Leukocytosis-neutropenia has clearly resolved and continuing increase in WBC likely due to high dose steroids and may be the neulasta a few weeks ago still contributing. No evidence of new infection  as his oxygenation is stable, chest x-ray from 12/26 shows improving airway disease, he is on Zosyn and vancomycin and his urine looks unremarkable. He has some loose stools but no significant signs of infectious diarrhea and C. difficile PCR is negative.  - will follow BP as steroids being tapered and further treat accordingly  3. ARF (acute renal failure) with hyponatremia  - prerenal in etiology  - resolved with IVF.   4. Right leg DVT in the setting of Thrombocytopenia - continue Lovenox per Heme Onc , monitor plts. CT negative for pulmonary embolus. He'll go home on Lovenox - plts improved to 112, follow no gross bleeding 5.Hyponatremia - resolved with hydration 6.Hypokalemia - resolved.   7.Chr Diarrhea - negative for C diff. PRN imodium.    DVT Prophylaxis  Lovenox    See all Orders from today for further details  Prognosis improved. Updated patient's father today-12/27  Kela Millin M.D  02/04/2011, 6:26 PM  [facility]  [phone]

## 2011-02-05 LAB — DIFFERENTIAL
Basophils Absolute: 0 10*3/uL (ref 0.0–0.1)
Lymphs Abs: 3.4 10*3/uL (ref 0.7–4.0)
Monocytes Absolute: 1.5 10*3/uL — ABNORMAL HIGH (ref 0.1–1.0)
Monocytes Relative: 4 % (ref 3–12)
Neutro Abs: 32.6 10*3/uL — ABNORMAL HIGH (ref 1.7–7.7)

## 2011-02-05 LAB — BASIC METABOLIC PANEL
BUN: 18 mg/dL (ref 6–23)
CO2: 25 mEq/L (ref 19–32)
CO2: 24 mEq/L (ref 19–32)
Calcium: 8.4 mg/dL (ref 8.4–10.5)
Chloride: 103 mEq/L (ref 96–112)
Creatinine, Ser: 0.88 mg/dL (ref 0.50–1.35)
Creatinine, Ser: 0.82 mg/dL (ref 0.50–1.35)
Glucose, Bld: 91 mg/dL (ref 70–99)

## 2011-02-05 LAB — CBC
Hemoglobin: 10.8 g/dL — ABNORMAL LOW (ref 13.0–17.0)
MCH: 30.4 pg (ref 26.0–34.0)
MCV: 88.2 fL (ref 78.0–100.0)
Platelets: 209 10*3/uL (ref 150–400)
RBC: 3.55 MIL/uL — ABNORMAL LOW (ref 4.22–5.81)

## 2011-02-05 MED ORDER — PREDNISONE 20 MG PO TABS
40.0000 mg | ORAL_TABLET | Freq: Every day | ORAL | Status: DC
  Administered 2011-02-06 – 2011-02-07 (×2): 40 mg via ORAL
  Filled 2011-02-05 (×3): qty 2

## 2011-02-05 MED ORDER — POTASSIUM CHLORIDE CRYS ER 20 MEQ PO TBCR
40.0000 meq | EXTENDED_RELEASE_TABLET | ORAL | Status: AC
  Administered 2011-02-05: 40 meq via ORAL
  Filled 2011-02-05: qty 2

## 2011-02-05 NOTE — Progress Notes (Signed)
Dennis [name] [id number] is a 60 y.o. male,  Outpatient Primary MD for the patient is [name], [name], MD, MD  Chief Complaint  Patient presents with  . Nausea  . Shortness of Breath        Subjective:   States he feels better today, breathing better. No chest pain. Ambulating in room without assistance today. NO gross bleeding. Objective:   Filed Vitals:   02/04/11 2100 02/05/11 0551 02/05/11 1506 02/05/11 1950  BP:  149/84 157/81 131/81  Pulse:  62 58 67  Temp:  97.3 F (36.3 C) 97.5 F (36.4 C) 97.9 F (36.6 C)  TempSrc:  Oral Oral Oral  Resp:  20 19 18   Height:      Weight:      SpO2: 94% 90% 91% 94%    Wt Readings from Last 3 Encounters:  02/02/11 81.647 kg (180 lb)  01/17/11 85.367 kg (188 lb 3.2 oz)  01/10/11 81.965 kg (180 lb 11.2 oz)     Intake/Output Summary (Last 24 hours) at 02/05/11 2045 Last data filed at 02/05/11 1850  Gross per 24 hour  Intake    830 ml  Output   2650 ml  Net  -1820 ml    Exam General: Alert and oriented x3, no apparent distress, looks older than stated age HEENT: Normocephalic, atraumatic, mucous membranes are moist Cardiovascular: Regular rate and rhythm, S1-S2 Lungs: Decreased breath sounds at the bases, no wheezes. Abdomen: Soft, nontender, nondistended, positive bowel sounds Extremities: RLE with +1 edema  Data Review  CBC  Lab 02/05/11 0125 02/04/11 0449 02/03/11 0415 02/02/11 0318 02/01/11 0215  WBC 37.5* 29.3* 22.1* 14.3* 4.7  HGB 10.8* 10.5* 10.4* 10.4* 11.4*  HCT 31.3* 29.5* 29.6* 29.6* 31.8*  PLT 209 112* 63* 41* 49*  MCV 88.2 88.3 87.6 86.0 85.9  MCH 30.4 31.4 30.8 30.2 30.8  MCHC 34.5 35.6 35.1 35.1 35.8  RDW 14.2 14.3 14.0 13.6 13.4  LYMPHSABS 3.4 5.3* 2.0 1.7 0.4*  MONOABS 1.5* 0.9 2.7* 1.6* 0.8  EOSABS 0.0 0.0 0.0 0.0 0.0  BASOSABS 0.0 0.0 0.0 0.0 0.0  BANDABS -- -- -- -- --    Chemistries   Lab 02/05/11 0520 02/05/11 0125 02/04/11 0449 02/03/11 0415 02/02/11 0318 02/01/11  0215  NA 139 136 -- 137 134* 132*  K 3.3* 3.3* -- 4.3 4.2 4.3  CL 104 103 -- 106 104 101  CO2 24 25 -- 24 23 24   GLUCOSE 91 95 -- 99 99 119*  BUN 16 18 -- 27* 27* 20  CREATININE 0.82 0.88 -- 0.85 0.80 0.75  CALCIUM 8.4 8.4 -- 8.4 8.7 9.0  MG -- 2.0 2.0 2.1 2.0 2.2  AST -- -- -- -- -- --  ALT -- -- -- -- -- --  ALKPHOS -- -- -- -- -- --  BILITOT -- -- -- -- -- --   ----------------- Scheduled Meds:    . Boost Plus  237 mL Oral TID WC  . enoxaparin (LOVENOX) injection  120 mg Subcutaneous Q24H  . fentaNYL  12.5 mcg Transdermal Q72H  . fluconazole  100 mg Oral Daily  . hydrocortisone sodium succinate  25 mg Intravenous Q12H  . pantoprazole  40 mg Oral Q1200  . piperacillin-tazobactam (ZOSYN)  IV  3.375 g Intravenous Q8H  . potassium chloride  40 mEq Oral BID  . vancomycin  1,250 mg Intravenous Q12H   Continuous Infusions:    . 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 and further manage as appropriate. Continue ABX  + Diflucan for now, afebrile,  ABX D/W Dr Orvan Falconer 01-27-11, cultures remain negative, stable EKG and Trop,  -ve Ct angio for PE. Echocardiogram done this morning shows normal systolic and diastolic function.  on stress dose Hydrocortisone which is the likely cause for worsening leukocytosis, his BP is remaining stable - continue to taper steroids, will change to by mouth prednisone in the a.m. the 2. Leukocytosis-follow recheck in the a.m. with steroids being tapered. neutropenia has clearly resolved and continuing increase in WBC likely due to high dose steroids and may be the neulasta a few weeks ago still contributing. No  evidence of new infection as his oxygenation is stable, chest x-ray from 12/26 shows improving airway disease, he is on Zosyn and vancomycin and his urine looks unremarkable. He has some loose stools but no significant signs of infectious diarrhea and C. difficile PCR is negative.   -3. ARF (acute renal failure) with hyponatremia  - prerenal in etiology  - resolved with IVF.  4. Right leg DVT in the setting of Thrombocytopenia - continue Lovenox per Heme Onc , monitor plts. CT negative for pulmonary embolus. He'll go home on Lovenox - plts improved to 112, follow no gross bleeding 5.Hyponatremia - resolved with hydration  6.Hypokalemia - replace potassium   7.Chr Diarrhea - negative for C diff. PRN imodium.    DVT Prophylaxis  Lovenox     Kela Millin M.D  02/05/2011, 8:45 PM  [facility]  [phone]

## 2011-02-05 NOTE — Progress Notes (Signed)
ANTIBIOTIC CONSULT NOTE - FOLLOW UP  Pharmacy Consult for Vancomycin/Zosyn Indication: HCAP  No Known Allergies  Patient Measurements: Height: 6' (182.9 cm) Weight: 180 lb (81.647 kg) IBW/kg (Calculated) : 77.6    Vital Signs: Temp: 97.3 F (36.3 C) (12/29 0551) Temp src: Oral (12/29 0551) BP: 149/84 mmHg (12/29 0551) Pulse Rate: 62  (12/29 0551) Intake/Output from previous day: 12/28 0701 - 12/29 0700 In: 1750 [P.O.:1280; I.V.:170; IV Piggyback:300] Out: 1525 [Urine:1525] Intake/Output from this shift: Total I/O In: 300 [IV Piggyback:300] Out: 900 [Urine:900]  Labs:  [facility] 02/05/11 0520 02/05/11 0125 02/04/11 0449 02/03/11 0415  WBC -- 37.5* 29.3* 22.1*  HGB -- 10.8* 10.5* 10.4*  PLT -- 209 112* 63*  LABCREA -- -- -- --  CREATININE 0.82 0.88 -- 0.85   Estimated Creatinine Clearance: 105.1 ml/min (by C-G formula based on Cr of 0.82). No results found for this basename: VANCOTROUGH:2,VANCOPEAK:2,VANCORANDOM:2,GENTTROUGH:2,GENTPEAK:2,GENTRANDOM:2,TOBRATROUGH:2,TOBRAPEAK:2,TOBRARND:2,AMIKACINPEAK:2,AMIKACINTROU:2,AMIKACIN:2, in the last 72 hours   Microbiology: Recent Results (from the past 720 hour(s))  TECHNOLOGIST REVIEW     Status: Normal   Collection Time   01/26/11  1:59 PM      Component Value Range Status Comment   Technologist Review Occas Lymph, Rare Seg   Final   CULTURE, BLOOD (ROUTINE X 2)     Status: Normal   Collection Time   01/26/11  5:10 PM      Component Value Range Status Comment   Specimen Description BLOOD   Final    Special Requests BOTTLES DRAWN AEROBIC AND ANAEROBIC   Final    Setup Time [id number]   Final    Culture NO GROWTH 5 DAYS   Final    Report Status 02/02/2011 FINAL   Final   CULTURE, BLOOD (ROUTINE X 2)     Status: Normal   Collection Time   01/26/11  5:19 PM      Component Value Range Status Comment   Specimen Description BLOOD RIGHT ARM   Final    Special Requests BOTTLES DRAWN AEROBIC AND ANAEROBIC 5CC   Final    Setup Time [id number]   Final    Culture NO GROWTH 5 DAYS   Final    Report Status 02/02/2011 FINAL   Final   URINE CULTURE     Status: Normal   Collection Time   01/26/11  7:21 PM      Component Value Range Status Comment   Specimen Description URINE, CLEAN CATCH   Final    Special Requests NONE   Final    Setup Time [id number]   Final    Colony Count NO GROWTH   Final    Culture NO GROWTH   Final    Report Status 01/27/2011 FINAL   Final   CULTURE, BLOOD (ROUTINE X 2)     Status: Normal   Collection Time   01/26/11  9:40 PM      Component Value Range Status Comment   Specimen Description BLOOD RIGHT HAND   Final    Special Requests BOTTLES DRAWN AEROBIC AND ANAEROBIC 3CC   Final    Setup Time [id number]   Final    Culture NO GROWTH 5 DAYS   Final    Report Status 02/02/2011 FINAL   Final   CULTURE, BLOOD (ROUTINE X 2)     Status: Normal   Collection Time   01/26/11  9:45 PM      Component Value Range Status Comment   Specimen Description BLOOD RIGHT HAND   Final  Special Requests BOTTLES DRAWN AEROBIC ONLY 3CC   Final    Setup Time [id number]   Final    Culture NO GROWTH 5 DAYS   Final    Report Status 02/02/2011 FINAL   Final   MRSA PCR SCREENING     Status: Normal   Collection Time   01/27/11  9:39 PM      Component Value Range Status Comment   MRSA by PCR NEGATIVE  NEGATIVE  Final   CLOSTRIDIUM DIFFICILE BY PCR     Status: Normal   Collection Time   01/28/11  3:50 AM      Component Value Range Status Comment   C difficile by pcr NEGATIVE  NEGATIVE  Final   PATHOLOGIST SMEAR REVIEW     Status: Normal   Collection Time   01/30/11  3:18 AM      Component Value Range Status Comment   Tech Review Reviewed By Havery Moros, M.D.   Final   PATHOLOGIST SMEAR REVIEW     Status: Normal   Collection Time   02/03/11  4:15 AM      Component Value Range Status Comment   Tech Review Reviewed By Havery Moros, M.D.   Final     Anti-infectives     Start      Dose/Rate Route Frequency Ordered Stop   01/31/11 1000   vancomycin (VANCOCIN) 1,250 mg in sodium chloride 0.9 % 250 mL IVPB        1,250 mg 166.7 mL/hr over 90 Minutes Intravenous Every 12 hours 01/30/11 2250     01/30/11 1100   fluconazole (DIFLUCAN) tablet 100 mg        100 mg Oral Daily 01/30/11 1010     01/29/11 1400   vancomycin (VANCOCIN) IVPB 1000 mg/200 mL premix  Status:  Discontinued        1,000 mg 200 mL/hr over 60 Minutes Intravenous Every 8 hours 01/29/11 1200 01/30/11 2249   01/29/11 1200   fluconazole (DIFLUCAN) IVPB 200 mg  Status:  Discontinued        200 mg 100 mL/hr over 60 Minutes Intravenous Every 24 hours 01/29/11 1009 01/30/11 1010   01/27/11 0815   ciprofloxacin (CIPRO) IVPB 400 mg  Status:  Discontinued        400 mg 200 mL/hr over 60 Minutes Intravenous Every 12 hours 01/27/11 0802 01/27/11 0956   01/27/11 0600   vancomycin (VANCOCIN) IVPB 1000 mg/200 mL premix  Status:  Discontinued        1,000 mg 200 mL/hr over 60 Minutes Intravenous Every 12 hours 01/26/11 2119 01/29/11 1159   01/27/11 0200  piperacillin-tazobactam (ZOSYN) IVPB 3.375 g       3.375 g 12.5 mL/hr over 240 Minutes Intravenous Every 8 hours 01/26/11 2119     01/26/11 2200   piperacillin-tazobactam (ZOSYN) IVPB 2.25 g  Status:  Discontinued        2.25 g 100 mL/hr over 30 Minutes Intravenous 3 times per day 01/26/11 2104 01/26/11 2118   01/26/11 2115   vancomycin (VANCOCIN) IVPB 1000 mg/200 mL premix  Status:  Discontinued        1,000 mg 200 mL/hr over 60 Minutes Intravenous Every 12 hours 01/26/11 2104 01/26/11 2108   01/26/11 1715   vancomycin (VANCOCIN) IVPB 1000 mg/200 mL premix        1,000 mg 200 mL/hr over 60 Minutes Intravenous To Emergency Dept 01/26/11 1708 01/26/11 1833   01/26/11 1715  piperacillin-tazobactam (ZOSYN)  IVPB 4.5 g       4.5 g 200 mL/hr over 30 Minutes Intravenous To Emergency Dept 01/26/11 1708 01/26/11 1919          Assessment  60 yo M on Day #  12 Vanc/zosyn for HCAP and neutropenic fever (recent chemo). Day # 9 Diflucan for thrush.  On solu-cortef.  Pt. Remains Afebrile, clx negative. Renal fnx stable.   Per ONC, ok to d/c abx when ANC >/= 1 if remains AF and clx remain negative.  WBC now trending down, leukocytosis was likely from high dose steroids which are not being tapered.   Will continue IV abx for now and wait for MD to re-evaluate.  Goal of Therapy:  Vancomycin trough 15-20  Plan:  Continue current antibiotic regimen.  Anticipate d/c soon as pt. Improving and leukocytosis resolving and likely secondary to high dose steroids.   Clema Skousen, Loma Messing PharmD 3:09 PM 02/05/2011

## 2011-02-05 NOTE — Progress Notes (Signed)
ANTICOAGULATION CONSULT NOTE - Follow Up Consult  Pharmacy Consult for Lovenox Indication: pulmonary embolus  No Known Allergies  Patient Measurements: Height: 6' (182.9 cm) Weight: 180 lb (81.647 kg) IBW/kg (Calculated) : 77.6  Adjusted Body Weight:   Vital Signs: Temp: 97.8 F (36.6 C) (12/28 2044) Temp src: Oral (12/28 2044) BP: 152/81 mmHg (12/28 2044) Pulse Rate: 56  (12/28 2044)  Labs:  [facility] 02/05/11 0125 02/04/11 0449 02/03/11 0415 02/02/11 0318  HGB 10.8* 10.5* -- --  HCT 31.3* 29.5* 29.6* --  PLT 209 112* 63* --  APTT -- -- -- --  LABPROT -- -- -- --  INR -- -- -- --  HEPARINUNFRC -- -- -- --  CREATININE 0.88 -- 0.85 0.80  CKTOTAL -- -- -- --  CKMB -- -- -- --  TROPONINI -- -- -- --   Estimated Creatinine Clearance: 98 ml/min (by C-G formula based on Cr of 0.88).     Assessment: Patient with level at goal (1.21)  Goal of Therapy:  LMWH level 4hr post dose 1-2   Plan:  Continue with current dose.  Darlina Guys, Jacquenette Shone Crowford 02/05/2011,2:30 AM

## 2011-02-06 LAB — DIFFERENTIAL
Basophils Relative: 0 % (ref 0–1)
Eosinophils Absolute: 0 10*3/uL (ref 0.0–0.7)
Lymphs Abs: 6 10*3/uL — ABNORMAL HIGH (ref 0.7–4.0)
Monocytes Absolute: 1.1 10*3/uL — ABNORMAL HIGH (ref 0.1–1.0)
Neutro Abs: 28.1 10*3/uL — ABNORMAL HIGH (ref 1.7–7.7)

## 2011-02-06 LAB — CBC
HCT: 30.5 % — ABNORMAL LOW (ref 39.0–52.0)
Hemoglobin: 10.8 g/dL — ABNORMAL LOW (ref 13.0–17.0)
MCH: 31.5 pg (ref 26.0–34.0)
MCHC: 35.4 g/dL (ref 30.0–36.0)

## 2011-02-06 LAB — BASIC METABOLIC PANEL
Calcium: 8.4 mg/dL (ref 8.4–10.5)
GFR calc Af Amer: >90 mL/min (ref >90)
GFR calc non Af Amer: >90 mL/min (ref >90)
Glucose, Bld: 97 mg/dL (ref 70–99)
Potassium: 3.6 mEq/L (ref 3.5–5.1)
Sodium: 135 mEq/L (ref 135–145)

## 2011-02-06 MED ORDER — POTASSIUM CHLORIDE CRYS ER 20 MEQ PO TBCR
40.0000 meq | EXTENDED_RELEASE_TABLET | Freq: Every day | ORAL | Status: DC
  Administered 2011-02-07: 40 meq via ORAL
  Filled 2011-02-06 (×2): qty 2

## 2011-02-06 NOTE — Progress Notes (Signed)
Dennis [name] [id number] is a 60 y.o. male,  Outpatient Primary MD for the patient is [name], [name], MD, MD  Chief Complaint  Patient presents with  . Nausea  . Shortness of Breath        Subjective:   Denies any new complaints are, was up ambulating in hallways today. States breathing better at. No gross bleeding. Tolerating by mouth well. Objective:   Filed Vitals:   02/05/11 0551 02/05/11 1506 02/05/11 1950 02/06/11 0540  BP: 149/84 157/81 131/81 133/76  Pulse: 62 58 67 67  Temp: 97.3 F (36.3 C) 97.5 F (36.4 C) 97.9 F (36.6 C) 98.2 F (36.8 C)  TempSrc: Oral Oral Oral Oral  Resp: 20 19 18 18   Height:      Weight:      SpO2: 90% 91% 94% 93%    Wt Readings from Last 3 Encounters:  02/02/11 81.647 kg (180 lb)  01/17/11 85.367 kg (188 lb 3.2 oz)  01/10/11 81.965 kg (180 lb 11.2 oz)     Intake/Output Summary (Last 24 hours) at 02/06/11 1047 Last data filed at 02/06/11 0540  Gross per 24 hour  Intake    830 ml  Output   2350 ml  Net  -1520 ml    Exam General: Alert and oriented x3, no apparent distress, looks older than stated age HEENT: Normocephalic, atraumatic, mucous membranes are moist Cardiovascular: Regular rate and rhythm, S1-S2 Lungs: Improved aeration, no wheezes. Abdomen: Soft, nontender, nondistended, positive bowel sounds Extremities: RLE with +1 edema, no cyanosis.  Data Review  CBC  Lab 02/06/11 0350 02/05/11 0125 02/04/11 0449 02/03/11 0415 02/02/11 0318  WBC 35.2* 37.5* 29.3* 22.1* 14.3*  HGB 10.8* 10.8* 10.5* 10.4* 10.4*  HCT 30.5* 31.3* 29.5* 29.6* 29.6*  PLT 287 209 112* 63* 41*  MCV 88.9 88.2 88.3 87.6 86.0  MCH 31.5 30.4 31.4 30.8 30.2  MCHC 35.4 34.5 35.6 35.1 35.1  RDW 14.5 14.2 14.3 14.0 13.6  LYMPHSABS 6.0* 3.4 5.3* 2.0 1.7  MONOABS 1.1* 1.5* 0.9 2.7* 1.6*  EOSABS 0.0 0.0 0.0 0.0 0.0  BASOSABS 0.0 0.0 0.0 0.0 0.0  BANDABS -- -- -- -- --    Chemistries   Lab 02/06/11 0350 02/05/11 0520  02/05/11 0125 02/04/11 0449 02/03/11 0415 02/02/11 0318  NA 135 139 136 -- 137 134*  K 3.6 3.3* 3.3* -- 4.3 4.2  CL 102 104 103 -- 106 104  CO2 25 24 25  -- 24 23  GLUCOSE 97 91 95 -- 99 99  BUN 13 16 18  -- 27* 27*  CREATININE 0.83 0.82 0.88 -- 0.85 0.80  CALCIUM 8.4 8.4 8.4 -- 8.4 8.7  MG 1.9 -- 2.0 2.0 2.1 2.0  AST -- -- -- -- -- --  ALT -- -- -- -- -- --  ALKPHOS -- -- -- -- -- --  BILITOT -- -- -- -- -- --   ----------------- Scheduled Meds:    . Boost Plus  237 mL Oral TID WC  . enoxaparin (LOVENOX) injection  120 mg Subcutaneous Q24H  . fentaNYL  12.5 mcg Transdermal Q72H  . fluconazole  100 mg Oral Daily  . hydrocortisone sodium succinate  25 mg Intravenous Q12H  . pantoprazole  40 mg Oral Q1200  . piperacillin-tazobactam (ZOSYN)  IV  3.375 g Intravenous Q8H  . potassium chloride  40 mEq Oral Q4H  . potassium chloride  40 mEq Oral Daily  . predniSONE  40 mg Oral Q breakfast  . vancomycin  1,250  mg Intravenous Q12H  . DISCONTD: potassium chloride  40 mEq Oral BID   Continuous Infusions:    . 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 Echocardiogram done this morning shows normal systolic and diastolic function.  on stress dose Hydrocortisone which is the likely cause for worsening leukocytosis, his BP is remaining stable so far -monitor BP and if remaining stable today on PO steroids and WBC still trending down, will plan d/c soon  2. Leukocytosis -beginning to trend down today with a tapering of the steroids and  patient remaining afebrile and hemodynamically stable, as above will DC antibiotics and follow. neutropenia has clearly resolved and continuing increase in WBC likely due to high dose steroids and may be the neulasta a few weeks ago still contributing. No evidence of new infection as his oxygenation is stable, chest x-ray from 12/26 shows improving airway disease, he is on Zosyn and vancomycin and his urine looks unremarkable. He had some loose stools but no significant signs of infectious diarrhea and C. difficile PCR is negative.   -3. ARF (acute renal failure) with hyponatremia  - prerenal in etiology  - resolved with IVF.  4. Right leg DVT in the setting of Thrombocytopenia - continue Lovenox per Heme Onc , monitor plts. CT negative for pulmonary embolus. He'll go home on Lovenox - plts improved to 112, follow no gross bleeding 5.Hyponatremia - resolved with hydration  6.Hypokalemia - resolved   7.Chr Diarrhea - negative for C diff. PRN imodium.    DVT Prophylaxis  Lovenox     Kela Millin M.D  02/06/2011, 10:47 AM  [facility] Office  [phone]

## 2011-02-07 ENCOUNTER — Other Ambulatory Visit: Payer: Self-pay | Admitting: Oncology

## 2011-02-07 ENCOUNTER — Other Ambulatory Visit: Payer: 59 | Admitting: Lab

## 2011-02-07 ENCOUNTER — Other Ambulatory Visit: Payer: Self-pay

## 2011-02-07 DIAGNOSIS — D61818 Other pancytopenia: Secondary | ICD-10-CM

## 2011-02-07 DIAGNOSIS — D709 Neutropenia, unspecified: Secondary | ICD-10-CM

## 2011-02-07 DIAGNOSIS — C6290 Malignant neoplasm of unspecified testis, unspecified whether descended or undescended: Secondary | ICD-10-CM

## 2011-02-07 DIAGNOSIS — J189 Pneumonia, unspecified organism: Secondary | ICD-10-CM

## 2011-02-07 DIAGNOSIS — I82409 Acute embolism and thrombosis of unspecified deep veins of unspecified lower extremity: Secondary | ICD-10-CM

## 2011-02-07 LAB — DIFFERENTIAL
Band Neutrophils: 0 % (ref 0–10)
Blasts: 0 %
Lymphocytes Relative: 5 % — ABNORMAL LOW (ref 12–46)
Lymphs Abs: 1.7 10*3/uL (ref 0.7–4.0)
Metamyelocytes Relative: 0 %
Monocytes Relative: 2 % — ABNORMAL LOW (ref 3–12)
Promyelocytes Absolute: 0 %
nRBC: 0 /100 WBC

## 2011-02-07 LAB — CBC
HCT: 30.5 % — ABNORMAL LOW (ref 39.0–52.0)
Hemoglobin: 10.5 g/dL — ABNORMAL LOW (ref 13.0–17.0)
RBC: 3.4 MIL/uL — ABNORMAL LOW (ref 4.22–5.81)

## 2011-02-07 LAB — BASIC METABOLIC PANEL
CO2: 26 mEq/L (ref 19–32)
Chloride: 102 mEq/L (ref 96–112)
Glucose, Bld: 82 mg/dL (ref 70–99)
Sodium: 135 mEq/L (ref 135–145)

## 2011-02-07 LAB — MAGNESIUM: Magnesium: 1.9 mg/dL (ref 1.5–2.5)

## 2011-02-07 MED ORDER — PREDNISONE 20 MG PO TABS: 20.0000 mg | ORAL_TABLET | Freq: Every day | ORAL | Status: AC

## 2011-02-07 MED ORDER — POTASSIUM CHLORIDE CRYS ER 20 MEQ PO TBCR: 20.0000 meq | EXTENDED_RELEASE_TABLET | Freq: Every day | ORAL | Status: DC

## 2011-02-07 MED ORDER — PANTOPRAZOLE SODIUM 40 MG PO TBEC: 40.0000 mg | DELAYED_RELEASE_TABLET | Freq: Every day | ORAL | Status: DC

## 2011-02-07 MED ORDER — OXYCODONE HCL 5 MG PO TABS: 5.0000 mg | ORAL_TABLET | ORAL | Status: AC | PRN

## 2011-02-07 MED ORDER — FLUCONAZOLE 100 MG PO TABS: 100.0000 mg | ORAL_TABLET | Freq: Every day | ORAL | Status: AC

## 2011-02-07 MED ORDER — FENTANYL 12 MCG/HR TD PT72: 1.0000 | MEDICATED_PATCH | TRANSDERMAL | Status: AC

## 2011-02-07 NOTE — Discharge Summary (Signed)
Name: Dennis [name]  MRN: [id number]  DOB: 08-06-1950 60 y.o.  Date of Admission: 01/26/2011 3:08 PM  Date of Discharge: 02/07/2011  Attending Physician: Kela Millin  Discharge Diagnosis:  Principal Problem:  *Neutropenic fever  Active Problems:  Seminoma  Right leg DVT  ARF (acute renal failure)  PNA (pneumonia)  Thrombocytopenia  Thrush   Discharge Medications:    Current Discharge Medication List       START taking these medications     Details    fentaNYL (DURAGESIC - DOSED MCG/HR) 12 MCG/HR  Place 1 patch (12.5 mcg total) onto the skin every 3 (three) days.  Qty: 10 patch, Refills: 0       fluconazole (DIFLUCAN) 100 MG tablet  Take 1 tablet (100 mg total) by mouth daily.  Qty: 7 tablet, Refills: 0     Associated Diagnoses: Neutropenic fever       oxyCODONE (OXY IR/ROXICODONE) 5 MG immediate release tablet  Take 1 tablet (5 mg total) by mouth every 4 (four) hours as needed.  Qty: 30 tablet, Refills: 0       pantoprazole (PROTONIX) 40 MG tablet  Take 1 tablet (40 mg total) by mouth daily at 12 noon.  Qty: 30 tablet, Refills: 0       potassium chloride SA (K-DUR,KLOR-CON) 20 MEQ tablet  Take 1 tablet (20 mEq total) by mouth daily.  Qty: 30 tablet, Refills: 0       predniSONE (DELTASONE) 20 MG tablet  Take 1 tablet (20 mg total) by mouth daily.  Qty: 30 tablet, Refills: 0          CONTINUE these medications which have NOT CHANGED     Details    allopurinol (ZYLOPRIM) 300 MG tablet  Take 1 tablet (300 mg total) by mouth daily.  Qty: 30 tablet, Refills: 3     Associated Diagnoses: Testicular cancer       enoxaparin (LOVENOX) 120 MG/0.8ML SOLN  Inject 0.8 mLs (120 mg total) into the skin daily.  Qty: 12.6 mL, Refills: 3     Associated Diagnoses: DVT (deep venous thrombosis)       prochlorperazine (COMPAZINE) 10 MG tablet  Take 10 mg by mouth every 6 (six) hours as needed. nausea       prochlorperazine (COMPAZINE) 25 MG suppository  Place 25 mg rectally every  12 (twelve) hours as needed. Nausea          STOP taking these medications        HYDROcodone-acetaminophen (NORCO) 10-325 MG per tablet            Disposition and follow-up:  Dennis [name] was discharged from [facility] in improved /stablecondition.  Follow-up Appointments:    Discharge Orders     Future Appointments:  Provider:  Department:  Dept Phone:  Center:     02/14/2011 10:00 AM  Devonne Doughty Dory Larsen  [facility]  [phone]  None     02/14/2011 10:30 AM  Samul Dada, MD  [facility]  [phone]  None     02/14/2011 11:30 AM  [facility]  [facility]  [phone]  None     02/15/2011 9:30 AM  [facility]  [facility]  [phone]  None     02/16/2011 9:15 AM  [facility]  [facility]  [phone]  None     02/17/2011 10:15 AM  [facility]  [facility]  [phone]  None     02/18/2011 9:00 AM  [facility]  [facility]  [phone]  None     02/19/2011 11:45 AM  [facility]  [facility]  [phone]  None        Future Orders  Please Complete By  Expires     Ambulatory referral to Home Health       Comments:     Please evaluate Dennis [name] for admission to [facility].  Disciplines requested: Home health PT  Services to provide: Physical therapy  Physician to follow patient's care (the person listed here will be responsible for signing ongoing orders): Patient's primary care physician  Requested Start of Care Date:     Diet general       Increase activity slowly       Call MD for: temperature >100.4          Consultations: Treatment Team:  Samul Dada, MD  Procedures Performed:  Dg Chest 1 View  01/29/2011 *RADIOLOGY REPORT* Clinical Data: Pneumonia, follow-up CHEST - 1 VIEW Comparison: Portable chest x-ray of 01/28/2011 Findings: As noted on the prior chest x-ray there is slightly more opacity at the left lung base suspicious for pneumonia. The right lung base remains relatively well  aerated. No effusion is seen. Mediastinal contours are stable and heart size is stable. IMPRESSION: Persistent left basilar opacity most consistent with pneumonia. Original Report Authenticated By: Juline Patch, M.D.  Dg Chest 2 View  01/26/2011 *RADIOLOGY REPORT* Clinical Data: Shortness of breath CHEST - 2 VIEW Comparison: 12/10/2010 Findings: Cardiomediastinal silhouette is stable. Mild thoracic spine osteopenia. Hazy airspace disease right base suspicious for infiltrate. Trace left basilar atelectasis or infiltrate. No pulmonary edema. IMPRESSION: Hazy airspace disease right base suspicious for infiltrate. Trace left basilar atelectasis or infiltrate. No pulmonary edema. Original Report Authenticated By: Natasha Mead, M.D.  Ct Angio Chest W/cm &/or Wo Cm  01/30/2011 *RADIOLOGY REPORT* Clinical Data: Progressive shortness of breath, diaphoresis, history of DVT, metastatic seminoma, thrombocytopenia, pneumonia CT ANGIOGRAPHY CHEST WITH CONTRAST Technique: Multidetector CT imaging of the chest was performed using the standard protocol during bolus administration of intravenous contrast. Multiplanar CT image reconstructions including MIPs were obtained to evaluate the vascular anatomy. Contrast: 80 ml Omnipaque-300 Comparison: 01/30/2011, 01/26/2011 Findings: No significant central or proximal hilar pulmonary embolus or filling defect by CTA. Limited with some respiratory motion artifact. Minimal atherosclerotic calcifications. Thoracic aorta appears intact. No dissection or aneurysm. Normal heart size. Coronary calcifications noted. No pericardial effusion. Very small left pleural effusion identified. Included upper abdomen demonstrates no acute process. Lung windows demonstrate worsening patchy scattered airspace disease throughout both lungs diffusely, more pronounced in the lingula, both lower lobes, and the inferior right middle lobe. Degenerative changes of the spine. No acute osseous abnormality. Review of  the MIP images confirms the above findings. IMPRESSION: Negative for significant central or proximal hilar pulmonary embolus by CTA. Worsening bilateral patchy scattered airspace process, most pronounced in the left upper lobe, lingula, and both lower lobes, suspect multilobar pneumonia. New small left effusion. Original Report Authenticated By: Judie Petit. Ruel Favors, M.D.  Ct Angio Chest W/cm &/or Wo Cm  01/26/2011 *RADIOLOGY REPORT* Clinical Data: Shortness of breath, recent DVT, rule out pulmonary embolus CT ANGIOGRAPHY CHEST WITH CONTRAST Technique: Multidetector CT imaging of the chest was performed using the standard protocol during bolus administration of intravenous contrast. Multiplanar CT image reconstructions including MIPs were obtained to evaluate the vascular anatomy. Contrast: 90mL OMNIPAQUE IOHEXOL 300 MG/ML IV SOLN Comparison: 12/10/2010 Findings: Images of the thoracic inlet  are unremarkable. Central airways are patent. Mild atherosclerotic calcifications of thoracic aorta. No hilar or mediastinal adenopathy. Sagittal images of the spine shows mild degenerative changes thoracic spine. No destructive bony lesions are noted. Sagittal view of the sternum is unremarkable. No pulmonary embolus is noted. Images of the lung parenchyma shows no pulmonary edema. In axial image 71 there is small amount of consolidation in the right middle lobe suspicious for infiltrate. Small amount of triangular- shaped consolidation noted in the left base anteriorly with air bronchogram suspicious for atelectasis or infiltrate. Mild bronchial thickening noted in the right middle lobe. The previous retrocrural adenopathy has decreased in size. Measures 3.1 x 2 cm on the prior exam measures 4.5 x 3.1 cm. Heart size is within normal limits. Atherosclerotic calcifications of the coronary arteries are noted. Review of the MIP images confirms the above findings. IMPRESSION: 1. No pulmonary embolus. 2. There is a small amount of  consolidation with air bronchogram in the right middle lobe suspicious for infiltrate. Small amount of atelectasis or infiltrate noted in the left base anteriorly. 3. No hilar or mediastinal adenopathy. 4. Decrease in size of retrocrural adenopathy measures 3.1 x 2 cm. On prior exam measures 4.5 x 3.1 cm. 5. No pulmonary edema. Original Report Authenticated By: Natasha Mead, M.D.  Nm Pet Image Initial (pi) Skull Base To Thigh  01/18/2011 *RADIOLOGY REPORT* Clinical Data: Initial treatment strategy for testicular cancer. NUCLEAR MEDICINE PET SKULL BASE TO THIGH Fasting Blood Glucose: 129 Technique: 18.0 mCi F-18 FDG was injected intravenously. CT data was obtained and used for attenuation correction and anatomic localization only. (This was not acquired as a diagnostic CT examination.) Additional exam technical data entered on technologist worksheet. Comparison: CT 12/10/2010 Findings: Neck: No hypermetabolic nodes in the neck. Chest: No axillary or supraclavicular hypermetabolic nodes. No mediastinal hypermetabolic nodes. No suspicious pulmonary nodules. Abdomen / Pelvis:Hypermetabolic retrocrural lymph nodes which are enlarged. The most superior enlarged node extends to the level of the gastroesophageal junction and measures 19 mm with SUV max = 9.9. This node extends contiguously to the level of the kidneys where it measures 2.5 x 5.8 cm with a similar intense metabolic activity. There is a nodal conglomerate surrounding the aorta and inferior vena cava which measures 13 cm x 7.2 cm axial dimension (image 168). This also has intense metabolic activity with SUV max = 12.8. This adenopathy extends to the aortic bifurcation and along the right common iliac nodal stations. Metabolic activity also extends into the presacral space with an enlarged hypermetabolic node measuring 2.5 x 2.6 cm just anterior to the upper sacrum (image 213). There is hypermetabolic activity surrounding an enlarged right common iliac external  iliac node. There is high density within the lumen of this vessel when compared to the contralateral vein. Favor this to represent inflammation associated with thrombus within this vessel. There is edema and asymmetric enlargement of the proximal right thigh also suggesting deep venous thrombosis. There are enlarged hypermetabolic right inguinal nodes. The right testicle is enlarged to 9.3 x 7.5 cm with intense metabolic activity ( SUV max = 40.9. Skeleton:Review of bone windows demonstrates no aggressive osseous lesions. IMPRESSION: 1. Enlarged hypermetabolic right testicle consistent with primary testicular carcinoma. 2. Bulky retroperitoneal periaortic lymphadenopathy with intense metabolic activity. 3. The most superior extent of the metabolic adenopathy is a retrocrural lymph node. 4. Adenopathy extends into the right inguinal region. 5. Enlarged right common iliac and external iliac vein with peripheral metabolic activity. This coupled with edema within the  proximal right thigh is consistent with deep venous thrombosis within the right deep pelvic vessels which likely extends into the lower extremity. Original Report Authenticated By: Genevive Bi, M.D.  Dg Chest Port 1 View  02/02/2011 *RADIOLOGY REPORT* Clinical Data: Pneumonia PORTABLE CHEST - 1 VIEW Comparison: 01/30/2011 CT Findings: Patchy airspace opacities, predominately peripheral, similar distribution to comparison CT, with slight interval improvement. Cardiomediastinal contours within normal limits. No pleural effusion or pneumothorax. No acute osseous abnormality. IMPRESSION: Bilateral patchy airspace opacities, with some interval improvement. Original Report Authenticated By: Waneta Martins, M.D.  Dg Chest Port 1 View  01/30/2011 *RADIOLOGY REPORT* Clinical Data: Shortness of breath. PORTABLE CHEST - 1 VIEW Comparison: 01/29/2011 Findings: Worsening patchy bilateral airspace disease, including the right upper lobe. Heart is normal size.  No effusions. No acute bony abnormality. IMPRESSION: Worsening patchy bilateral airspace disease. Given the appearance, distribution and normal heart size, I favor multifocal pneumonia. Atypical (noncardiogenic) edema is possible but felt less likely. Original Report Authenticated By: Cyndie Chime, M.D.  Dg Chest Port 1 View  01/28/2011 *RADIOLOGY REPORT* Clinical Data: History of recent pneumonia, follow-up PORTABLE CHEST - 1 VIEW Comparison: Chest x-ray of 01/26/2011 and CT chest of 01/26/2011. Findings: There has been improvement in the opacity noted previously the right lung base. However there is more opacity at the left lung base which may represent atelectasis or pneumonia. No effusion is seen. Mediastinal contours are stable. The heart is within upper limits of normal. No bony abnormality is seen. IMPRESSION: 1. Improvement right basilar opacity. 2. Slightly more prominent markings at the left lung base. Possible pneumonia. Original Report Authenticated By: Juline Patch, M.D.  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 Arline Asp and has received chemotherapy 4 days prior to admission, he now presents to [facility] ED with main concern of progressively worsening shortness of breath since receiving chemo associated with fatigue, nausea, vomiting, poor oral intake, diarrhea. He also tells me that approximately 1 week ago he was diagnosed with DVT and has been started on Lovenox injections. He also now reports fever, chills, but no chest pain, no specific urinary concerns, no headaches or visual changes, no neck stiffness, no known sick contacts or exposures.  Physical exam  General: Alert and oriented x3, no apparent distress, looks older than stated age  HEENT: Normocephalic, atraumatic, mucous membranes are moist  Cardiovascular: Regular rate and rhythm, S1-S2  Lungs: Improved aeration, no wheezes.  Abdomen: Soft, nontender, nondistended, positive bowel  sounds  Extremities: no edema, no cyanosis.  Hospital Course by problem list:  Principal Problem:  *Neutropenic fever  Active Problems:  Seminoma  Right leg DVT  ARF (acute renal failure)  PNA (pneumonia)  Thrombocytopenia  Thrush   1.Nosocomial PNA - Neutropenic due to Chemo for Seminoma complicated by SIRs and mild fluid overload-  The patient had a chest x-ray done in the hospital and was consistent with Pneumonia. Patient was pancultured and started on empiric antibiotics - discussed with Dr. Orvan Falconer on 01/27/2011. The patient had bilateral infiltrates arm on the x-ray and so he was was diuresed as needed with Lasix. Per Dr. Rito Ehrlich as a note off R. 02/03/2011 a 2-D echocardiogram was done and and showed normal systolic and diastolic. Pt also was hypotensive and stress dose steroids of were added as adrenal insufficiency was a concern since patient had been on dexamethasone as part of his chemotherapy treatments. The issue and responded well to this  interventions and gradually improved. Hypotension resolved and his stress dose steroids have been a gradually tapered and his blood pressures are remaining stable. The patient was initially neutropenic, but this resolved. He Completed 12 days of IV antibiotics and await DC'd , the cultures to date and negative and would not require any further antibiotics upon discharge, patient has remained afebrile, and clinically much improved. Had a CT angiogram which was negative for PE and a cardiac enzymes were done and were negative for MI  2. Leukocytosis  As above patient was initially neutropenic but it resolved as above. He subsequently developed a leukocytosis and he was on high-dose and stress steroids. The impression was that the leukocytosis was secondary to the steroids, as he was otherwise improving clinically and-a followup chest x-ray on 12/26 showed improvement and the arm bilateral airspace opacities, UA was unremarkable and the Clostridium  difficile PCR done on stool was negative. I discussed patient with Dr. Arline Asp Today, and he states that the leukocytosis-34 today from a peak of 37 is not concerning and agrees and that is likely secondary to the steroids okay for patient to be discharged home from his standpoint.  3. ARF (acute renal failure) with hyponatremia  - prerenal in etiology  - resolved with IVF.  4. Right leg DVT in the setting of Thrombocytopenia  The patient was maintained Lovenox during this hospital stay and is to continue this upon discharge had a CT angiogram of his chest which was negative for PE. He did Have thrombocytopenia during this hospital stay but now resolved-334 today prior to discharge.  5.Hyponatremia - resolved with hydration  6.Hypokalemia - potassium was repleted during this hospital stay the  7.Chr Diarrhea -stool was negative for C diff. Improved on PRN imodium.  8Seminoma- the patient is to followup with Dr. Westley Hummer to an as scheduled on 02/14/2011  Discharge Vitals: BP 153/73  Pulse 68  Temp(Src) 98.1 F (36.7 C) (Oral)  Resp 19  Ht 6' (1.829 m)  Wt 81.647 kg (180 lb)  BMI 24.41 kg/m2  SpO2 95%  Discharge Labs:    Results for orders placed during the hospital encounter of 01/26/11 (from the past 24 hour(s))    DIFFERENTIAL Status: Abnormal     Collection Time     02/07/11 3:56 AM    Component  Value  Range     Neutrophils Relative  93 (*)  43 - 77 (%)     Lymphocytes Relative  5 (*)  12 - 46 (%)     Monocytes Relative  2 (*)  3 - 12 (%)     Eosinophils Relative  0  0 - 5 (%)     Basophils Relative  0  0 - 1 (%)     Band Neutrophils  0  0 - 10 (%)     Metamyelocytes Relative  0      Myelocytes  0      Promyelocytes Absolute  0      Blasts  0      nRBC  0  0 (/100 WBC)     Neutro Abs  31.9 (*)  1.7 - 7.7 (K/uL)     Lymphs Abs  1.7  0.7 - 4.0 (K/uL)     Monocytes Absolute  0.7  0.1 - 1.0 (K/uL)     Eosinophils Absolute  0.0  0.0 - 0.7 (K/uL)     Basophils Absolute  0.0  0.0 -  0.1 (K/uL)     RBC  Morphology  RARE NRBCs      WBC Morphology  WHITE COUNT CONFIRMED ON SMEAR     CBC Status: Abnormal     Collection Time     02/07/11 3:56 AM    Component  Value  Range     WBC  34.3 (*)  4.0 - 10.5 (K/uL)     RBC  3.40 (*)  4.22 - 5.81 (MIL/uL)     Hemoglobin  10.5 (*)  13.0 - 17.0 (g/dL)     HCT  16.1 (*)  09.6 - 52.0 (%)     MCV  89.7  78.0 - 100.0 (fL)     MCH  30.9  26.0 - 34.0 (pg)     MCHC  34.4  30.0 - 36.0 (g/dL)     RDW  04.5  40.9 - 15.5 (%)     Platelets  334  150 - 400 (K/uL)    MAGNESIUM Status: Normal     Collection Time     02/07/11 3:56 AM    Component  Value  Range     Magnesium  1.9  1.5 - 2.5 (mg/dL)    BASIC METABOLIC PANEL Status: Normal     Collection Time     02/07/11 3:56 AM    Component  Value  Range     Sodium  135  135 - 145 (mEq/L)     Potassium  3.5  3.5 - 5.1 (mEq/L)     Chloride  102  96 - 112 (mEq/L)     CO2  26  19 - 32 (mEq/L)     Glucose, Bld  82  70 - 99 (mg/dL)     BUN  17  6 - 23 (mg/dL)     Creatinine, Ser  8.11  0.50 - 1.35 (mg/dL)     Calcium  8.5  8.4 - 10.5 (mg/dL)     GFR calc non Af Amer  >90  >90 (mL/min)     GFR calc Af Amer  >90  >90 (mL/min)     SignedDonnalee Curry C  02/07/2011, 1:41 PM

## 2011-02-07 NOTE — Progress Notes (Signed)
Name: Dennis [name]  MRN: [id number]  DOB: 09/28/1950 60 y.o.  Date of Admission: 01/26/2011 3:08 PM  Date of Discharge: 02/07/2011  Attending Physician: Rever Pichette C Kyston Gonce  Discharge Diagnosis:  Principal Problem:  *Neutropenic fever  Active Problems:  Seminoma  Right leg DVT  ARF (acute renal failure)  PNA (pneumonia)  Thrombocytopenia  Thrush   Discharge Medications:    Current Discharge Medication List       START taking these medications     Details    fentaNYL (DURAGESIC - DOSED MCG/HR) 12 MCG/HR  Place 1 patch (12.5 mcg total) onto the skin every 3 (three) days.  Qty: 10 patch, Refills: 0       fluconazole (DIFLUCAN) 100 MG tablet  Take 1 tablet (100 mg total) by mouth daily.  Qty: 7 tablet, Refills: 0     Associated Diagnoses: Neutropenic fever       oxyCODONE (OXY IR/ROXICODONE) 5 MG immediate release tablet  Take 1 tablet (5 mg total) by mouth every 4 (four) hours as needed.  Qty: 30 tablet, Refills: 0       pantoprazole (PROTONIX) 40 MG tablet  Take 1 tablet (40 mg total) by mouth daily at 12 noon.  Qty: 30 tablet, Refills: 0       potassium chloride SA (K-DUR,KLOR-CON) 20 MEQ tablet  Take 1 tablet (20 mEq total) by mouth daily.  Qty: 30 tablet, Refills: 0       predniSONE (DELTASONE) 20 MG tablet  Take 1 tablet (20 mg total) by mouth daily.  Qty: 30 tablet, Refills: 0          CONTINUE these medications which have NOT CHANGED     Details    allopurinol (ZYLOPRIM) 300 MG tablet  Take 1 tablet (300 mg total) by mouth daily.  Qty: 30 tablet, Refills: 3     Associated Diagnoses: Testicular cancer       enoxaparin (LOVENOX) 120 MG/0.8ML SOLN  Inject 0.8 mLs (120 mg total) into the skin daily.  Qty: 12.6 mL, Refills: 3     Associated Diagnoses: DVT (deep venous thrombosis)       prochlorperazine (COMPAZINE) 10 MG tablet  Take 10 mg by mouth every 6 (six) hours as needed. nausea       prochlorperazine (COMPAZINE) 25 MG suppository  Place 25 mg rectally every  12 (twelve) hours as needed. Nausea          STOP taking these medications        HYDROcodone-acetaminophen (NORCO) 10-325 MG per tablet            Disposition and follow-up:  Dennis [name] was discharged from [facility] in improved /stablecondition.  Follow-up Appointments:    Discharge Orders     Future Appointments:  Provider:  Department:  Dept Phone:  Center:     02/14/2011 10:00 AM  Shanna Riki Perkins  [facility]  [id number]  None     02/14/2011 10:30 AM  Donald S Murinson, MD  [facility]  [phone]  None     02/14/2011 11:30 AM  [facility]  [facility]  [id number]  None     02/15/2011 9:30 AM  [facility]  [facility]  [id number]  None     02/16/2011 9:15 AM  [facility]  [facility]  [phone]  None     02/17/2011 10:15 AM  [facility]  [facility]  [phone]  None     02/18/2011 9:00 AM    [facility]  [facility]  [phone]  None     02/19/2011 11:45 AM  [facility]  [facility]  [phone]  None        Future Orders  Please Complete By  Expires     Ambulatory referral to Home Health       Comments:     Please evaluate Dennis [name] for admission to Home Health.  Disciplines requested: Home health PT  Services to provide: Physical therapy  Physician to follow patient's care (the person listed here will be responsible for signing ongoing orders): Patient's primary care physician  Requested Start of Care Date:     Diet general       Increase activity slowly       Call MD for: temperature >100.4          Consultations: Treatment Team:  Donald S Murinson, MD  Procedures Performed:  Dg Chest 1 View  01/29/2011 *RADIOLOGY REPORT* Clinical Data: Pneumonia, follow-up CHEST - 1 VIEW Comparison: Portable chest x-ray of 01/28/2011 Findings: As noted on the prior chest x-ray there is slightly more opacity at the left lung base suspicious for pneumonia. The right lung base remains relatively well  aerated. No effusion is seen. Mediastinal contours are stable and heart size is stable. IMPRESSION: Persistent left basilar opacity most consistent with pneumonia. Original Report Authenticated By: PAUL D. BARRY, M.D.  Dg Chest 2 View  01/26/2011 *RADIOLOGY REPORT* Clinical Data: Shortness of breath CHEST - 2 VIEW Comparison: 12/10/2010 Findings: Cardiomediastinal silhouette is stable. Mild thoracic spine osteopenia. Hazy airspace disease right base suspicious for infiltrate. Trace left basilar atelectasis or infiltrate. No pulmonary edema. IMPRESSION: Hazy airspace disease right base suspicious for infiltrate. Trace left basilar atelectasis or infiltrate. No pulmonary edema. Original Report Authenticated By: LIVIU POP, M.D.  Ct Angio Chest W/cm &/or Wo Cm  01/30/2011 *RADIOLOGY REPORT* Clinical Data: Progressive shortness of breath, diaphoresis, history of DVT, metastatic seminoma, thrombocytopenia, pneumonia CT ANGIOGRAPHY CHEST WITH CONTRAST Technique: Multidetector CT imaging of the chest was performed using the standard protocol during bolus administration of intravenous contrast. Multiplanar CT image reconstructions including MIPs were obtained to evaluate the vascular anatomy. Contrast: 80 ml Omnipaque-300 Comparison: 01/30/2011, 01/26/2011 Findings: No significant central or proximal hilar pulmonary embolus or filling defect by CTA. Limited with some respiratory motion artifact. Minimal atherosclerotic calcifications. Thoracic aorta appears intact. No dissection or aneurysm. Normal heart size. Coronary calcifications noted. No pericardial effusion. Very small left pleural effusion identified. Included upper abdomen demonstrates no acute process. Lung windows demonstrate worsening patchy scattered airspace disease throughout both lungs diffusely, more pronounced in the lingula, both lower lobes, and the inferior right middle lobe. Degenerative changes of the spine. No acute osseous abnormality. Review of  the MIP images confirms the above findings. IMPRESSION: Negative for significant central or proximal hilar pulmonary embolus by CTA. Worsening bilateral patchy scattered airspace process, most pronounced in the left upper lobe, lingula, and both lower lobes, suspect multilobar pneumonia. New small left effusion. Original Report Authenticated By: M. TREVOR SHICK, M.D.  Ct Angio Chest W/cm &/or Wo Cm  01/26/2011 *RADIOLOGY REPORT* Clinical Data: Shortness of breath, recent DVT, rule out pulmonary embolus CT ANGIOGRAPHY CHEST WITH CONTRAST Technique: Multidetector CT imaging of the chest was performed using the standard protocol during bolus administration of intravenous contrast. Multiplanar CT image reconstructions including MIPs were obtained to evaluate the vascular anatomy. Contrast: 90mL OMNIPAQUE IOHEXOL 300 MG/ML IV SOLN Comparison: 12/10/2010 Findings: Images of the thoracic inlet   are unremarkable. Central airways are patent. Mild atherosclerotic calcifications of thoracic aorta. No hilar or mediastinal adenopathy. Sagittal images of the spine shows mild degenerative changes thoracic spine. No destructive bony lesions are noted. Sagittal view of the sternum is unremarkable. No pulmonary embolus is noted. Images of the lung parenchyma shows no pulmonary edema. In axial image 71 there is small amount of consolidation in the right middle lobe suspicious for infiltrate. Small amount of triangular- shaped consolidation noted in the left base anteriorly with air bronchogram suspicious for atelectasis or infiltrate. Mild bronchial thickening noted in the right middle lobe. The previous retrocrural adenopathy has decreased in size. Measures 3.1 x 2 cm on the prior exam measures 4.5 x 3.1 cm. Heart size is within normal limits. Atherosclerotic calcifications of the coronary arteries are noted. Review of the MIP images confirms the above findings. IMPRESSION: 1. No pulmonary embolus. 2. There is a small amount of  consolidation with air bronchogram in the right middle lobe suspicious for infiltrate. Small amount of atelectasis or infiltrate noted in the left base anteriorly. 3. No hilar or mediastinal adenopathy. 4. Decrease in size of retrocrural adenopathy measures 3.1 x 2 cm. On prior exam measures 4.5 x 3.1 cm. 5. No pulmonary edema. Original Report Authenticated By: LIVIU POP, M.D.  Nm Pet Image Initial (pi) Skull Base To Thigh  01/18/2011 *RADIOLOGY REPORT* Clinical Data: Initial treatment strategy for testicular cancer. NUCLEAR MEDICINE PET SKULL BASE TO THIGH Fasting Blood Glucose: 129 Technique: 18.0 mCi F-18 FDG was injected intravenously. CT data was obtained and used for attenuation correction and anatomic localization only. (This was not acquired as a diagnostic CT examination.) Additional exam technical data entered on technologist worksheet. Comparison: CT 12/10/2010 Findings: Neck: No hypermetabolic nodes in the neck. Chest: No axillary or supraclavicular hypermetabolic nodes. No mediastinal hypermetabolic nodes. No suspicious pulmonary nodules. Abdomen / Pelvis:Hypermetabolic retrocrural lymph nodes which are enlarged. The most superior enlarged node extends to the level of the gastroesophageal junction and measures 19 mm with SUV max = 9.9. This node extends contiguously to the level of the kidneys where it measures 2.5 x 5.8 cm with a similar intense metabolic activity. There is a nodal conglomerate surrounding the aorta and inferior vena cava which measures 13 cm x 7.2 cm axial dimension (image 168). This also has intense metabolic activity with SUV max = 12.8. This adenopathy extends to the aortic bifurcation and along the right common iliac nodal stations. Metabolic activity also extends into the presacral space with an enlarged hypermetabolic node measuring 2.5 x 2.6 cm just anterior to the upper sacrum (image 213). There is hypermetabolic activity surrounding an enlarged right common iliac external  iliac node. There is high density within the lumen of this vessel when compared to the contralateral vein. Favor this to represent inflammation associated with thrombus within this vessel. There is edema and asymmetric enlargement of the proximal right thigh also suggesting deep venous thrombosis. There are enlarged hypermetabolic right inguinal nodes. The right testicle is enlarged to 9.3 x 7.5 cm with intense metabolic activity ( SUV max = 14.2. Skeleton:Review of bone windows demonstrates no aggressive osseous lesions. IMPRESSION: 1. Enlarged hypermetabolic right testicle consistent with primary testicular carcinoma. 2. Bulky retroperitoneal periaortic lymphadenopathy with intense metabolic activity. 3. The most superior extent of the metabolic adenopathy is a retrocrural lymph node. 4. Adenopathy extends into the right inguinal region. 5. Enlarged right common iliac and external iliac vein with peripheral metabolic activity. This coupled with edema within the   proximal right thigh is consistent with deep venous thrombosis within the right deep pelvic vessels which likely extends into the lower extremity. Original Report Authenticated By: STEWART EDMUNDS, M.D.  Dg Chest Port 1 View  02/02/2011 *RADIOLOGY REPORT* Clinical Data: Pneumonia PORTABLE CHEST - 1 VIEW Comparison: 01/30/2011 CT Findings: Patchy airspace opacities, predominately peripheral, similar distribution to comparison CT, with slight interval improvement. Cardiomediastinal contours within normal limits. No pleural effusion or pneumothorax. No acute osseous abnormality. IMPRESSION: Bilateral patchy airspace opacities, with some interval improvement. Original Report Authenticated By: ANDREW J. DELGAIZO, M.D.  Dg Chest Port 1 View  01/30/2011 *RADIOLOGY REPORT* Clinical Data: Shortness of breath. PORTABLE CHEST - 1 VIEW Comparison: 01/29/2011 Findings: Worsening patchy bilateral airspace disease, including the right upper lobe. Heart is normal size.  No effusions. No acute bony abnormality. IMPRESSION: Worsening patchy bilateral airspace disease. Given the appearance, distribution and normal heart size, I favor multifocal pneumonia. Atypical (noncardiogenic) edema is possible but felt less likely. Original Report Authenticated By: KEVIN G. DOVER, M.D.  Dg Chest Port 1 View  01/28/2011 *RADIOLOGY REPORT* Clinical Data: History of recent pneumonia, follow-up PORTABLE CHEST - 1 VIEW Comparison: Chest x-ray of 01/26/2011 and CT chest of 01/26/2011. Findings: There has been improvement in the opacity noted previously the right lung base. However there is more opacity at the left lung base which may represent atelectasis or pneumonia. No effusion is seen. Mediastinal contours are stable. The heart is within upper limits of normal. No bony abnormality is seen. IMPRESSION: 1. Improvement right basilar opacity. 2. Slightly more prominent markings at the left lung base. Possible pneumonia. Original Report Authenticated By: PAUL D. BARRY, M.D.  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 Murinson and has received chemotherapy 4 days prior to admission, he now presents to [facility] ED with main concern of progressively worsening shortness of breath since receiving chemo associated with fatigue, nausea, vomiting, poor oral intake, diarrhea. He also tells me that approximately 1 week ago he was diagnosed with DVT and has been started on Lovenox injections. He also now reports fever, chills, but no chest pain, no specific urinary concerns, no headaches or visual changes, no neck stiffness, no known sick contacts or exposures.  Physical exam  General: Alert and oriented x3, no apparent distress, looks older than stated age  HEENT: Normocephalic, atraumatic, mucous membranes are moist  Cardiovascular: Regular rate and rhythm, S1-S2  Lungs: Improved aeration, no wheezes.  Abdomen: Soft, nontender, nondistended, positive bowel  sounds  Extremities: no edema, no cyanosis.  Hospital Course by problem list:  Principal Problem:  *Neutropenic fever  Active Problems:  Seminoma  Right leg DVT  ARF (acute renal failure)  PNA (pneumonia)  Thrombocytopenia  Thrush   1.Nosocomial PNA - Neutropenic due to Chemo for Seminoma complicated by SIRs and mild fluid overload-  The patient had a chest x-ray done in the hospital and was consistent with Pneumonia. Patient was pancultured and started on empiric antibiotics - discussed with Dr. Campbell on 01/27/2011. The patient had bilateral infiltrates arm on the x-ray and so he was was diuresed as needed with Lasix. Per Dr. Krishnan as a note off R. 02/03/2011 a 2-D echocardiogram was done and and showed normal systolic and diastolic. Pt also was hypotensive and stress dose steroids of were added as adrenal insufficiency was a concern since patient had been on dexamethasone as part of his chemotherapy treatments. The issue and responded well to this   interventions and gradually improved. Hypotension resolved and his stress dose steroids have been a gradually tapered and his blood pressures are remaining stable. The patient was initially neutropenic, but this resolved. He Completed 12 days of IV antibiotics and await DC'd , the cultures to date and negative and would not require any further antibiotics upon discharge, patient has remained afebrile, and clinically much improved. Had a CT angiogram which was negative for PE and a cardiac enzymes were done and were negative for MI  2. Leukocytosis  As above patient was initially neutropenic but it resolved as above. He subsequently developed a leukocytosis and he was on high-dose and stress steroids. The impression was that the leukocytosis was secondary to the steroids, as he was otherwise improving clinically and-a followup chest x-ray on 12/26 showed improvement and the arm bilateral airspace opacities, UA was unremarkable and the Clostridium  difficile PCR done on stool was negative. I discussed patient with Dr. Murinson Today, and he states that the leukocytosis-34 today from a peak of 37 is not concerning and agrees and that is likely secondary to the steroids okay for patient to be discharged home from his standpoint.  3. ARF (acute renal failure) with hyponatremia  - prerenal in etiology  - resolved with IVF.  4. Right leg DVT in the setting of Thrombocytopenia  The patient was maintained Lovenox during this hospital stay and is to continue this upon discharge had a CT angiogram of his chest which was negative for PE. He did Have thrombocytopenia during this hospital stay but now resolved-334 today prior to discharge.  5.Hyponatremia - resolved with hydration  6.Hypokalemia - potassium was repleted during this hospital stay the  7.Chr Diarrhea -stool was negative for C diff. Improved on PRN imodium.  8Seminoma- the patient is to followup with Dr. Murine to an as scheduled on 02/14/2011  Discharge Vitals: BP 153/73  Pulse 68  Temp(Src) 98.1 F (36.7 C) (Oral)  Resp 19  Ht 6' (1.829 m)  Wt 81.647 kg (180 lb)  BMI 24.41 kg/m2  SpO2 95%  Discharge Labs:    Results for orders placed during the hospital encounter of 01/26/11 (from the past 24 hour(s))    DIFFERENTIAL Status: Abnormal     Collection Time     02/07/11 3:56 AM    Component  Value  Range     Neutrophils Relative  93 (*)  43 - 77 (%)     Lymphocytes Relative  5 (*)  12 - 46 (%)     Monocytes Relative  2 (*)  3 - 12 (%)     Eosinophils Relative  0  0 - 5 (%)     Basophils Relative  0  0 - 1 (%)     Band Neutrophils  0  0 - 10 (%)     Metamyelocytes Relative  0      Myelocytes  0      Promyelocytes Absolute  0      Blasts  0      nRBC  0  0 (/100 WBC)     Neutro Abs  31.9 (*)  1.7 - 7.7 (K/uL)     Lymphs Abs  1.7  0.7 - 4.0 (K/uL)     Monocytes Absolute  0.7  0.1 - 1.0 (K/uL)     Eosinophils Absolute  0.0  0.0 - 0.7 (K/uL)     Basophils Absolute  0.0  0.0 -  0.1 (K/uL)     RBC   Morphology  RARE NRBCs      WBC Morphology  WHITE COUNT CONFIRMED ON SMEAR     CBC Status: Abnormal     Collection Time     02/07/11 3:56 AM    Component  Value  Range     WBC  34.3 (*)  4.0 - 10.5 (K/uL)     RBC  3.40 (*)  4.22 - 5.81 (MIL/uL)     Hemoglobin  10.5 (*)  13.0 - 17.0 (g/dL)     HCT  30.5 (*)  39.0 - 52.0 (%)     MCV  89.7  78.0 - 100.0 (fL)     MCH  30.9  26.0 - 34.0 (pg)     MCHC  34.4  30.0 - 36.0 (g/dL)     RDW  15.1  11.5 - 15.5 (%)     Platelets  334  150 - 400 (K/uL)    MAGNESIUM Status: Normal     Collection Time     02/07/11 3:56 AM    Component  Value  Range     Magnesium  1.9  1.5 - 2.5 (mg/dL)    BASIC METABOLIC PANEL Status: Normal     Collection Time     02/07/11 3:56 AM    Component  Value  Range     Sodium  135  135 - 145 (mEq/L)     Potassium  3.5  3.5 - 5.1 (mEq/L)     Chloride  102  96 - 112 (mEq/L)     CO2  26  19 - 32 (mEq/L)     Glucose, Bld  82  70 - 99 (mg/dL)     BUN  17  6 - 23 (mg/dL)     Creatinine, Ser  0.83  0.50 - 1.35 (mg/dL)     Calcium  8.5  8.4 - 10.5 (mg/dL)     GFR calc non Af Amer  >90  >90 (mL/min)     GFR calc Af Amer  >90  >90 (mL/min)     Signed:  Rody Keadle C  02/07/2011, 1:41 PM        

## 2011-02-07 NOTE — Progress Notes (Signed)
Pt discharged to home via wheelchair with family, discharge instructions reviewed with pt who verbalized understanding and new RX's given.

## 2011-02-07 NOTE — Progress Notes (Signed)
Subjective: Events since 12/24 noted. Pt overall status improving. Ambulating without difficulty, wants to go home. No acute bleeding. SOB improved.  Objective: Vital signs in last 24 hours: BP 153/73  Pulse 68  Temp(Src) 98.1 F (36.7 C) (Oral)  Resp 19  Ht 6' (1.829 m)  Wt 180 lb (81.647 kg)  BMI 24.41 kg/m2  SpO2 95%   Physical Exam: 60 y.o.  in no acute distress  A. and O. x3 HEENT: Sclera anicteric. Oral cavity without thrush or lesions. Neck supple.no cervical or supraclavicular adenopathy  Lungs: CTA.  Trace of rhonchi cleared with cough. No wheezing or rales. No axillary masses. CV regular rate and rhythm normal S1-S2, no murmur , rubs or gallops Abdomen soft nontender , bowel sounds x4  no hepatosplenomegaly GU/rectal: deferred. Extremities: no clubbing cyanosis . trace edema RLE. No petechial rash Neurologic: non focal    Lab Results: Labs:  CBC   Lab 02/07/11 0356 02/06/11 0350 02/05/11 0125 02/04/11 0449 02/03/11 0415  WBC 34.3* 35.2* 37.5* 29.3* 22.1*  HGB 10.5* 10.8* 10.8* 10.5* 10.4*  HCT 30.5* 30.5* 31.3* 29.5* 29.6*  PLT 334 287 209 112* 63*  MCV 89.7 88.9 88.2 88.3 87.6  MCH 30.9 31.5 30.4 31.4 30.8  MCHC 34.4 35.4 34.5 35.6 35.1  RDW 15.1 14.5 14.2 14.3 14.0  LYMPHSABS 1.7 6.0* 3.4 5.3* 2.0  MONOABS 0.7 1.1* 1.5* 0.9 2.7*  EOSABS 0.0 0.0 0.0 0.0 0.0  BASOSABS 0.0 0.0 0.0 0.0 0.0  BANDABS -- -- -- -- --    CMP    Lab 02/07/11 0356 02/06/11 0350 02/05/11 0520 02/05/11 0125 02/04/11 0449 02/03/11 0415  NA 135 135 139 136 -- 137  K 3.5 3.6 3.3* 3.3* -- 4.3  CL 102 102 104 103 -- 106  CO2 26 25 24 25  -- 24  GLUCOSE 82 97 91 95 -- 99  BUN 17 13 16 18  -- 27*  CREATININE 0.83 0.83 0.82 0.88 -- 0.85  CALCIUM 8.5 8.4 8.4 8.4 -- 8.4  MG 1.9 1.9 -- 2.0 2.0 2.1  AST -- -- -- -- -- --  ALT -- -- -- -- -- --  ALKPHOS -- -- -- -- -- --  BILITOT -- -- -- -- -- --        Component Value Date/Time   BILITOT 1.3* 01/26/2011 1616      Imaging  Studies:  No results found.   Assessment/Plan:  1. 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 S/P cycle 1 of Cisplatin and Etoposide given for 5 days starting on 01/17/11 with neulasta on day 6, 01/22/11  2. Severe pancytopenia, now day 22 post chemo. WBC recovered, now elevated due to reactive leukocytosis, steroids. 3. Multilobar pneumonia,Day# 12 Vanc/zosyn  . CT chest angio--no pulmonary emboli.  4.  Diarrhea---C.diff negative.  Improved 5. Extensive RLE DVT diagnosed 3weeks ago. On lovenox 120 mg/day--to continue at home. Holding coumadin for now.  6. Hypotension--improved with IVF and IV steroids.  7. Hyponatremia--resolved  8. Renal insufficiency---improved since admission.  9. Full code.  10. Deconditioning. Recommend aggressive mobilization, OOB and PT/OT if indicated.   Pt being discharged momentarily.  Ambulating with a cane.  Will stop allopurinol in view of uric acid on 12/21=0.7.  Insurance apparently will not cover outpatient PT/OT.  Elevated WBC could be due to steroids, neulasta and rebound effect.  Will check CBC on 02/10/11 and plan to re-evaluate pt in office on 02/14/11.  Pt will probably require additional time  to recover physically before resuming chemo.  Will be reducing doses.  Spoke with patient's father.    Myran Arcia S 02/07/2011 4:45 PM    LOS: 12 days   WERTMAN,SARA E 02/07/2011, 11:59 AM

## 2011-02-07 NOTE — Progress Notes (Signed)
Pt will call his primary doctor for Home Health PT, related to the office has already made arrangement for Home Health for the pt. Offices are closed at present time. mp

## 2011-02-07 NOTE — Progress Notes (Signed)
Physical Therapy Treatment D/C from acute care PT Patient Details Name: Dennis [name] MRN: [id number] DOB: 02/27/1950 Today's Date: 02/07/2011 Time: 4098-1191 Charge: Dennis [name] PT Assessment/Plan  PT - Assessment/Plan Comments on Treatment Session: Pt progressed very well and will be d/c from PT in acute care as he met all goals.  Continue to recommend HHPT for strength and endurance. PT Plan: Discharge plan remains appropriate Follow Up Recommendations: Home health PT Equipment Recommended: None recommended by PT PT Goals  Acute Rehab PT Goals PT Goal: Sit to Stand - Progress: Met PT Goal: Ambulate - Progress: Met PT Goal: Up/Down Stairs - Progress: Met PT Goal: Perform Home Exercise Program - Progress: Discontinued (comment) (will have HHPT)  PT Treatment Precautions/Restrictions  Precautions Precautions: Fall Restrictions Weight Bearing Restrictions: No Mobility (including Balance) Bed Mobility Bed Mobility: Yes Supine to Sit: 7: Independent Sit to Supine - Right: 7: Independent Transfers Transfers: Yes Sit to Stand: 6: Modified independent (Device/Increase time);From bed Stand to Sit: 6: Modified independent (Device/Increase time);To bed Ambulation/Gait Ambulation/Gait: Yes Ambulation/Gait Assistance: 6: Modified independent (Device/Increase time) Ambulation/Gait Assistance Details (indicate cue type and reason): 200 feet with IV pole and straight cane then 200 feet with only straight cane, no LOB observed, pt denies SOB Ambulation Distance (Feet): 400 Feet (total) Assistive device: Straight cane Gait Pattern: Step-through pattern Stairs: Yes Stairs Assistance: 6: Modified independent (Device/Increase time) Stair Management Technique: One rail Left;With cane;Step to pattern;Forwards Number of Stairs: 3     Exercise    End of Session PT - End of Session Activity Tolerance: Patient tolerated treatment well Patient left: in bed;with call bell in  reach General Behavior During Session: Dennis [name] Region for tasks performed Cognition: Dennis [name] for tasks performed  Dennis [name] 02/07/2011, 2:52 PM Pager: [phone]

## 2011-02-09 ENCOUNTER — Telehealth: Payer: Self-pay | Admitting: Oncology

## 2011-02-09 NOTE — Telephone Encounter (Signed)
TRIED TO CONTACT THE PT ON 12/31 AND DID NOT GET AN ANSWER,ALSO NO VM.  TRIED AGAIN ON 1/2 TO ADD LAB FOR 1/3 AND NA AT  11,2:26 AND 5:00.  lEFT VM FOR kATHY WITH THIS INFO    AOM

## 2011-02-10 ENCOUNTER — Ambulatory Visit (HOSPITAL_BASED_OUTPATIENT_CLINIC_OR_DEPARTMENT_OTHER): Payer: 59

## 2011-02-10 DIAGNOSIS — M7989 Other specified soft tissue disorders: Secondary | ICD-10-CM

## 2011-02-10 DIAGNOSIS — R5081 Fever presenting with conditions classified elsewhere: Secondary | ICD-10-CM

## 2011-02-10 DIAGNOSIS — J189 Pneumonia, unspecified organism: Secondary | ICD-10-CM

## 2011-02-10 DIAGNOSIS — D709 Neutropenia, unspecified: Secondary | ICD-10-CM

## 2011-02-10 DIAGNOSIS — C6290 Malignant neoplasm of unspecified testis, unspecified whether descended or undescended: Secondary | ICD-10-CM

## 2011-02-10 LAB — BASIC METABOLIC PANEL
BUN: 19 mg/dL (ref 6–23)
CO2: 29 mEq/L (ref 19–32)
Calcium: 8.8 mg/dL (ref 8.4–10.5)
Creatinine, Ser: 0.91 mg/dL (ref 0.50–1.35)
Glucose, Bld: 83 mg/dL (ref 70–99)
Sodium: 138 mEq/L (ref 135–145)

## 2011-02-10 LAB — CBC WITH DIFFERENTIAL/PLATELET
Basophils Absolute: 0 10*3/uL (ref 0.0–0.1)
Eosinophils Absolute: 0 10*3/uL (ref 0.0–0.5)
HCT: 34.3 % — ABNORMAL LOW (ref 38.4–49.9)
HGB: 11.7 g/dL — ABNORMAL LOW (ref 13.0–17.1)
LYMPH%: 4.9 % — ABNORMAL LOW (ref 14.0–49.0)
MONO#: 0 10*3/uL — ABNORMAL LOW (ref 0.1–0.9)
NEUT#: 16 10*3/uL — ABNORMAL HIGH (ref 1.5–6.5)
NEUT%: 94.9 % — ABNORMAL HIGH (ref 39.0–75.0)
Platelets: 441 10*3/uL — ABNORMAL HIGH (ref 140–400)
WBC: 16.8 10*3/uL — ABNORMAL HIGH (ref 4.0–10.3)

## 2011-02-13 ENCOUNTER — Other Ambulatory Visit: Payer: Self-pay | Admitting: Oncology

## 2011-02-13 LAB — BETA HCG QUANT (REF LAB): Beta hCG, Tumor Marker: 0.9 m[IU]/mL (ref <5.0)

## 2011-02-14 ENCOUNTER — Other Ambulatory Visit: Payer: Self-pay

## 2011-02-14 ENCOUNTER — Ambulatory Visit (HOSPITAL_COMMUNITY)
Admission: RE | Admit: 2011-02-14 | Discharge: 2011-02-14 | Disposition: A | Discharge status: Home / Self Care | Payer: 59 | Source: Ambulatory Visit | Attending: Oncology | Admitting: Oncology

## 2011-02-14 ENCOUNTER — Encounter: Payer: Self-pay | Admitting: Oncology

## 2011-02-14 ENCOUNTER — Ambulatory Visit: Payer: 59

## 2011-02-14 ENCOUNTER — Other Ambulatory Visit: Payer: 59 | Admitting: Lab

## 2011-02-14 ENCOUNTER — Telehealth: Payer: Self-pay | Admitting: Oncology

## 2011-02-14 ENCOUNTER — Ambulatory Visit (HOSPITAL_BASED_OUTPATIENT_CLINIC_OR_DEPARTMENT_OTHER): Payer: 59 | Admitting: Oncology

## 2011-02-14 VITALS — BP 101/66 | HR 78 | Temp 97.0°F | Ht 72.0 in | Wt 171.9 lb

## 2011-02-14 DIAGNOSIS — C6290 Malignant neoplasm of unspecified testis, unspecified whether descended or undescended: Secondary | ICD-10-CM

## 2011-02-14 DIAGNOSIS — I82409 Acute embolism and thrombosis of unspecified deep veins of unspecified lower extremity: Secondary | ICD-10-CM

## 2011-02-14 DIAGNOSIS — J189 Pneumonia, unspecified organism: Secondary | ICD-10-CM | POA: Insufficient documentation

## 2011-02-14 DIAGNOSIS — Z7901 Long term (current) use of anticoagulants: Secondary | ICD-10-CM

## 2011-02-14 DIAGNOSIS — Z79899 Other long term (current) drug therapy: Secondary | ICD-10-CM

## 2011-02-14 LAB — HEPATIC FUNCTION PANEL
Alkaline Phosphatase: 93 U/L (ref 39–117)
Indirect Bilirubin: 0.2 mg/dL (ref 0.0–0.9)
Total Protein: 6.4 g/dL (ref 6.0–8.3)

## 2011-02-14 NOTE — Progress Notes (Signed)
This office note has been dictated.   #[id number]
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
## 2011-02-15 ENCOUNTER — Ambulatory Visit: Payer: 59

## 2011-02-16 ENCOUNTER — Ambulatory Visit: Payer: 59

## 2011-02-17 ENCOUNTER — Ambulatory Visit: Payer: 59

## 2011-02-18 ENCOUNTER — Ambulatory Visit: Payer: 59

## 2011-02-19 ENCOUNTER — Ambulatory Visit: Payer: 59

## 2011-02-22 ENCOUNTER — Other Ambulatory Visit (HOSPITAL_BASED_OUTPATIENT_CLINIC_OR_DEPARTMENT_OTHER): Payer: 59

## 2011-02-22 ENCOUNTER — Ambulatory Visit (HOSPITAL_BASED_OUTPATIENT_CLINIC_OR_DEPARTMENT_OTHER): Payer: 59

## 2011-02-22 ENCOUNTER — Ambulatory Visit (HOSPITAL_BASED_OUTPATIENT_CLINIC_OR_DEPARTMENT_OTHER): Payer: 59 | Admitting: Physician Assistant

## 2011-02-22 VITALS — BP 110/78 | HR 74 | Temp 97.0°F | Ht 72.0 in | Wt 174.9 lb

## 2011-02-22 DIAGNOSIS — C6290 Malignant neoplasm of unspecified testis, unspecified whether descended or undescended: Secondary | ICD-10-CM

## 2011-02-22 DIAGNOSIS — Z86718 Personal history of other venous thrombosis and embolism: Secondary | ICD-10-CM

## 2011-02-22 DIAGNOSIS — C772 Secondary and unspecified malignant neoplasm of intra-abdominal lymph nodes: Secondary | ICD-10-CM

## 2011-02-22 DIAGNOSIS — Z09 Encounter for follow-up examination after completed treatment for conditions other than malignant neoplasm: Secondary | ICD-10-CM

## 2011-02-22 DIAGNOSIS — Z5111 Encounter for antineoplastic chemotherapy: Secondary | ICD-10-CM

## 2011-02-22 LAB — COMPREHENSIVE METABOLIC PANEL
AST: 19 U/L (ref 0–37)
BUN: 21 mg/dL (ref 6–23)
CO2: 21 mEq/L (ref 19–32)
Calcium: 9 mg/dL (ref 8.4–10.5)
Chloride: 105 mEq/L (ref 96–112)
Creatinine, Ser: 0.95 mg/dL (ref 0.50–1.35)

## 2011-02-22 LAB — CBC WITH DIFFERENTIAL/PLATELET
BASO%: 1.1 % (ref 0.0–2.0)
HCT: 37.5 % — ABNORMAL LOW (ref 38.4–49.9)
LYMPH%: 10.9 % — ABNORMAL LOW (ref 14.0–49.0)
MCHC: 32.5 g/dL (ref 32.0–36.0)
MCV: 92.8 fL (ref 79.3–98.0)
MONO#: 0.5 10*3/uL (ref 0.1–0.9)
MONO%: 6.3 % (ref 0.0–14.0)
NEUT%: 78.5 % — ABNORMAL HIGH (ref 39.0–75.0)
Platelets: 207 10*3/uL (ref 140–400)
WBC: 8.6 10*3/uL (ref 4.0–10.3)

## 2011-02-22 LAB — LACTATE DEHYDROGENASE: LDH: 137 U/L (ref 94–250)

## 2011-02-22 MED ORDER — SODIUM CHLORIDE 0.9 % IV SOLN
Freq: Once | INTRAVENOUS | Status: AC
  Administered 2011-02-22: 10:00:00 via INTRAVENOUS

## 2011-02-22 MED ORDER — SODIUM CHLORIDE 0.9 % IV SOLN
100.0000 mg/m2 | Freq: Once | INTRAVENOUS | Status: AC
  Administered 2011-02-22: 200 mg via INTRAVENOUS
  Filled 2011-02-22: qty 10

## 2011-02-22 MED ORDER — SODIUM CHLORIDE 0.9 % IV SOLN
20.0000 mg/m2 | Freq: Once | INTRAVENOUS | Status: AC
  Administered 2011-02-22: 41 mg via INTRAVENOUS
  Filled 2011-02-22: qty 41

## 2011-02-22 MED ORDER — DEXTROSE-NACL 5-0.45 % IV SOLN
Freq: Once | INTRAVENOUS | Status: AC
  Administered 2011-02-22: 10:00:00 via INTRAVENOUS
  Filled 2011-02-22: qty 10

## 2011-02-22 MED ORDER — ONDANSETRON 16 MG/50ML IVPB (CHCC)
16.0000 mg | Freq: Once | INTRAVENOUS | Status: AC
  Administered 2011-02-22: 16 mg via INTRAVENOUS

## 2011-02-22 MED ORDER — DEXAMETHASONE SODIUM PHOSPHATE 4 MG/ML IJ SOLN
20.0000 mg | Freq: Once | INTRAMUSCULAR | Status: AC
  Administered 2011-02-22: 20 mg via INTRAVENOUS

## 2011-02-22 NOTE — Progress Notes (Signed)
CC:   Dennis [name], M.D.  INTERIM HISTORY:  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  He is evaluated today prior to his next cycle of chemotherapy with cisplatin and VP-16.  The patient reports since his last clinic visit on 02/14/2011 he has had improvement in his overall energy level.  He has not had any fevers, chills or night sweats.  No dyspnea or cough.  He reports a normal appetite and has not experienced any nausea, vomiting, constipation or diarrhea.  No rectal bleeding.  No dysuria, no frequency or hematuria.  He also states that his testis has further decreased in size and he is not experiencing any pain in this area.  He also states that he no longer has any edema in his right lower extremity.  He does continue on daily Lovenox injections for history of DVT in the right lower extremity.  He has had no changes in medications over the last week, and other medications are reviewed and recorded.  PHYSICAL EXAMINATION:  Temperature today is 97, heart rate 74, respirations 20, blood pressure 110/78, weight 174.9 pounds.  General: This is a well-developed, well-nourished white male in no acute distress.  HEENT:  Sclerae are nonicteric.  There is no oral thrush or mucositis.  Skin:  Without rashes or lesions.  He does have a few scabbed areas noted on his hands.  Lymph:  No cervical, supraclavicular, axillary or inguinal lymphadenopathy.  Cardiac:  Regular rate and rhythm without murmurs or gallops.  Peripheral pulses are 2+.  Chest:  Lungs are clear to auscultation.  Abdomen:  Positive bowel sounds, soft, nontender, nondistended.  There is no organomegaly.  Extremities:  No edema, cyanosis or calf tenderness.  Neurologic:  Alert and oriented x3. Strength, sensation and coordination all grossly intact.  LABORATORY DATA:  CBC with diff reveals white count of 8.6,  hemoglobin 12.2, hematocrit 37.5, platelets 207, ANC 6.7, MCV 92.8.  CMET and LDH are currently pending.  IMPRESSION/PLAN: 1. [name] is a 61 year old white male with stage IIC     seminoma with massive retroperitoneal disease as well as massive     right testis with elevated beta HCG and LDH at the time of     diagnosis.  He is status post 1 cycle of chemotherapy with full     dose cisplatin and VP-16 for 5 days beginning 12/10 with Neulasta     on day 6.  He did have marked neutropenia after his first cycle,     therefore cycle 2 was delayed by 1 week and we will also plan to     shorten the interval of treatment to 3 days instead of 5 with     Neulasta on day 4.  The plan is to dose reduce his treatment by     40%.  Reinforced teaching of treatment potential side effects and     management of side effects with emphasis on potential for fatigue,     myelosuppression, mucositis, nausea, vomiting, diarrhea, peripheral     neuropathy, arthralgias and alopecia, and the patient verbalizes     understanding. 2. Patient with extensive deep venous thrombosis up to the common     femoral vein involving the right leg diagnosed 01/17/2011.  He is     anticoagulated with Lovenox which he injects daily.  He has had no     issues  with bruising or bleeding with the Lovenox injections. 3. Patient will have weekly CBC with diff, and will plan to follow up     with Dr. Arline [name] in 3 weeks time at which time we will reassess     CBC with diff, CMET, LDH and beta HCG.  We anticipate him receiving     another     cycle of chemotherapy after M.D. visit.  The patient is advised to     call in the interim if any questions or problems.    ______________________________ Sherilyn Banker, MSN, ANP, BC RJ/MEDQ  D:  02/22/2011  T:  02/22/2011  Job:  [id number]

## 2011-02-22 NOTE — Patient Instructions (Signed)
Patient aware of next appointment.

## 2011-02-22 NOTE — Progress Notes (Signed)
This office note has been dictated.

## 2011-02-22 NOTE — Progress Notes (Signed)
Pre-cisplatin urine output = 400cc Post-cisplatin urine output = 350cc

## 2011-02-23 ENCOUNTER — Ambulatory Visit (HOSPITAL_BASED_OUTPATIENT_CLINIC_OR_DEPARTMENT_OTHER): Payer: 59

## 2011-02-23 VITALS — BP 123/73 | HR 74 | Temp 98.1°F

## 2011-02-23 DIAGNOSIS — Z5111 Encounter for antineoplastic chemotherapy: Secondary | ICD-10-CM

## 2011-02-23 DIAGNOSIS — C6290 Malignant neoplasm of unspecified testis, unspecified whether descended or undescended: Secondary | ICD-10-CM

## 2011-02-23 MED ORDER — POTASSIUM CHLORIDE 2 MEQ/ML IV SOLN
Freq: Once | INTRAVENOUS | Status: AC
  Administered 2011-02-23: 09:00:00 via INTRAVENOUS
  Filled 2011-02-23: qty 10

## 2011-02-23 MED ORDER — SODIUM CHLORIDE 0.9 % IV SOLN
20.0000 mg/m2 | Freq: Once | INTRAVENOUS | Status: AC
  Administered 2011-02-23: 41 mg via INTRAVENOUS
  Filled 2011-02-23: qty 41

## 2011-02-23 MED ORDER — SODIUM CHLORIDE 0.9 % IV SOLN
100.0000 mg/m2 | Freq: Once | INTRAVENOUS | Status: AC
  Administered 2011-02-23: 200 mg via INTRAVENOUS
  Filled 2011-02-23: qty 10

## 2011-02-23 MED ORDER — SODIUM CHLORIDE 0.9 % IV SOLN
Freq: Once | INTRAVENOUS | Status: AC
  Administered 2011-02-23: 09:00:00 via INTRAVENOUS

## 2011-02-23 MED ORDER — DEXAMETHASONE SODIUM PHOSPHATE 4 MG/ML IJ SOLN
20.0000 mg | Freq: Once | INTRAMUSCULAR | Status: AC
  Administered 2011-02-23: 20 mg via INTRAVENOUS

## 2011-02-23 MED ORDER — ONDANSETRON 16 MG/50ML IVPB (CHCC)
16.0000 mg | Freq: Once | INTRAVENOUS | Status: AC
  Administered 2011-02-23: 16 mg via INTRAVENOUS

## 2011-02-23 NOTE — Patient Instructions (Signed)
1515 Pt ambulatory upon discharge and verbalized understanding of next appt date/time.

## 2011-02-24 ENCOUNTER — Ambulatory Visit (HOSPITAL_BASED_OUTPATIENT_CLINIC_OR_DEPARTMENT_OTHER): Payer: 59

## 2011-02-24 VITALS — BP 130/83 | HR 68 | Temp 97.6°F

## 2011-02-24 DIAGNOSIS — C778 Secondary and unspecified malignant neoplasm of lymph nodes of multiple regions: Secondary | ICD-10-CM

## 2011-02-24 DIAGNOSIS — C6290 Malignant neoplasm of unspecified testis, unspecified whether descended or undescended: Secondary | ICD-10-CM

## 2011-02-24 DIAGNOSIS — Z5111 Encounter for antineoplastic chemotherapy: Secondary | ICD-10-CM

## 2011-02-24 MED ORDER — DEXAMETHASONE SODIUM PHOSPHATE 4 MG/ML IJ SOLN
20.0000 mg | Freq: Once | INTRAMUSCULAR | Status: AC
  Administered 2011-02-24: 20 mg via INTRAVENOUS

## 2011-02-24 MED ORDER — SODIUM CHLORIDE 0.9 % IV SOLN
20.0000 mg/m2 | Freq: Once | INTRAVENOUS | Status: AC
  Administered 2011-02-24: 41 mg via INTRAVENOUS
  Filled 2011-02-24: qty 41

## 2011-02-24 MED ORDER — ONDANSETRON 16 MG/50ML IVPB (CHCC)
16.0000 mg | Freq: Once | INTRAVENOUS | Status: AC
  Administered 2011-02-24: 16 mg via INTRAVENOUS

## 2011-02-24 MED ORDER — POTASSIUM CHLORIDE 2 MEQ/ML IV SOLN
Freq: Once | INTRAVENOUS | Status: AC
  Administered 2011-02-24: 10:00:00 via INTRAVENOUS
  Filled 2011-02-24: qty 10

## 2011-02-24 MED ORDER — SODIUM CHLORIDE 0.9 % IV SOLN
Freq: Once | INTRAVENOUS | Status: AC
  Administered 2011-02-24: 09:00:00 via INTRAVENOUS

## 2011-02-24 MED ORDER — SODIUM CHLORIDE 0.9 % IV SOLN
100.0000 mg/m2 | Freq: Once | INTRAVENOUS | Status: AC
  Administered 2011-02-24: 200 mg via INTRAVENOUS
  Filled 2011-02-24: qty 10

## 2011-02-24 NOTE — Progress Notes (Signed)
Pt voided 500ccs prior to Cisplatin, voided 1355 during rest of treatment.

## 2011-02-25 ENCOUNTER — Ambulatory Visit (HOSPITAL_BASED_OUTPATIENT_CLINIC_OR_DEPARTMENT_OTHER): Payer: 59

## 2011-02-25 VITALS — BP 122/79 | HR 73 | Temp 97.1°F

## 2011-02-25 DIAGNOSIS — C6290 Malignant neoplasm of unspecified testis, unspecified whether descended or undescended: Secondary | ICD-10-CM

## 2011-02-25 DIAGNOSIS — Z5189 Encounter for other specified aftercare: Secondary | ICD-10-CM

## 2011-02-25 MED ORDER — PEGFILGRASTIM INJECTION 6 MG/0.6ML
6.0000 mg | Freq: Once | SUBCUTANEOUS | Status: AC
  Administered 2011-02-25: 6 mg via SUBCUTANEOUS
  Filled 2011-02-25: qty 0.6

## 2011-03-01 ENCOUNTER — Other Ambulatory Visit: Payer: 59 | Admitting: Lab

## 2011-03-01 DIAGNOSIS — C6290 Malignant neoplasm of unspecified testis, unspecified whether descended or undescended: Secondary | ICD-10-CM

## 2011-03-01 LAB — CBC WITH DIFFERENTIAL/PLATELET
Basophils Absolute: 0 10*3/uL (ref 0.0–0.1)
Eosinophils Absolute: 0.1 10*3/uL (ref 0.0–0.5)
HGB: 11.6 g/dL — ABNORMAL LOW (ref 13.0–17.1)
NEUT#: 7.5 10*3/uL — ABNORMAL HIGH (ref 1.5–6.5)
RDW: 16.4 % — ABNORMAL HIGH (ref 11.0–14.6)
lymph#: 0.4 10*3/uL — ABNORMAL LOW (ref 0.9–3.3)

## 2011-03-01 LAB — BASIC METABOLIC PANEL
BUN: 31 mg/dL — ABNORMAL HIGH (ref 6–23)
Chloride: 102 mEq/L (ref 96–112)
Glucose, Bld: 94 mg/dL (ref 70–99)
Potassium: 4.9 mEq/L (ref 3.5–5.3)

## 2011-03-07 ENCOUNTER — Other Ambulatory Visit: Payer: Self-pay | Admitting: Medical Oncology

## 2011-03-07 NOTE — Telephone Encounter (Signed)
Pt's mother called stating that pt needs refills on his lovenox. She also stated she only got 24. I called [facility] and I was told pt still has refills and he did receive 30 injections. I called pt's mother back with the information.

## 2011-03-08 ENCOUNTER — Other Ambulatory Visit (HOSPITAL_BASED_OUTPATIENT_CLINIC_OR_DEPARTMENT_OTHER): Payer: 59 | Admitting: Lab

## 2011-03-08 DIAGNOSIS — C6290 Malignant neoplasm of unspecified testis, unspecified whether descended or undescended: Secondary | ICD-10-CM

## 2011-03-08 LAB — BASIC METABOLIC PANEL
Calcium: 8.8 mg/dL (ref 8.4–10.5)
Creatinine, Ser: 0.8 mg/dL (ref 0.50–1.35)
Glucose, Bld: 93 mg/dL (ref 70–99)
Sodium: 139 mEq/L (ref 135–145)

## 2011-03-08 LAB — CBC WITH DIFFERENTIAL/PLATELET
Eosinophils Absolute: 0 10*3/uL (ref 0.0–0.5)
LYMPH%: 10 % — ABNORMAL LOW (ref 14.0–49.0)
MCHC: 33.9 g/dL (ref 32.0–36.0)
MCV: 94.4 fL (ref 79.3–98.0)
MONO%: 0.7 % (ref 0.0–14.0)
NEUT#: 7 10*3/uL — ABNORMAL HIGH (ref 1.5–6.5)
Platelets: 117 10*3/uL — ABNORMAL LOW (ref 140–400)
RBC: 3.57 10*6/uL — ABNORMAL LOW (ref 4.20–5.82)

## 2011-03-14 ENCOUNTER — Ambulatory Visit (HOSPITAL_BASED_OUTPATIENT_CLINIC_OR_DEPARTMENT_OTHER): Payer: 59 | Admitting: Oncology

## 2011-03-14 ENCOUNTER — Other Ambulatory Visit: Payer: 59 | Admitting: Lab

## 2011-03-14 ENCOUNTER — Other Ambulatory Visit: Payer: Self-pay | Admitting: Medical Oncology

## 2011-03-14 ENCOUNTER — Encounter: Payer: Self-pay | Admitting: Oncology

## 2011-03-14 VITALS — BP 113/72 | HR 87 | Temp 97.2°F | Ht 72.0 in | Wt 184.7 lb

## 2011-03-14 DIAGNOSIS — C6290 Malignant neoplasm of unspecified testis, unspecified whether descended or undescended: Secondary | ICD-10-CM

## 2011-03-14 LAB — COMPREHENSIVE METABOLIC PANEL
ALT: 10 U/L (ref 0–53)
AST: 14 U/L (ref 0–37)
Albumin: 3.3 g/dL — ABNORMAL LOW (ref 3.5–5.2)
CO2: 24 mEq/L (ref 19–32)
Calcium: 9.5 mg/dL (ref 8.4–10.5)
Chloride: 102 mEq/L (ref 96–112)
Potassium: 4.2 mEq/L (ref 3.5–5.3)
Sodium: 137 mEq/L (ref 135–145)
Total Protein: 7.3 g/dL (ref 6.0–8.3)

## 2011-03-14 LAB — CBC WITH DIFFERENTIAL/PLATELET
BASO%: 0.1 % (ref 0.0–2.0)
HCT: 33.4 % — ABNORMAL LOW (ref 38.4–49.9)
MCHC: 33.9 g/dL (ref 32.0–36.0)
MONO#: 0.4 10*3/uL (ref 0.1–0.9)
NEUT%: 94 % — ABNORMAL HIGH (ref 39.0–75.0)
RBC: 3.54 10*6/uL — ABNORMAL LOW (ref 4.20–5.82)
RDW: 20.4 % — ABNORMAL HIGH (ref 11.0–14.6)
WBC: 15 10*3/uL — ABNORMAL HIGH (ref 4.0–10.3)
lymph#: 0.5 10*3/uL — ABNORMAL LOW (ref 0.9–3.3)

## 2011-03-14 LAB — LACTATE DEHYDROGENASE: LDH: 158 U/L (ref 94–250)

## 2011-03-14 MED ORDER — PANTOPRAZOLE SODIUM 40 MG PO TBEC: 40.0000 mg | DELAYED_RELEASE_TABLET | Freq: Every day | ORAL | Status: DC

## 2011-03-14 MED ORDER — POTASSIUM CHLORIDE CRYS ER 20 MEQ PO TBCR: 20.0000 meq | EXTENDED_RELEASE_TABLET | Freq: Every day | ORAL | Status: DC

## 2011-03-14 NOTE — Progress Notes (Signed)
This office note has been dictated.  #[id number]

## 2011-03-15 ENCOUNTER — Other Ambulatory Visit: Payer: 59 | Admitting: Lab

## 2011-03-15 ENCOUNTER — Ambulatory Visit (HOSPITAL_BASED_OUTPATIENT_CLINIC_OR_DEPARTMENT_OTHER): Payer: 59

## 2011-03-15 VITALS — BP 113/73 | HR 69 | Temp 97.6°F

## 2011-03-15 DIAGNOSIS — C772 Secondary and unspecified malignant neoplasm of intra-abdominal lymph nodes: Secondary | ICD-10-CM

## 2011-03-15 DIAGNOSIS — C6290 Malignant neoplasm of unspecified testis, unspecified whether descended or undescended: Secondary | ICD-10-CM

## 2011-03-15 DIAGNOSIS — Z5111 Encounter for antineoplastic chemotherapy: Secondary | ICD-10-CM

## 2011-03-15 MED ORDER — DEXAMETHASONE SODIUM PHOSPHATE 4 MG/ML IJ SOLN
20.0000 mg | Freq: Once | INTRAMUSCULAR | Status: AC
  Administered 2011-03-15: 20 mg via INTRAVENOUS

## 2011-03-15 MED ORDER — ONDANSETRON 16 MG/50ML IVPB (CHCC)
16.0000 mg | Freq: Once | INTRAVENOUS | Status: AC
  Administered 2011-03-15: 16 mg via INTRAVENOUS

## 2011-03-15 MED ORDER — SODIUM CHLORIDE 0.9 % IV SOLN
20.0000 mg/m2 | Freq: Once | INTRAVENOUS | Status: AC
  Administered 2011-03-15: 41 mg via INTRAVENOUS
  Filled 2011-03-15: qty 41

## 2011-03-15 MED ORDER — SODIUM CHLORIDE 0.9 % IV SOLN
100.0000 mg/m2 | Freq: Once | INTRAVENOUS | Status: AC
  Administered 2011-03-15: 200 mg via INTRAVENOUS
  Filled 2011-03-15: qty 10

## 2011-03-15 MED ORDER — POTASSIUM CHLORIDE 2 MEQ/ML IV SOLN
Freq: Once | INTRAVENOUS | Status: AC
  Administered 2011-03-15: 11:00:00 via INTRAVENOUS
  Filled 2011-03-15: qty 10
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
## 2011-03-15 NOTE — Progress Notes (Signed)
Patient voided 525cc urine prior to initiation of cisplatinum/etoposide therapy.

## 2011-03-16 ENCOUNTER — Ambulatory Visit (HOSPITAL_BASED_OUTPATIENT_CLINIC_OR_DEPARTMENT_OTHER): Payer: 59

## 2011-03-16 VITALS — BP 119/79 | HR 80 | Temp 97.0°F

## 2011-03-16 DIAGNOSIS — Z5111 Encounter for antineoplastic chemotherapy: Secondary | ICD-10-CM

## 2011-03-16 DIAGNOSIS — C6290 Malignant neoplasm of unspecified testis, unspecified whether descended or undescended: Secondary | ICD-10-CM

## 2011-03-16 MED ORDER — SODIUM CHLORIDE 0.9 % IV SOLN
Freq: Once | INTRAVENOUS | Status: AC
  Administered 2011-03-16: 10:00:00 via INTRAVENOUS

## 2011-03-16 MED ORDER — SODIUM CHLORIDE 0.9 % IV SOLN
20.0000 mg/m2 | Freq: Once | INTRAVENOUS | Status: AC
  Administered 2011-03-16: 41 mg via INTRAVENOUS
  Filled 2011-03-16: qty 41

## 2011-03-16 MED ORDER — DEXAMETHASONE SODIUM PHOSPHATE 4 MG/ML IJ SOLN
20.0000 mg | Freq: Once | INTRAMUSCULAR | Status: AC
  Administered 2011-03-16: 20 mg via INTRAVENOUS

## 2011-03-16 MED ORDER — POTASSIUM CHLORIDE 2 MEQ/ML IV SOLN
Freq: Once | INTRAVENOUS | Status: AC
  Administered 2011-03-16: 10:00:00 via INTRAVENOUS
  Filled 2011-03-16: qty 10

## 2011-03-16 MED ORDER — SODIUM CHLORIDE 0.9 % IV SOLN
100.0000 mg/m2 | Freq: Once | INTRAVENOUS | Status: AC
  Administered 2011-03-16: 200 mg via INTRAVENOUS
  Filled 2011-03-16: qty 10

## 2011-03-16 MED ORDER — ONDANSETRON 16 MG/50ML IVPB (CHCC)
16.0000 mg | Freq: Once | INTRAVENOUS | Status: AC
  Administered 2011-03-16: 16 mg via INTRAVENOUS

## 2011-03-16 NOTE — Patient Instructions (Signed)
Patient discharged home with no complaints of pain; tolerated treatment well.

## 2011-03-16 NOTE — Progress Notes (Signed)
1120:  Urine output = 875cc pre Cisplatin.  1430:  Urine output= 500cc post Cispatin.

## 2011-03-17 ENCOUNTER — Ambulatory Visit (HOSPITAL_BASED_OUTPATIENT_CLINIC_OR_DEPARTMENT_OTHER): Payer: 59

## 2011-03-17 VITALS — BP 117/76 | HR 64 | Temp 98.5°F

## 2011-03-17 DIAGNOSIS — C772 Secondary and unspecified malignant neoplasm of intra-abdominal lymph nodes: Secondary | ICD-10-CM

## 2011-03-17 DIAGNOSIS — C6290 Malignant neoplasm of unspecified testis, unspecified whether descended or undescended: Secondary | ICD-10-CM

## 2011-03-17 DIAGNOSIS — Z5111 Encounter for antineoplastic chemotherapy: Secondary | ICD-10-CM

## 2011-03-17 MED ORDER — DEXAMETHASONE SODIUM PHOSPHATE 4 MG/ML IJ SOLN
20.0000 mg | Freq: Once | INTRAMUSCULAR | Status: AC
  Administered 2011-03-17: 20 mg via INTRAVENOUS

## 2011-03-17 MED ORDER — SODIUM CHLORIDE 0.9 % IV SOLN
100.0000 mg/m2 | Freq: Once | INTRAVENOUS | Status: AC
  Administered 2011-03-17: 200 mg via INTRAVENOUS
  Filled 2011-03-17: qty 10

## 2011-03-17 MED ORDER — SODIUM CHLORIDE 0.9 % IV SOLN
Freq: Once | INTRAVENOUS | Status: AC
  Administered 2011-03-17: 10:00:00 via INTRAVENOUS

## 2011-03-17 MED ORDER — SODIUM CHLORIDE 0.9 % IV SOLN
20.0000 mg/m2 | Freq: Once | INTRAVENOUS | Status: AC
  Administered 2011-03-17: 41 mg via INTRAVENOUS
  Filled 2011-03-17: qty 41

## 2011-03-17 MED ORDER — ONDANSETRON 16 MG/50ML IVPB (CHCC)
16.0000 mg | Freq: Once | INTRAVENOUS | Status: AC
  Administered 2011-03-17: 16 mg via INTRAVENOUS

## 2011-03-17 MED ORDER — POTASSIUM CHLORIDE 2 MEQ/ML IV SOLN
Freq: Once | INTRAVENOUS | Status: AC
  Administered 2011-03-17: 12:00:00 via INTRAVENOUS
  Filled 2011-03-17: qty 10

## 2011-03-18 ENCOUNTER — Ambulatory Visit (HOSPITAL_BASED_OUTPATIENT_CLINIC_OR_DEPARTMENT_OTHER): Payer: 59

## 2011-03-18 VITALS — BP 111/74 | HR 59 | Temp 97.7°F

## 2011-03-18 DIAGNOSIS — C6290 Malignant neoplasm of unspecified testis, unspecified whether descended or undescended: Secondary | ICD-10-CM

## 2011-03-18 DIAGNOSIS — Z5111 Encounter for antineoplastic chemotherapy: Secondary | ICD-10-CM

## 2011-03-18 MED ORDER — SODIUM CHLORIDE 0.9 % IV SOLN
Freq: Once | INTRAVENOUS | Status: AC
  Administered 2011-03-18: 10:00:00 via INTRAVENOUS

## 2011-03-18 MED ORDER — CISPLATIN CHEMO INJECTION 100MG/100ML
20.0000 mg/m2 | Freq: Once | INTRAVENOUS | Status: AC
  Administered 2011-03-18: 41 mg via INTRAVENOUS
  Filled 2011-03-18: qty 41

## 2011-03-18 MED ORDER — DEXAMETHASONE SODIUM PHOSPHATE 4 MG/ML IJ SOLN
20.0000 mg | Freq: Once | INTRAMUSCULAR | Status: AC
  Administered 2011-03-18: 20 mg via INTRAVENOUS

## 2011-03-18 MED ORDER — ONDANSETRON 16 MG/50ML IVPB (CHCC)
16.0000 mg | Freq: Once | INTRAVENOUS | Status: AC
  Administered 2011-03-18: 16 mg via INTRAVENOUS

## 2011-03-18 MED ORDER — SODIUM CHLORIDE 0.9 % IV SOLN
100.0000 mg/m2 | Freq: Once | INTRAVENOUS | Status: AC
  Administered 2011-03-18: 200 mg via INTRAVENOUS
  Filled 2011-03-18: qty 10

## 2011-03-18 MED ORDER — POTASSIUM CHLORIDE 2 MEQ/ML IV SOLN
Freq: Once | INTRAVENOUS | Status: AC
  Administered 2011-03-18: 11:00:00 via INTRAVENOUS
  Filled 2011-03-18: qty 10

## 2011-03-18 NOTE — Progress Notes (Signed)
1015 Pt stated 3 days ago was "going outside and missed the last step" He fell and has a quarter sized skin tear and bruising to right elbow.  No other c/o from fall.

## 2011-03-19 ENCOUNTER — Ambulatory Visit: Payer: 59

## 2011-03-19 ENCOUNTER — Ambulatory Visit (HOSPITAL_BASED_OUTPATIENT_CLINIC_OR_DEPARTMENT_OTHER): Payer: 59

## 2011-03-19 VITALS — BP 128/85 | HR 76 | Temp 97.8°F

## 2011-03-19 DIAGNOSIS — C6290 Malignant neoplasm of unspecified testis, unspecified whether descended or undescended: Secondary | ICD-10-CM

## 2011-03-19 MED ORDER — PEGFILGRASTIM INJECTION 6 MG/0.6ML
6.0000 mg | Freq: Once | SUBCUTANEOUS | Status: AC
  Administered 2011-03-19: 6 mg via SUBCUTANEOUS

## 2011-03-19 NOTE — Patient Instructions (Signed)
Pt aware of future appts, no complaints.  SLJ 

## 2011-03-21 ENCOUNTER — Ambulatory Visit: Payer: 59

## 2011-03-21 ENCOUNTER — Other Ambulatory Visit (HOSPITAL_BASED_OUTPATIENT_CLINIC_OR_DEPARTMENT_OTHER): Payer: 59 | Admitting: Lab

## 2011-03-21 DIAGNOSIS — C6290 Malignant neoplasm of unspecified testis, unspecified whether descended or undescended: Secondary | ICD-10-CM

## 2011-03-21 LAB — BASIC METABOLIC PANEL
BUN: 34 mg/dL — ABNORMAL HIGH (ref 6–23)
CO2: 25 mEq/L (ref 19–32)
Glucose, Bld: 82 mg/dL (ref 70–99)
Potassium: 4.5 mEq/L (ref 3.5–5.3)
Sodium: 133 mEq/L — ABNORMAL LOW (ref 135–145)

## 2011-03-21 LAB — CBC WITH DIFFERENTIAL/PLATELET
Basophils Absolute: 0.1 10*3/uL (ref 0.0–0.1)
Eosinophils Absolute: 0 10*3/uL (ref 0.0–0.5)
HGB: 11.9 g/dL — ABNORMAL LOW (ref 13.0–17.1)
MCV: 96.1 fL (ref 79.3–98.0)
MONO#: 0 10*3/uL — ABNORMAL LOW (ref 0.1–0.9)
MONO%: 0.1 % (ref 0.0–14.0)
NEUT#: 24.6 10*3/uL — ABNORMAL HIGH (ref 1.5–6.5)
Platelets: 420 10*3/uL — ABNORMAL HIGH (ref 140–400)
RBC: 3.62 10*6/uL — ABNORMAL LOW (ref 4.20–5.82)
RDW: 20 % — ABNORMAL HIGH (ref 11.0–14.6)
WBC: 25.5 10*3/uL — ABNORMAL HIGH (ref 4.0–10.3)

## 2011-03-25 ENCOUNTER — Telehealth: Payer: Self-pay | Admitting: Oncology

## 2011-03-25 NOTE — Telephone Encounter (Signed)
Called pt, talked to father, left message regarding lab appt on 2/18 and and 04/04/11. Requested pt to come and getb calendar on 03/28/11

## 2011-03-28 ENCOUNTER — Other Ambulatory Visit: Payer: 59 | Admitting: Lab

## 2011-03-28 DIAGNOSIS — C6290 Malignant neoplasm of unspecified testis, unspecified whether descended or undescended: Secondary | ICD-10-CM

## 2011-03-28 DIAGNOSIS — D709 Neutropenia, unspecified: Secondary | ICD-10-CM

## 2011-03-28 LAB — BASIC METABOLIC PANEL
CO2: 22 mEq/L (ref 19–32)
Calcium: 9.4 mg/dL (ref 8.4–10.5)
Creatinine, Ser: 0.85 mg/dL (ref 0.50–1.35)
Sodium: 137 mEq/L (ref 135–145)

## 2011-03-28 LAB — CBC WITH DIFFERENTIAL/PLATELET
BASO%: 0.8 % (ref 0.0–2.0)
EOS%: 0.3 % (ref 0.0–7.0)
HCT: 31.5 % — ABNORMAL LOW (ref 38.4–49.9)
LYMPH%: 19.6 % (ref 14.0–49.0)
MCH: 33 pg (ref 27.2–33.4)
MCHC: 34.4 g/dL (ref 32.0–36.0)
MCV: 95.8 fL (ref 79.3–98.0)
MONO%: 3.2 % (ref 0.0–14.0)
NEUT%: 76.1 % — ABNORMAL HIGH (ref 39.0–75.0)
Platelets: 106 10*3/uL — ABNORMAL LOW (ref 140–400)
RBC: 3.29 10*6/uL — ABNORMAL LOW (ref 4.20–5.82)
WBC: 7.1 10*3/uL (ref 4.0–10.3)

## 2011-04-04 ENCOUNTER — Ambulatory Visit (HOSPITAL_BASED_OUTPATIENT_CLINIC_OR_DEPARTMENT_OTHER): Payer: 59 | Admitting: Oncology

## 2011-04-04 ENCOUNTER — Encounter: Payer: Self-pay | Admitting: Oncology

## 2011-04-04 ENCOUNTER — Other Ambulatory Visit: Payer: Self-pay

## 2011-04-04 ENCOUNTER — Other Ambulatory Visit: Payer: Self-pay | Admitting: Oncology

## 2011-04-04 ENCOUNTER — Ambulatory Visit (HOSPITAL_BASED_OUTPATIENT_CLINIC_OR_DEPARTMENT_OTHER): Payer: 59

## 2011-04-04 ENCOUNTER — Other Ambulatory Visit (HOSPITAL_BASED_OUTPATIENT_CLINIC_OR_DEPARTMENT_OTHER): Payer: 59 | Admitting: Lab

## 2011-04-04 VITALS — BP 98/68 | HR 81 | Temp 97.4°F | Ht 72.0 in | Wt 184.3 lb

## 2011-04-04 DIAGNOSIS — C772 Secondary and unspecified malignant neoplasm of intra-abdominal lymph nodes: Secondary | ICD-10-CM

## 2011-04-04 DIAGNOSIS — C6290 Malignant neoplasm of unspecified testis, unspecified whether descended or undescended: Secondary | ICD-10-CM

## 2011-04-04 DIAGNOSIS — M7989 Other specified soft tissue disorders: Secondary | ICD-10-CM

## 2011-04-04 DIAGNOSIS — I824Y9 Acute embolism and thrombosis of unspecified deep veins of unspecified proximal lower extremity: Secondary | ICD-10-CM

## 2011-04-04 DIAGNOSIS — Z5111 Encounter for antineoplastic chemotherapy: Secondary | ICD-10-CM

## 2011-04-04 LAB — COMPREHENSIVE METABOLIC PANEL
AST: 14 U/L (ref 0–37)
Albumin: 4.2 g/dL (ref 3.5–5.2)
Alkaline Phosphatase: 79 U/L (ref 39–117)
Calcium: 9.8 mg/dL (ref 8.4–10.5)
Chloride: 102 mEq/L (ref 96–112)
Glucose, Bld: 90 mg/dL (ref 70–99)
Potassium: 4.9 mEq/L (ref 3.5–5.3)
Sodium: 134 mEq/L — ABNORMAL LOW (ref 135–145)
Total Protein: 6.7 g/dL (ref 6.0–8.3)

## 2011-04-04 LAB — CBC WITH DIFFERENTIAL/PLATELET
Basophils Absolute: 0.2 10*3/uL — ABNORMAL HIGH (ref 0.0–0.1)
EOS%: 0.8 % (ref 0.0–7.0)
HCT: 33.4 % — ABNORMAL LOW (ref 38.4–49.9)
HGB: 11.3 g/dL — ABNORMAL LOW (ref 13.0–17.1)
MCH: 31.6 pg (ref 27.2–33.4)
MONO#: 1.4 10*3/uL — ABNORMAL HIGH (ref 0.1–0.9)
NEUT#: 14.5 10*3/uL — ABNORMAL HIGH (ref 1.5–6.5)
RDW: 19 % — ABNORMAL HIGH (ref 11.0–14.6)
WBC: 17.1 10*3/uL — ABNORMAL HIGH (ref 4.0–10.3)
lymph#: 0.8 10*3/uL — ABNORMAL LOW (ref 0.9–3.3)

## 2011-04-04 MED ORDER — DEXAMETHASONE SODIUM PHOSPHATE 4 MG/ML IJ SOLN
20.0000 mg | Freq: Once | INTRAMUSCULAR | Status: AC
  Administered 2011-04-04: 20 mg via INTRAVENOUS

## 2011-04-04 MED ORDER — SODIUM CHLORIDE 0.9 % IV SOLN
100.0000 mg/m2 | Freq: Once | INTRAVENOUS | Status: AC
  Administered 2011-04-04: 200 mg via INTRAVENOUS
  Filled 2011-04-04: qty 10

## 2011-04-04 MED ORDER — ONDANSETRON 16 MG/50ML IVPB (CHCC)
16.0000 mg | Freq: Once | INTRAVENOUS | Status: AC
  Administered 2011-04-04: 16 mg via INTRAVENOUS

## 2011-04-04 MED ORDER — SODIUM CHLORIDE 0.9 % IV SOLN
Freq: Once | INTRAVENOUS | Status: AC
  Administered 2011-04-04: 12:00:00 via INTRAVENOUS

## 2011-04-04 MED ORDER — POTASSIUM CHLORIDE 2 MEQ/ML IV SOLN
Freq: Once | INTRAVENOUS | Status: AC
  Administered 2011-04-04: 13:00:00 via INTRAVENOUS
  Filled 2011-04-04: qty 10

## 2011-04-04 MED ORDER — SODIUM CHLORIDE 0.9 % IV SOLN
20.0000 mg/m2 | Freq: Once | INTRAVENOUS | Status: AC
  Administered 2011-04-04: 41 mg via INTRAVENOUS
  Filled 2011-04-04: qty 41

## 2011-04-04 NOTE — Progress Notes (Signed)
This office note has been dictated.  #[id number]
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
## 2011-04-05 ENCOUNTER — Ambulatory Visit (HOSPITAL_BASED_OUTPATIENT_CLINIC_OR_DEPARTMENT_OTHER): Payer: 59

## 2011-04-05 ENCOUNTER — Other Ambulatory Visit: Payer: Self-pay

## 2011-04-05 VITALS — BP 113/74 | HR 75 | Temp 97.5°F

## 2011-04-05 DIAGNOSIS — C6290 Malignant neoplasm of unspecified testis, unspecified whether descended or undescended: Secondary | ICD-10-CM

## 2011-04-05 DIAGNOSIS — Z5111 Encounter for antineoplastic chemotherapy: Secondary | ICD-10-CM

## 2011-04-05 MED ORDER — SODIUM CHLORIDE 0.9 % IV SOLN
100.0000 mg/m2 | Freq: Once | INTRAVENOUS | Status: AC
  Administered 2011-04-05: 200 mg via INTRAVENOUS
  Filled 2011-04-05: qty 10

## 2011-04-05 MED ORDER — SODIUM CHLORIDE 0.9 % IV SOLN
20.0000 mg/m2 | Freq: Once | INTRAVENOUS | Status: AC
  Administered 2011-04-05: 41 mg via INTRAVENOUS
  Filled 2011-04-05: qty 41

## 2011-04-05 MED ORDER — DEXAMETHASONE SODIUM PHOSPHATE 4 MG/ML IJ SOLN
20.0000 mg | Freq: Once | INTRAMUSCULAR | Status: AC
  Administered 2011-04-05: 20 mg via INTRAVENOUS

## 2011-04-05 MED ORDER — SODIUM CHLORIDE 0.9 % IV SOLN
Freq: Once | INTRAVENOUS | Status: AC
  Administered 2011-04-05: 100 mL via INTRAVENOUS

## 2011-04-05 MED ORDER — ONDANSETRON 16 MG/50ML IVPB (CHCC)
16.0000 mg | Freq: Once | INTRAVENOUS | Status: AC
  Administered 2011-04-05: 16 mg via INTRAVENOUS

## 2011-04-05 MED ORDER — POTASSIUM CHLORIDE 2 MEQ/ML IV SOLN
Freq: Once | INTRAVENOUS | Status: AC
  Administered 2011-04-05: 10:00:00 via INTRAVENOUS
  Filled 2011-04-05: qty 10

## 2011-04-06 ENCOUNTER — Telehealth: Payer: Self-pay | Admitting: Oncology

## 2011-04-06 ENCOUNTER — Ambulatory Visit (HOSPITAL_BASED_OUTPATIENT_CLINIC_OR_DEPARTMENT_OTHER): Payer: 59

## 2011-04-06 VITALS — BP 125/75 | HR 69 | Temp 97.6°F

## 2011-04-06 DIAGNOSIS — Z5111 Encounter for antineoplastic chemotherapy: Secondary | ICD-10-CM

## 2011-04-06 DIAGNOSIS — C6290 Malignant neoplasm of unspecified testis, unspecified whether descended or undescended: Secondary | ICD-10-CM

## 2011-04-06 MED ORDER — POTASSIUM CHLORIDE 2 MEQ/ML IV SOLN
Freq: Once | INTRAVENOUS | Status: AC
  Administered 2011-04-06: 10:00:00 via INTRAVENOUS
  Filled 2011-04-06: qty 10

## 2011-04-06 MED ORDER — DEXAMETHASONE SODIUM PHOSPHATE 4 MG/ML IJ SOLN
20.0000 mg | Freq: Once | INTRAMUSCULAR | Status: AC
  Administered 2011-04-06: 20 mg via INTRAVENOUS

## 2011-04-06 MED ORDER — ONDANSETRON 16 MG/50ML IVPB (CHCC)
16.0000 mg | Freq: Once | INTRAVENOUS | Status: AC
  Administered 2011-04-06: 16 mg via INTRAVENOUS

## 2011-04-06 MED ORDER — FAMOTIDINE IN NACL 20-0.9 MG/50ML-% IV SOLN
20.0000 mg | Freq: Two times a day (BID) | INTRAVENOUS | Status: DC
  Administered 2011-04-06: 20 mg via INTRAVENOUS

## 2011-04-06 MED ORDER — SODIUM CHLORIDE 0.9 % IV SOLN: Freq: Once | INTRAVENOUS | Status: DC

## 2011-04-06 MED ORDER — CISPLATIN CHEMO INJECTION 100MG/100ML
20.0000 mg/m2 | Freq: Once | INTRAVENOUS | Status: AC
  Administered 2011-04-06: 41 mg via INTRAVENOUS
  Filled 2011-04-06: qty 41

## 2011-04-06 MED ORDER — SODIUM CHLORIDE 0.9 % IV SOLN
100.0000 mg/m2 | Freq: Once | INTRAVENOUS | Status: AC
  Administered 2011-04-06: 200 mg via INTRAVENOUS
  Filled 2011-04-06: qty 10

## 2011-04-06 NOTE — Telephone Encounter (Signed)
sch printed and taken to pt in tx room   aom

## 2011-04-07 ENCOUNTER — Ambulatory Visit (HOSPITAL_BASED_OUTPATIENT_CLINIC_OR_DEPARTMENT_OTHER): Payer: 59

## 2011-04-07 VITALS — BP 109/68 | HR 80 | Temp 97.2°F

## 2011-04-07 DIAGNOSIS — Z5111 Encounter for antineoplastic chemotherapy: Secondary | ICD-10-CM

## 2011-04-07 DIAGNOSIS — C6290 Malignant neoplasm of unspecified testis, unspecified whether descended or undescended: Secondary | ICD-10-CM

## 2011-04-07 MED ORDER — POTASSIUM CHLORIDE 2 MEQ/ML IV SOLN
Freq: Once | INTRAVENOUS | Status: AC
  Administered 2011-04-07: 11:00:00 via INTRAVENOUS
  Filled 2011-04-07: qty 10

## 2011-04-07 MED ORDER — SODIUM CHLORIDE 0.9 % IV SOLN
20.0000 mg/m2 | Freq: Once | INTRAVENOUS | Status: AC
  Administered 2011-04-07: 41 mg via INTRAVENOUS
  Filled 2011-04-07: qty 41

## 2011-04-07 MED ORDER — SODIUM CHLORIDE 0.9 % IV SOLN: Freq: Once | INTRAVENOUS | Status: DC

## 2011-04-07 MED ORDER — DEXAMETHASONE SODIUM PHOSPHATE 4 MG/ML IJ SOLN
20.0000 mg | Freq: Once | INTRAMUSCULAR | Status: AC
  Administered 2011-04-07: 20 mg via INTRAVENOUS

## 2011-04-07 MED ORDER — SODIUM CHLORIDE 0.9 % IV SOLN
100.0000 mg/m2 | Freq: Once | INTRAVENOUS | Status: AC
  Administered 2011-04-07: 200 mg via INTRAVENOUS
  Filled 2011-04-07: qty 10

## 2011-04-07 MED ORDER — ONDANSETRON 16 MG/50ML IVPB (CHCC)
16.0000 mg | Freq: Once | INTRAVENOUS | Status: AC
  Administered 2011-04-07: 16 mg via INTRAVENOUS

## 2011-04-07 NOTE — Progress Notes (Signed)
Pt had 800 cc urine output pre-cisplatin.Pt voided 500 after cisplatin

## 2011-04-08 ENCOUNTER — Telehealth: Payer: Self-pay | Admitting: Medical Oncology

## 2011-04-08 ENCOUNTER — Telehealth: Payer: Self-pay | Admitting: Oncology

## 2011-04-08 ENCOUNTER — Ambulatory Visit (HOSPITAL_BASED_OUTPATIENT_CLINIC_OR_DEPARTMENT_OTHER): Payer: 59

## 2011-04-08 ENCOUNTER — Ambulatory Visit: Payer: 59

## 2011-04-08 VITALS — BP 130/87 | HR 64 | Temp 97.6°F

## 2011-04-08 DIAGNOSIS — C6290 Malignant neoplasm of unspecified testis, unspecified whether descended or undescended: Secondary | ICD-10-CM

## 2011-04-08 DIAGNOSIS — Z5189 Encounter for other specified aftercare: Secondary | ICD-10-CM

## 2011-04-08 MED ORDER — PEGFILGRASTIM INJECTION 6 MG/0.6ML
6.0000 mg | Freq: Once | SUBCUTANEOUS | Status: AC
  Administered 2011-04-08: 6 mg via SUBCUTANEOUS
  Filled 2011-04-08: qty 0.6

## 2011-04-08 NOTE — Telephone Encounter (Signed)
Moved pts 3/18 md appt time from 4;30 to 9:00 and new sch given to pt in tx room  aom

## 2011-04-08 NOTE — Telephone Encounter (Signed)
Pt was here this am to get his injection. He stated he did not have his follow up appt with Dr. Arline Asp for March. I called pt to let him know we do have him scheduled for the 18th. I asked him to get a new schedule when he comes for labs on 04/11/11.

## 2011-04-09 ENCOUNTER — Ambulatory Visit: Payer: 59

## 2011-04-11 ENCOUNTER — Other Ambulatory Visit (HOSPITAL_BASED_OUTPATIENT_CLINIC_OR_DEPARTMENT_OTHER): Payer: 59 | Admitting: Lab

## 2011-04-11 ENCOUNTER — Telehealth: Payer: Self-pay | Admitting: Medical Oncology

## 2011-04-11 DIAGNOSIS — C6290 Malignant neoplasm of unspecified testis, unspecified whether descended or undescended: Secondary | ICD-10-CM

## 2011-04-11 LAB — CBC WITH DIFFERENTIAL/PLATELET
BASO%: 0.1 % (ref 0.0–2.0)
EOS%: 0 % (ref 0.0–7.0)
HCT: 35.2 % — ABNORMAL LOW (ref 38.4–49.9)
LYMPH%: 2.5 % — ABNORMAL LOW (ref 14.0–49.0)
MCH: 32.9 pg (ref 27.2–33.4)
MCHC: 34 g/dL (ref 32.0–36.0)
MONO%: 0.3 % (ref 0.0–14.0)
NEUT%: 97.1 % — ABNORMAL HIGH (ref 39.0–75.0)
lymph#: 0.6 10*3/uL — ABNORMAL LOW (ref 0.9–3.3)

## 2011-04-11 LAB — BASIC METABOLIC PANEL
Calcium: 9.5 mg/dL (ref 8.4–10.5)
Chloride: 102 mEq/L (ref 96–112)
Creatinine, Ser: 0.9 mg/dL (ref 0.50–1.35)

## 2011-04-11 LAB — MAGNESIUM: Magnesium: 1.7 mg/dL (ref 1.5–2.5)

## 2011-04-11 NOTE — Telephone Encounter (Signed)
I called pt to verify the dose of potassium he is taking. He is taking 20 meq daily. Per Dr. Arline Asp have pt take 20 meq every other day. Pt voiced understanding.

## 2011-04-18 ENCOUNTER — Other Ambulatory Visit (HOSPITAL_BASED_OUTPATIENT_CLINIC_OR_DEPARTMENT_OTHER): Payer: 59 | Admitting: Lab

## 2011-04-18 DIAGNOSIS — C6290 Malignant neoplasm of unspecified testis, unspecified whether descended or undescended: Secondary | ICD-10-CM

## 2011-04-18 LAB — CBC WITH DIFFERENTIAL/PLATELET
BASO%: 0.6 % (ref 0.0–2.0)
Eosinophils Absolute: 0 10*3/uL (ref 0.0–0.5)
MONO#: 0.4 10*3/uL (ref 0.1–0.9)
NEUT#: 7.1 10*3/uL — ABNORMAL HIGH (ref 1.5–6.5)
RBC: 3.12 10*6/uL — ABNORMAL LOW (ref 4.20–5.82)
RDW: 22.4 % — ABNORMAL HIGH (ref 11.0–14.6)
WBC: 8.7 10*3/uL (ref 4.0–10.3)
lymph#: 1.1 10*3/uL (ref 0.9–3.3)

## 2011-04-18 LAB — BASIC METABOLIC PANEL
CO2: 24 mEq/L (ref 19–32)
Glucose, Bld: 85 mg/dL (ref 70–99)
Potassium: 3.9 mEq/L (ref 3.5–5.3)
Sodium: 140 mEq/L (ref 135–145)

## 2011-04-19 ENCOUNTER — Other Ambulatory Visit: Payer: Self-pay | Admitting: Oncology

## 2011-04-19 ENCOUNTER — Other Ambulatory Visit: Payer: Self-pay | Admitting: Medical Oncology

## 2011-04-20 ENCOUNTER — Ambulatory Visit (HOSPITAL_COMMUNITY)
Admission: RE | Admit: 2011-04-20 | Discharge: 2011-04-20 | Disposition: A | Discharge status: Home / Self Care | Payer: 59 | Source: Ambulatory Visit | Attending: Oncology | Admitting: Oncology

## 2011-04-20 ENCOUNTER — Other Ambulatory Visit (HOSPITAL_COMMUNITY): Payer: 59

## 2011-04-20 ENCOUNTER — Encounter (HOSPITAL_COMMUNITY): Payer: Self-pay

## 2011-04-20 ENCOUNTER — Other Ambulatory Visit: Payer: Self-pay | Admitting: Oncology

## 2011-04-20 DIAGNOSIS — M47814 Spondylosis without myelopathy or radiculopathy, thoracic region: Secondary | ICD-10-CM | POA: Insufficient documentation

## 2011-04-20 DIAGNOSIS — I2510 Atherosclerotic heart disease of native coronary artery without angina pectoris: Secondary | ICD-10-CM | POA: Insufficient documentation

## 2011-04-20 DIAGNOSIS — M47817 Spondylosis without myelopathy or radiculopathy, lumbosacral region: Secondary | ICD-10-CM | POA: Insufficient documentation

## 2011-04-20 DIAGNOSIS — I700 Atherosclerosis of aorta: Secondary | ICD-10-CM | POA: Insufficient documentation

## 2011-04-20 DIAGNOSIS — C6290 Malignant neoplasm of unspecified testis, unspecified whether descended or undescended: Secondary | ICD-10-CM

## 2011-04-20 DIAGNOSIS — R599 Enlarged lymph nodes, unspecified: Secondary | ICD-10-CM | POA: Insufficient documentation

## 2011-04-20 MED ORDER — IOHEXOL 300 MG/ML  SOLN
100.0000 mL | Freq: Once | INTRAMUSCULAR | Status: AC | PRN
  Administered 2011-04-20: 100 mL via INTRAVENOUS

## 2011-04-25 ENCOUNTER — Other Ambulatory Visit (HOSPITAL_BASED_OUTPATIENT_CLINIC_OR_DEPARTMENT_OTHER): Payer: 59 | Admitting: Lab

## 2011-04-25 ENCOUNTER — Ambulatory Visit (HOSPITAL_BASED_OUTPATIENT_CLINIC_OR_DEPARTMENT_OTHER): Payer: 59

## 2011-04-25 ENCOUNTER — Ambulatory Visit (HOSPITAL_BASED_OUTPATIENT_CLINIC_OR_DEPARTMENT_OTHER): Payer: 59 | Admitting: Oncology

## 2011-04-25 ENCOUNTER — Telehealth: Payer: Self-pay | Admitting: Oncology

## 2011-04-25 ENCOUNTER — Encounter: Payer: Self-pay | Admitting: Oncology

## 2011-04-25 VITALS — BP 98/66 | HR 64 | Temp 97.1°F | Ht 72.0 in | Wt 187.9 lb

## 2011-04-25 DIAGNOSIS — C6290 Malignant neoplasm of unspecified testis, unspecified whether descended or undescended: Secondary | ICD-10-CM

## 2011-04-25 DIAGNOSIS — I82890 Acute embolism and thrombosis of other specified veins: Secondary | ICD-10-CM

## 2011-04-25 DIAGNOSIS — Z5111 Encounter for antineoplastic chemotherapy: Secondary | ICD-10-CM

## 2011-04-25 LAB — CBC WITH DIFFERENTIAL/PLATELET
BASO%: 1.4 % (ref 0.0–2.0)
Eosinophils Absolute: 0.4 10*3/uL (ref 0.0–0.5)
HCT: 32.7 % — ABNORMAL LOW (ref 38.4–49.9)
LYMPH%: 11.7 % — ABNORMAL LOW (ref 14.0–49.0)
MCHC: 33.3 g/dL (ref 32.0–36.0)
MONO#: 1.3 10*3/uL — ABNORMAL HIGH (ref 0.1–0.9)
NEUT#: 7.1 10*3/uL — ABNORMAL HIGH (ref 1.5–6.5)
NEUT%: 70.7 % (ref 39.0–75.0)
Platelets: 300 10*3/uL (ref 140–400)
WBC: 10.1 10*3/uL (ref 4.0–10.3)
lymph#: 1.2 10*3/uL (ref 0.9–3.3)
nRBC: 0 % (ref 0–0)

## 2011-04-25 LAB — COMPREHENSIVE METABOLIC PANEL
ALT: 11 U/L (ref 0–53)
AST: 16 U/L (ref 0–37)
Albumin: 4.7 g/dL (ref 3.5–5.2)
BUN: 17 mg/dL (ref 6–23)
CO2: 23 mEq/L (ref 19–32)
Calcium: 10.1 mg/dL (ref 8.4–10.5)
Chloride: 102 mEq/L (ref 96–112)
Potassium: 5.1 mEq/L (ref 3.5–5.3)

## 2011-04-25 LAB — LACTATE DEHYDROGENASE: LDH: 157 U/L (ref 94–250)

## 2011-04-25 MED ORDER — DEXTROSE-NACL 5-0.45 % IV SOLN
Freq: Once | INTRAVENOUS | Status: AC
  Administered 2011-04-25: 12:00:00 via INTRAVENOUS
  Filled 2011-04-25: qty 10

## 2011-04-25 MED ORDER — SODIUM CHLORIDE 0.9 % IV SOLN
20.0000 mg/m2 | Freq: Once | INTRAVENOUS | Status: AC
  Administered 2011-04-25: 41 mg via INTRAVENOUS
  Filled 2011-04-25: qty 41

## 2011-04-25 MED ORDER — DEXAMETHASONE SODIUM PHOSPHATE 4 MG/ML IJ SOLN
20.0000 mg | Freq: Once | INTRAMUSCULAR | Status: AC
  Administered 2011-04-25: 20 mg via INTRAVENOUS

## 2011-04-25 MED ORDER — SODIUM CHLORIDE 0.9 % IV SOLN
100.0000 mg/m2 | Freq: Once | INTRAVENOUS | Status: AC
  Administered 2011-04-25: 200 mg via INTRAVENOUS
  Filled 2011-04-25: qty 10

## 2011-04-25 MED ORDER — ONDANSETRON 16 MG/50ML IVPB (CHCC)
16.0000 mg | Freq: Once | INTRAVENOUS | Status: AC
Start: 2011-04-25 — End: 2011-04-25
  Administered 2011-04-25: 16 mg via INTRAVENOUS

## 2011-04-25 NOTE — Progress Notes (Signed)
This office note has been dictated.  #[id number]
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
## 2011-04-25 NOTE — Telephone Encounter (Signed)
gv pt appts for march-april2013.  scheduled appt with Dr. Retta Diones for 04/21 sent fax referral to [facility] to send notes to there office. scheduled pet scan for 04/03 @ [facility].

## 2011-04-25 NOTE — Patient Instructions (Signed)
[facility] Discharge Instructions for Patients Receiving Chemotherapy  Today you received the following chemotherapy agents  CISPLATIN , VP-16 To help prevent nausea and vomiting after your treatment, we encourage you to take your nausea medication  Begin taking it  as often as prescribed    If you develop nausea and vomiting that is not controlled by your nausea medication, call the clinic. If it is after clinic hours your family physician or the after hours number for the clinic or go to the Emergency Department.   BELOW ARE SYMPTOMS THAT SHOULD BE REPORTED IMMEDIATELY:  *FEVER GREATER THAN 100.5 F  *CHILLS WITH OR WITHOUT FEVER  NAUSEA AND VOMITING THAT IS NOT CONTROLLED WITH YOUR NAUSEA MEDICATION  *UNUSUAL SHORTNESS OF BREATH  *UNUSUAL BRUISING OR BLEEDING  TENDERNESS IN MOUTH AND THROAT WITH OR WITHOUT PRESENCE OF ULCERS  *URINARY PROBLEMS  *BOWEL PROBLEMS  UNUSUAL RASH Items with * indicate a potential emergency and should be followed up as soon as possible.  One of the nurses will contact you 24 hours after your treatment. Please let the nurse know about any problems that you may have experienced. Feel free to call the clinic you have any questions or concerns. The clinic phone number is [phone].   I have been informed and understand all the instructions given to me. I know to contact the clinic, my physician, or go to the Emergency Department if any problems should occur. I do not have any questions at this time, but understand that I may call the clinic during office hours   should I have any questions or need assistance in obtaining follow up care.    __________________________________________  _____________  __________ Signature of Patient or Authorized Representative            Date                   Time    __________________________________________ Nurse's Signature

## 2011-04-26 ENCOUNTER — Ambulatory Visit (HOSPITAL_BASED_OUTPATIENT_CLINIC_OR_DEPARTMENT_OTHER): Payer: 59

## 2011-04-26 VITALS — BP 109/68 | HR 71 | Temp 97.9°F

## 2011-04-26 DIAGNOSIS — C6290 Malignant neoplasm of unspecified testis, unspecified whether descended or undescended: Secondary | ICD-10-CM

## 2011-04-26 DIAGNOSIS — Z5111 Encounter for antineoplastic chemotherapy: Secondary | ICD-10-CM

## 2011-04-26 DIAGNOSIS — C772 Secondary and unspecified malignant neoplasm of intra-abdominal lymph nodes: Secondary | ICD-10-CM

## 2011-04-26 MED ORDER — SODIUM CHLORIDE 0.9 % IV SOLN
100.0000 mg/m2 | Freq: Once | INTRAVENOUS | Status: AC
  Administered 2011-04-26: 200 mg via INTRAVENOUS
  Filled 2011-04-26: qty 10

## 2011-04-26 MED ORDER — SODIUM CHLORIDE 0.9 % IV SOLN
20.0000 mg/m2 | Freq: Once | INTRAVENOUS | Status: AC
  Administered 2011-04-26: 41 mg via INTRAVENOUS
  Filled 2011-04-26: qty 41

## 2011-04-26 MED ORDER — SODIUM CHLORIDE 0.9 % IV SOLN
Freq: Once | INTRAVENOUS | Status: AC
  Administered 2011-04-26: 10:00:00 via INTRAVENOUS

## 2011-04-26 MED ORDER — ONDANSETRON 16 MG/50ML IVPB (CHCC): 16.0000 mg | Freq: Once | INTRAVENOUS | Status: DC

## 2011-04-26 MED ORDER — DEXAMETHASONE SODIUM PHOSPHATE 4 MG/ML IJ SOLN: 20.0000 mg | Freq: Once | INTRAMUSCULAR | Status: DC

## 2011-04-26 MED ORDER — POTASSIUM CHLORIDE 2 MEQ/ML IV SOLN
Freq: Once | INTRAVENOUS | Status: DC
  Filled 2011-04-26: qty 10

## 2011-04-27 ENCOUNTER — Ambulatory Visit (HOSPITAL_BASED_OUTPATIENT_CLINIC_OR_DEPARTMENT_OTHER): Payer: 59

## 2011-04-27 VITALS — BP 122/79 | HR 75 | Temp 97.2°F

## 2011-04-27 DIAGNOSIS — C6290 Malignant neoplasm of unspecified testis, unspecified whether descended or undescended: Secondary | ICD-10-CM

## 2011-04-27 DIAGNOSIS — Z5111 Encounter for antineoplastic chemotherapy: Secondary | ICD-10-CM

## 2011-04-27 MED ORDER — SODIUM CHLORIDE 0.9 % IV SOLN
Freq: Once | INTRAVENOUS | Status: AC
  Administered 2011-04-27: 09:00:00 via INTRAVENOUS

## 2011-04-27 MED ORDER — ONDANSETRON 16 MG/50ML IVPB (CHCC)
16.0000 mg | Freq: Once | INTRAVENOUS | Status: AC
  Administered 2011-04-27: 16 mg via INTRAVENOUS

## 2011-04-27 MED ORDER — POTASSIUM CHLORIDE 2 MEQ/ML IV SOLN
Freq: Once | INTRAVENOUS | Status: AC
  Administered 2011-04-27: 09:00:00 via INTRAVENOUS
  Filled 2011-04-27: qty 10

## 2011-04-27 MED ORDER — SODIUM CHLORIDE 0.9 % IV SOLN
100.0000 mg/m2 | Freq: Once | INTRAVENOUS | Status: AC
  Administered 2011-04-27: 200 mg via INTRAVENOUS
  Filled 2011-04-27: qty 10

## 2011-04-27 MED ORDER — SODIUM CHLORIDE 0.9 % IV SOLN
20.0000 mg/m2 | Freq: Once | INTRAVENOUS | Status: AC
  Administered 2011-04-27: 41 mg via INTRAVENOUS
  Filled 2011-04-27: qty 41

## 2011-04-27 MED ORDER — DEXAMETHASONE SODIUM PHOSPHATE 4 MG/ML IJ SOLN
20.0000 mg | Freq: Once | INTRAMUSCULAR | Status: AC
  Administered 2011-04-27: 20 mg via INTRAVENOUS

## 2011-04-27 NOTE — Progress Notes (Signed)
Pre-hydration of 375cc, post-hydration of 1000cc.

## 2011-04-28 ENCOUNTER — Ambulatory Visit (HOSPITAL_BASED_OUTPATIENT_CLINIC_OR_DEPARTMENT_OTHER): Payer: 59

## 2011-04-28 VITALS — BP 108/66 | HR 55 | Temp 97.2°F

## 2011-04-28 DIAGNOSIS — Z5111 Encounter for antineoplastic chemotherapy: Secondary | ICD-10-CM

## 2011-04-28 DIAGNOSIS — C6290 Malignant neoplasm of unspecified testis, unspecified whether descended or undescended: Secondary | ICD-10-CM

## 2011-04-28 MED ORDER — SODIUM CHLORIDE 0.9 % IV SOLN
Freq: Once | INTRAVENOUS | Status: AC
  Administered 2011-04-28: 11:00:00 via INTRAVENOUS

## 2011-04-28 MED ORDER — SODIUM CHLORIDE 0.9 % IV SOLN
100.0000 mg/m2 | Freq: Once | INTRAVENOUS | Status: AC
  Administered 2011-04-28: 200 mg via INTRAVENOUS
  Filled 2011-04-28: qty 10

## 2011-04-28 MED ORDER — CISPLATIN CHEMO INJECTION 100MG/100ML
20.0000 mg/m2 | Freq: Once | INTRAVENOUS | Status: AC
  Administered 2011-04-28: 41 mg via INTRAVENOUS
  Filled 2011-04-28: qty 41

## 2011-04-28 MED ORDER — DEXAMETHASONE SODIUM PHOSPHATE 4 MG/ML IJ SOLN
20.0000 mg | Freq: Once | INTRAMUSCULAR | Status: AC
  Administered 2011-04-28: 20 mg via INTRAVENOUS

## 2011-04-28 MED ORDER — ONDANSETRON 16 MG/50ML IVPB (CHCC)
16.0000 mg | Freq: Once | INTRAVENOUS | Status: AC
  Administered 2011-04-28: 16 mg via INTRAVENOUS

## 2011-04-28 MED ORDER — POTASSIUM CHLORIDE 2 MEQ/ML IV SOLN
Freq: Once | INTRAVENOUS | Status: AC
  Administered 2011-04-28: 10:00:00 via INTRAVENOUS
  Filled 2011-04-28: qty 10

## 2011-04-28 NOTE — Progress Notes (Signed)
Pre Cisplatin pt voided . Post cisplatin pt voided .

## 2011-04-28 NOTE — Patient Instructions (Signed)
[facility] Discharge Instructions for Patients Receiving Chemotherapy  Today you received the following chemotherapy agents: Cisplatin & Etoposide  To help prevent nausea and vomiting after your treatment, we encourage you to take your nausea medication:  Compazine 10 mg by mouth every 6 hours as needed or 25 mg suppository every 6 hours as needed. Begin taking it at 1st sign of nausea and take it as often as prescribed.  Continue to push po fluids   If you develop nausea and vomiting that is not controlled by your nausea medication, call the clinic. If it is after clinic hours your family physician or the after hours number for the clinic or go to the Emergency Department.   BELOW ARE SYMPTOMS THAT SHOULD BE REPORTED IMMEDIATELY:  *FEVER GREATER THAN 100.5 F  *CHILLS WITH OR WITHOUT FEVER  NAUSEA AND VOMITING THAT IS NOT CONTROLLED WITH YOUR NAUSEA MEDICATION  *UNUSUAL SHORTNESS OF BREATH  *UNUSUAL BRUISING OR BLEEDING  TENDERNESS IN MOUTH AND THROAT WITH OR WITHOUT PRESENCE OF ULCERS  *URINARY PROBLEMS  *BOWEL PROBLEMS  UNUSUAL RASH Items with * indicate a potential emergency and should be followed up as soon as possible.   Feel free to call the clinic you have any questions or concerns. The clinic phone number is [phone].   I have been informed and understand all the instructions given to me. I know to contact the clinic, my physician, or go to the Emergency Department if any problems should occur. I do not have any questions at this time, but understand that I may call the clinic during office hours   should I have any questions or need assistance in obtaining follow up care.    __________________________________________  _____________  __________ Signature of Patient or Authorized Representative            Date                   Time    __________________________________________ Nurse's Signature

## 2011-04-29 ENCOUNTER — Ambulatory Visit (HOSPITAL_BASED_OUTPATIENT_CLINIC_OR_DEPARTMENT_OTHER): Payer: 59

## 2011-04-29 VITALS — BP 110/66 | HR 63 | Temp 95.9°F

## 2011-04-29 DIAGNOSIS — C6290 Malignant neoplasm of unspecified testis, unspecified whether descended or undescended: Secondary | ICD-10-CM

## 2011-04-29 DIAGNOSIS — Z5189 Encounter for other specified aftercare: Secondary | ICD-10-CM

## 2011-04-29 MED ORDER — PEGFILGRASTIM INJECTION 6 MG/0.6ML
6.0000 mg | Freq: Once | SUBCUTANEOUS | Status: AC
  Administered 2011-04-29: 6 mg via SUBCUTANEOUS
  Filled 2011-04-29: qty 0.6

## 2011-05-02 ENCOUNTER — Other Ambulatory Visit (HOSPITAL_BASED_OUTPATIENT_CLINIC_OR_DEPARTMENT_OTHER): Payer: 59 | Admitting: Lab

## 2011-05-02 DIAGNOSIS — C6290 Malignant neoplasm of unspecified testis, unspecified whether descended or undescended: Secondary | ICD-10-CM

## 2011-05-02 LAB — CBC WITH DIFFERENTIAL/PLATELET
BASO%: 0.1 % (ref 0.0–2.0)
EOS%: 0.1 % (ref 0.0–7.0)
LYMPH%: 3.3 % — ABNORMAL LOW (ref 14.0–49.0)
MCHC: 33.8 g/dL (ref 32.0–36.0)
MCV: 100.1 fL — ABNORMAL HIGH (ref 79.3–98.0)
MONO%: 0.2 % (ref 0.0–14.0)
Platelets: 195 10*3/uL (ref 140–400)
RBC: 3.35 10*6/uL — ABNORMAL LOW (ref 4.20–5.82)
RDW: 21.3 % — ABNORMAL HIGH (ref 11.0–14.6)

## 2011-05-09 ENCOUNTER — Other Ambulatory Visit: Payer: Self-pay | Admitting: Medical Oncology

## 2011-05-09 ENCOUNTER — Telehealth: Payer: Self-pay | Admitting: Medical Oncology

## 2011-05-09 ENCOUNTER — Telehealth: Payer: Self-pay | Admitting: Oncology

## 2011-05-09 ENCOUNTER — Other Ambulatory Visit (HOSPITAL_BASED_OUTPATIENT_CLINIC_OR_DEPARTMENT_OTHER): Payer: 59 | Admitting: Lab

## 2011-05-09 DIAGNOSIS — C6290 Malignant neoplasm of unspecified testis, unspecified whether descended or undescended: Secondary | ICD-10-CM

## 2011-05-09 LAB — CBC WITH DIFFERENTIAL/PLATELET
BASO%: 0.4 % (ref 0.0–2.0)
LYMPH%: 17.3 % (ref 14.0–49.0)
MCHC: 33.9 g/dL (ref 32.0–36.0)
MONO#: 0.7 10*3/uL (ref 0.1–0.9)
NEUT#: 4.4 10*3/uL (ref 1.5–6.5)
RBC: 2.95 10*6/uL — ABNORMAL LOW (ref 4.20–5.82)
RDW: 19.9 % — ABNORMAL HIGH (ref 11.0–14.6)
WBC: 6.2 10*3/uL (ref 4.0–10.3)
lymph#: 1.1 10*3/uL (ref 0.9–3.3)
nRBC: 0 % (ref 0–0)

## 2011-05-09 LAB — BASIC METABOLIC PANEL
Chloride: 105 mEq/L (ref 96–112)
Potassium: 3.9 mEq/L (ref 3.5–5.3)
Sodium: 138 mEq/L (ref 135–145)

## 2011-05-09 LAB — MAGNESIUM: Magnesium: 1.6 mg/dL (ref 1.5–2.5)

## 2011-05-09 NOTE — Telephone Encounter (Signed)
I called pt to let him know that his platelets are low 26. Per Dr. Arline Asp we wiil recheck his CBC 05/11/11

## 2011-05-09 NOTE — Telephone Encounter (Signed)
pt aware of 4/3 lab after pet

## 2011-05-10 ENCOUNTER — Other Ambulatory Visit: Payer: Self-pay | Admitting: *Deleted

## 2011-05-10 ENCOUNTER — Other Ambulatory Visit (HOSPITAL_COMMUNITY): Payer: Self-pay | Admitting: Oncology

## 2011-05-10 DIAGNOSIS — C6290 Malignant neoplasm of unspecified testis, unspecified whether descended or undescended: Secondary | ICD-10-CM

## 2011-05-10 MED ORDER — PREDNISONE 10 MG PO TABS: 10.0000 mg | ORAL_TABLET | ORAL | Status: DC

## 2011-05-10 NOTE — Telephone Encounter (Signed)
Request for prednisone 20 mg daily denied and refill done for 10mg  every other day sent by EMR

## 2011-05-11 ENCOUNTER — Encounter (HOSPITAL_COMMUNITY)
Admission: RE | Admit: 2011-05-11 | Discharge: 2011-05-11 | Disposition: A | Discharge status: Home / Self Care | Payer: 59 | Source: Ambulatory Visit | Attending: Oncology | Admitting: Oncology

## 2011-05-11 ENCOUNTER — Other Ambulatory Visit (HOSPITAL_BASED_OUTPATIENT_CLINIC_OR_DEPARTMENT_OTHER): Payer: 59 | Admitting: Lab

## 2011-05-11 DIAGNOSIS — C6290 Malignant neoplasm of unspecified testis, unspecified whether descended or undescended: Secondary | ICD-10-CM

## 2011-05-11 DIAGNOSIS — R599 Enlarged lymph nodes, unspecified: Secondary | ICD-10-CM | POA: Insufficient documentation

## 2011-05-11 LAB — CBC WITH DIFFERENTIAL/PLATELET
Basophils Absolute: 0 10*3/uL (ref 0.0–0.1)
Eosinophils Absolute: 0.1 10*3/uL (ref 0.0–0.5)
HCT: 31.8 % — ABNORMAL LOW (ref 38.4–49.9)
HGB: 10.6 g/dL — ABNORMAL LOW (ref 13.0–17.1)
MONO#: 0.9 10*3/uL (ref 0.1–0.9)
NEUT%: 75 % (ref 39.0–75.0)
WBC: 8.6 10*3/uL (ref 4.0–10.3)
lymph#: 1.1 10*3/uL (ref 0.9–3.3)

## 2011-05-11 MED ORDER — FLUDEOXYGLUCOSE F - 18 (FDG) INJECTION
18.1000 | Freq: Once | INTRAVENOUS | Status: AC | PRN
  Administered 2011-05-11: 18.1 via INTRAVENOUS

## 2011-05-16 ENCOUNTER — Encounter: Payer: Self-pay | Admitting: Oncology

## 2011-05-16 ENCOUNTER — Ambulatory Visit (HOSPITAL_BASED_OUTPATIENT_CLINIC_OR_DEPARTMENT_OTHER): Payer: 59 | Admitting: Oncology

## 2011-05-16 ENCOUNTER — Ambulatory Visit: Payer: 59

## 2011-05-16 ENCOUNTER — Other Ambulatory Visit: Payer: 59 | Admitting: Lab

## 2011-05-16 VITALS — BP 106/73 | HR 73 | Temp 97.1°F | Ht 72.0 in | Wt 187.1 lb

## 2011-05-16 DIAGNOSIS — C6290 Malignant neoplasm of unspecified testis, unspecified whether descended or undescended: Secondary | ICD-10-CM

## 2011-05-16 LAB — CBC WITH DIFFERENTIAL/PLATELET
Eosinophils Absolute: 0.1 10*3/uL (ref 0.0–0.5)
HCT: 30.1 % — ABNORMAL LOW (ref 38.4–49.9)
HGB: 10.1 g/dL — ABNORMAL LOW (ref 13.0–17.1)
LYMPH%: 11.6 % — ABNORMAL LOW (ref 14.0–49.0)
MONO#: 0.9 10*3/uL (ref 0.1–0.9)
NEUT#: 5.7 10*3/uL (ref 1.5–6.5)
Platelets: 144 10*3/uL (ref 140–400)
RBC: 2.97 10*6/uL — ABNORMAL LOW (ref 4.20–5.82)
WBC: 7.6 10*3/uL (ref 4.0–10.3)
nRBC: 0 % (ref 0–0)

## 2011-05-16 LAB — COMPREHENSIVE METABOLIC PANEL
ALT: <8 U/L (ref 0–53)
CO2: 27 mEq/L (ref 19–32)
Calcium: 9.4 mg/dL (ref 8.4–10.5)
Chloride: 105 mEq/L (ref 96–112)
Creatinine, Ser: 1.05 mg/dL (ref 0.50–1.35)
Total Protein: 5.9 g/dL — ABNORMAL LOW (ref 6.0–8.3)

## 2011-05-16 LAB — LACTATE DEHYDROGENASE: LDH: 98 U/L (ref 94–250)
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
## 2011-05-16 NOTE — Progress Notes (Signed)
This office note has been dictated.  #[id number]

## 2011-05-17 ENCOUNTER — Ambulatory Visit: Payer: 59

## 2011-05-18 ENCOUNTER — Ambulatory Visit: Payer: 59

## 2011-05-19 ENCOUNTER — Ambulatory Visit: Payer: 59

## 2011-05-20 ENCOUNTER — Ambulatory Visit: Payer: 59

## 2011-05-21 ENCOUNTER — Ambulatory Visit: Payer: 59

## 2011-05-25 ENCOUNTER — Encounter: Payer: Self-pay | Admitting: Medical Oncology

## 2011-05-25 NOTE — Progress Notes (Signed)
Pt came by the office to inform Dr. Arline Asp that he saw Dr. Carolyne Fiscal at [facility] today. He is scheduled to have surgery 06/23/11. He will be going back to [facility] 06/01/11 and Dr. Carolyne Fiscal asked the pt to bring a CD of all his scans. I called radiology and they will prepare for pt and he will pick these up 05/31/11. Pt also states that Dr. Carolyne Fiscal told him he can stop his levenox injections. Dr. Arline Asp notified.

## 2011-06-07 ENCOUNTER — Encounter: Payer: Self-pay | Admitting: Medical Oncology

## 2011-06-07 NOTE — Progress Notes (Signed)
Per Dr. Mamie Levers request I faxed pt's AFP,LDH, HCG to Dr. Carolyne Fiscal [phone]

## 2011-07-06 ENCOUNTER — Other Ambulatory Visit: Payer: Self-pay | Admitting: Medical Oncology

## 2011-07-06 ENCOUNTER — Telehealth: Payer: Self-pay | Admitting: Medical Oncology

## 2011-07-06 ENCOUNTER — Encounter: Payer: Self-pay | Admitting: Medical Oncology

## 2011-07-06 ENCOUNTER — Encounter: Payer: Self-pay | Admitting: Oncology

## 2011-07-06 DIAGNOSIS — D696 Thrombocytopenia, unspecified: Secondary | ICD-10-CM

## 2011-07-06 DIAGNOSIS — C6290 Malignant neoplasm of unspecified testis, unspecified whether descended or undescended: Secondary | ICD-10-CM

## 2011-07-06 NOTE — Telephone Encounter (Signed)
I called pt (is staying with his parents due to recent surgery) to let him know that  Dr. Arline Asp received the discharge note from Dr. Carolyne Fiscal and he is concerned about his low HCT. Dr. Arline Asp would like to check some labs tomorrow to make sure he does not need a transfusion. Pt is fine with this and will come in the am at 1000 am. I asked them to check about his results before they leave just in case he needs blood. She voiced understanding.

## 2011-07-06 NOTE — Progress Notes (Signed)
I called Dr. Hinton Dyer office and left a message requesting operative note and pathology reports from his surgery 06/23/11. I asked them to fax to our office and call if any problems.

## 2011-07-06 NOTE — Progress Notes (Signed)
We received a discharge summary from [facility]. The patient was admitted from 06/23/2011 through 07/04/2011. On 06/23/2011 he underwent a right inguinal radical orchiectomy and retroperitoneal lymphadenectomy. The discharge summary indicates that the right testicular mass showed necrotic tumor. The large majority of nodes were fibrotic without any residual nodal tissue. At this point we do not have the pathology or operative report.  The postoperative course was complicated by acute blood loss anemia, postoperative ileus and chylous ascites. Estimated blood loss was 2800 cc. The patient received 5 units of packed red cells. At the time of discharge his hematocrit apparently was 24. He is scheduled to see Dr. Carlyle Dolly on 07/13/2011.  We will plan to check CBC and chemistries on 07/07/2011. The patient may require additional blood transfusions. We will try to obtain the operative note and pathology report.

## 2011-07-07 ENCOUNTER — Encounter: Payer: Self-pay | Admitting: Oncology

## 2011-07-07 ENCOUNTER — Other Ambulatory Visit (HOSPITAL_BASED_OUTPATIENT_CLINIC_OR_DEPARTMENT_OTHER): Payer: 59 | Admitting: Lab

## 2011-07-07 DIAGNOSIS — C6290 Malignant neoplasm of unspecified testis, unspecified whether descended or undescended: Secondary | ICD-10-CM

## 2011-07-07 DIAGNOSIS — D696 Thrombocytopenia, unspecified: Secondary | ICD-10-CM

## 2011-07-07 LAB — CBC WITH DIFFERENTIAL/PLATELET
BASO%: 0.4 % (ref 0.0–2.0)
EOS%: 4.7 % (ref 0.0–7.0)
MCH: 30.5 pg (ref 27.2–33.4)
MCHC: 32.8 g/dL (ref 32.0–36.0)
MCV: 93.1 fL (ref 79.3–98.0)
MONO%: 5.9 % (ref 0.0–14.0)
NEUT#: 4.7 10*3/uL (ref 1.5–6.5)
RBC: 2.93 10*6/uL — ABNORMAL LOW (ref 4.20–5.82)
RDW: 18.2 % — ABNORMAL HIGH (ref 11.0–14.6)
nRBC: 0 % (ref 0–0)

## 2011-07-07 LAB — COMPREHENSIVE METABOLIC PANEL
ALT: 14 U/L (ref 0–53)
Alkaline Phosphatase: 64 U/L (ref 39–117)
Sodium: 138 mEq/L (ref 135–145)
Total Bilirubin: 0.2 mg/dL — ABNORMAL LOW (ref 0.3–1.2)
Total Protein: 5.6 g/dL — ABNORMAL LOW (ref 6.0–8.3)

## 2011-07-07 NOTE — Progress Notes (Signed)
We received notes from [facility] for this patient's hospitalization from 06/23/2011 through 07/04/2011, during which time he underwent definitive surgery for his right testicular seminoma.  Surgery consisted of a right inguinal radical orchiectomy and retroperitoneal lymphadenectomy. We have a copy of the operative and pathology reports which will be submitted for scanning. The pathology report showed no evidence for viable tumor in any of the resected specimens. The right testicle showed extensive necrosis and fibrosis. Extensive changes were consistent with treated tumor.  It would appear to me that this patient will not require any additional treatments. I will plan to confirm this with his doctors at [facility]. I believe the patient has an appointment to see Dr. Carolyne Fiscal late next week.

## 2011-07-18 ENCOUNTER — Encounter: Payer: Self-pay | Admitting: Oncology

## 2011-07-18 NOTE — Progress Notes (Signed)
We received an office note from Dr. Carlyle Dolly from [facility], dated 07/13/2011. The patient is on low molecular weight heparin following his retroperitoneal lymph node dissection.  It was suggested that the patient have tumor markers and CT scans of the chest abdomen and pelvis every 6 months. It is not clear to me whether these studies will be carried out here in [place] or at [facility].  I may be inclined to obtain baseline studies in the next 2-3 months and then every 4-6 months thereafter.

## 2011-07-22 ENCOUNTER — Telehealth: Payer: Self-pay

## 2011-07-22 ENCOUNTER — Other Ambulatory Visit: Payer: Self-pay

## 2011-07-22 DIAGNOSIS — C6290 Malignant neoplasm of unspecified testis, unspecified whether descended or undescended: Secondary | ICD-10-CM

## 2011-07-22 NOTE — Telephone Encounter (Signed)
Pt asking if he can stop his lovenox. He is taking no other medications at this time. He also has a c/o tingling in his feet. Started after surgery by Dr Carolyne Fiscal at [facility]. Started in L foot and now also in R foot, will have to get up in the night from feet tingling. Pt states Dr Carolyne Fiscal said he would know nothing about it, and Dr Su Hilt suggested he call DSM to see if it was related to his chemo in December.

## 2011-07-22 NOTE — Telephone Encounter (Signed)
S/w pt that DSM wants him to continue his lovenox. We will be calling him to schedule an appt next month. DSM said the tingling can be a delayed reaction to the chemotherapy. Pt expressed understanding. pts parents phone [phone].

## 2011-07-27 ENCOUNTER — Telehealth: Payer: Self-pay | Admitting: *Deleted

## 2011-07-27 NOTE — Telephone Encounter (Signed)
Left voice message to inform the patient of the new date and time 

## 2011-08-08 ENCOUNTER — Telehealth: Payer: Self-pay | Admitting: Oncology

## 2011-08-08 NOTE — Telephone Encounter (Signed)
pt called into cx 7/30 appt,stated he initiated the appt and now wishes to cx it    aom

## 2011-09-06 ENCOUNTER — Ambulatory Visit: Payer: 59 | Admitting: Oncology

## 2011-09-06 ENCOUNTER — Other Ambulatory Visit: Payer: 59 | Admitting: Lab

## 2011-11-28 ENCOUNTER — Telehealth: Payer: Self-pay

## 2011-11-28 NOTE — Telephone Encounter (Signed)
S/w pt per Dr Arline Asp request to find out how pt is doing. Pt is under care of his PCP and will see the surgeon from [facility] (Dr Carolyne Fiscal) in Dec. He states his energy is much improved. He is not currently on any blood thinners.  He initiated the visit here for 7/30 for feet tingling. His PCP started him on vitamin C and that is helping, therefore he cancelled the 7/30 appt.

## 2011-11-29 ENCOUNTER — Other Ambulatory Visit: Payer: Self-pay

## 2011-11-29 ENCOUNTER — Telehealth: Payer: Self-pay

## 2011-11-29 NOTE — Telephone Encounter (Signed)
Attempt to call pt to explain that Dr Arline Asp wants to see the patient to monitor his progress.

## 2011-11-29 NOTE — Telephone Encounter (Signed)
S/w pt that Dr Arline Asp wishes to keep seeing patient to monitor his condition. To expect a call from scheduler with date and time. Pt agreeable. POF to scheduler

## 2011-11-30 ENCOUNTER — Telehealth: Payer: Self-pay | Admitting: Oncology

## 2011-11-30 NOTE — Telephone Encounter (Signed)
per pof from 10/22 r/s missed appt,no answer,made appt and mailed to pt   aom

## 2011-12-05 ENCOUNTER — Telehealth: Payer: Self-pay | Admitting: Oncology

## 2011-12-05 NOTE — Telephone Encounter (Signed)
Pt lmonvm stating he had an appt that he cx'd but then received a letter from [place] stating that DM wanted him to come in for appt. Per pt please call him re appt. Returned pt's call to confirm 11/5 appt but was not able to reach him or lm. Also pt's schedule for 11/5 was previously mailed due to pt could not be contacted via phone at that time.

## 2011-12-12 ENCOUNTER — Other Ambulatory Visit: Payer: Self-pay | Admitting: Medical Oncology

## 2011-12-13 ENCOUNTER — Encounter: Payer: Self-pay | Admitting: Oncology

## 2011-12-13 ENCOUNTER — Telehealth: Payer: Self-pay | Admitting: Oncology

## 2011-12-13 ENCOUNTER — Ambulatory Visit (HOSPITAL_BASED_OUTPATIENT_CLINIC_OR_DEPARTMENT_OTHER): Payer: 59 | Admitting: Oncology

## 2011-12-13 ENCOUNTER — Other Ambulatory Visit (HOSPITAL_BASED_OUTPATIENT_CLINIC_OR_DEPARTMENT_OTHER): Payer: 59

## 2011-12-13 VITALS — BP 153/92 | HR 70 | Temp 97.6°F | Resp 20 | Ht 72.0 in | Wt 185.6 lb

## 2011-12-13 DIAGNOSIS — C6290 Malignant neoplasm of unspecified testis, unspecified whether descended or undescended: Secondary | ICD-10-CM

## 2011-12-13 LAB — CBC WITH DIFFERENTIAL/PLATELET
BASO%: 0.7 % (ref 0.0–2.0)
Basophils Absolute: 0 10*3/uL (ref 0.0–0.1)
EOS%: 12.8 % — ABNORMAL HIGH (ref 0.0–7.0)
HCT: 43.4 % (ref 38.4–49.9)
LYMPH%: 15 % (ref 14.0–49.0)
MCH: 33.5 pg — ABNORMAL HIGH (ref 27.2–33.4)
MCHC: 34 g/dL (ref 32.0–36.0)
MONO#: 0.4 10*3/uL (ref 0.1–0.9)
NEUT%: 64 % (ref 39.0–75.0)
Platelets: 204 10*3/uL (ref 140–400)

## 2011-12-13 LAB — COMPREHENSIVE METABOLIC PANEL (CC13)
Albumin: 4.2 g/dL (ref 3.5–5.0)
Alkaline Phosphatase: 100 U/L (ref 40–150)
BUN: 16 mg/dL (ref 7.0–26.0)
CO2: 25 mEq/L (ref 22–29)
Calcium: 9.6 mg/dL (ref 8.4–10.4)
Glucose: 90 mg/dl (ref 70–99)
Potassium: 4 mEq/L (ref 3.5–5.1)

## 2011-12-13 LAB — LACTATE DEHYDROGENASE (CC13): LDH: 104 U/L — ABNORMAL LOW (ref 125–220)

## 2011-12-13 NOTE — Progress Notes (Signed)
This office note has been dictated.  #[id number]

## 2011-12-13 NOTE — Patient Instructions (Signed)
PET scan next week.  Your cancer could return.  You need to return here for oncology-specific follow-up which will involve special blood tests and scans.

## 2011-12-13 NOTE — Telephone Encounter (Signed)
Gave pt appt for March 2014 lab and MD , PET SCan next week
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
## 2011-12-20 ENCOUNTER — Telehealth: Payer: Self-pay | Admitting: Medical Oncology

## 2011-12-20 ENCOUNTER — Encounter (HOSPITAL_COMMUNITY): Payer: Self-pay

## 2011-12-20 ENCOUNTER — Encounter (HOSPITAL_COMMUNITY)
Admission: RE | Admit: 2011-12-20 | Discharge: 2011-12-20 | Disposition: A | Discharge status: Home / Self Care | Payer: 59 | Source: Ambulatory Visit | Attending: Oncology | Admitting: Oncology

## 2011-12-20 DIAGNOSIS — N400 Enlarged prostate without lower urinary tract symptoms: Secondary | ICD-10-CM | POA: Insufficient documentation

## 2011-12-20 DIAGNOSIS — C6290 Malignant neoplasm of unspecified testis, unspecified whether descended or undescended: Secondary | ICD-10-CM | POA: Insufficient documentation

## 2011-12-20 DIAGNOSIS — Z923 Personal history of irradiation: Secondary | ICD-10-CM | POA: Insufficient documentation

## 2011-12-20 DIAGNOSIS — Z9079 Acquired absence of other genital organ(s): Secondary | ICD-10-CM | POA: Insufficient documentation

## 2011-12-20 DIAGNOSIS — I2510 Atherosclerotic heart disease of native coronary artery without angina pectoris: Secondary | ICD-10-CM | POA: Insufficient documentation

## 2011-12-20 DIAGNOSIS — Z9221 Personal history of antineoplastic chemotherapy: Secondary | ICD-10-CM | POA: Insufficient documentation

## 2011-12-20 LAB — GLUCOSE, CAPILLARY: Glucose-Capillary: 92 mg/dL (ref 70–99)

## 2011-12-20 MED ORDER — FLUDEOXYGLUCOSE F - 18 (FDG) INJECTION
20.1000 | Freq: Once | INTRAVENOUS | Status: AC | PRN
  Administered 2011-12-20: 20.1 via INTRAVENOUS

## 2011-12-20 NOTE — Progress Notes (Signed)
Quick Note:  Please notify patient and call/fax these results to patient's doctors. ______ 

## 2011-12-20 NOTE — Telephone Encounter (Signed)
I called pt with PET results per Dr. Mamie Levers request

## 2012-03-30 ENCOUNTER — Telehealth: Payer: Self-pay | Admitting: *Deleted

## 2012-03-30 NOTE — Telephone Encounter (Signed)
I called and gave the patient new appts for 3/4.  JMW

## 2012-04-02 ENCOUNTER — Telehealth: Payer: Self-pay | Admitting: Oncology

## 2012-04-02 NOTE — Telephone Encounter (Signed)
S/W THE PT AND HE IS AWARE OF HIS 04/10/2012 APPTS

## 2012-04-10 ENCOUNTER — Encounter: Payer: Self-pay | Admitting: Medical Oncology

## 2012-04-10 ENCOUNTER — Other Ambulatory Visit: Payer: 59 | Admitting: Lab

## 2012-04-10 ENCOUNTER — Telehealth: Payer: Self-pay | Admitting: Oncology

## 2012-04-10 ENCOUNTER — Other Ambulatory Visit (HOSPITAL_BASED_OUTPATIENT_CLINIC_OR_DEPARTMENT_OTHER): Payer: 59 | Admitting: Lab

## 2012-04-10 ENCOUNTER — Ambulatory Visit: Payer: 59 | Admitting: Oncology

## 2012-04-10 ENCOUNTER — Ambulatory Visit (HOSPITAL_BASED_OUTPATIENT_CLINIC_OR_DEPARTMENT_OTHER): Payer: 59 | Admitting: Physician Assistant

## 2012-04-10 VITALS — BP 132/89 | HR 62 | Temp 97.0°F | Resp 20 | Ht 72.0 in | Wt 194.7 lb

## 2012-04-10 DIAGNOSIS — C6290 Malignant neoplasm of unspecified testis, unspecified whether descended or undescended: Secondary | ICD-10-CM

## 2012-04-10 DIAGNOSIS — D696 Thrombocytopenia, unspecified: Secondary | ICD-10-CM

## 2012-04-10 DIAGNOSIS — C6291 Malignant neoplasm of right testis, unspecified whether descended or undescended: Secondary | ICD-10-CM

## 2012-04-10 DIAGNOSIS — I82409 Acute embolism and thrombosis of unspecified deep veins of unspecified lower extremity: Secondary | ICD-10-CM

## 2012-04-10 LAB — CBC WITH DIFFERENTIAL/PLATELET
BASO%: 1.2 % (ref 0.0–2.0)
EOS%: 16.1 % — ABNORMAL HIGH (ref 0.0–7.0)
HCT: 43.2 % (ref 38.4–49.9)
MCH: 33.6 pg — ABNORMAL HIGH (ref 27.2–33.4)
MCHC: 34.5 g/dL (ref 32.0–36.0)
MONO#: 0.5 10*3/uL (ref 0.1–0.9)
RBC: 4.44 10*6/uL (ref 4.20–5.82)
RDW: 13.8 % (ref 11.0–14.6)
WBC: 4.1 10*3/uL (ref 4.0–10.3)
lymph#: 0.7 10*3/uL — ABNORMAL LOW (ref 0.9–3.3)

## 2012-04-10 LAB — COMPREHENSIVE METABOLIC PANEL
ALT: 10 U/L (ref 0–53)
Albumin: 3.9 g/dL (ref 3.5–5.2)
BUN: 16 mg/dL (ref 6–23)
CO2: 25 mEq/L (ref 19–32)
Calcium: 9.4 mg/dL (ref 8.4–10.5)
Chloride: 103 mEq/L (ref 96–112)
Creatinine, Ser: 1.19 mg/dL (ref 0.50–1.35)

## 2012-04-10 LAB — LACTATE DEHYDROGENASE: LDH: 127 U/L (ref 94–250)

## 2012-04-10 NOTE — Patient Instructions (Signed)
Follow up  Around last week of June 2014 with dr. Arline Asp with labs.
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
## 2012-04-10 NOTE — Telephone Encounter (Signed)
Gave pt and appt for lab and MD on June 2014

## 2012-04-13 LAB — AFP TUMOR MARKER: AFP-Tumor Marker: 3.5 ng/mL (ref 0.0–8.0)

## 2012-04-13 LAB — BETA HCG QUANT (REF LAB): Beta hCG, Tumor Marker: <0.5 m[IU]/mL (ref <5.0)

## 2012-07-05 ENCOUNTER — Other Ambulatory Visit: Payer: Self-pay | Admitting: Oncology

## 2012-07-13 ENCOUNTER — Telehealth: Payer: Self-pay

## 2012-07-13 NOTE — Telephone Encounter (Signed)
Pt called to confirm appt date and time.

## 2012-08-03 ENCOUNTER — Encounter: Payer: Self-pay | Admitting: Oncology

## 2012-08-03 ENCOUNTER — Ambulatory Visit (HOSPITAL_BASED_OUTPATIENT_CLINIC_OR_DEPARTMENT_OTHER): Payer: 59 | Admitting: Oncology

## 2012-08-03 ENCOUNTER — Other Ambulatory Visit (HOSPITAL_BASED_OUTPATIENT_CLINIC_OR_DEPARTMENT_OTHER): Payer: 59 | Admitting: Lab

## 2012-08-03 ENCOUNTER — Ambulatory Visit (HOSPITAL_COMMUNITY)
Admission: RE | Admit: 2012-08-03 | Discharge: 2012-08-03 | Disposition: A | Discharge status: Home / Self Care | Payer: 59 | Source: Ambulatory Visit | Attending: Oncology | Admitting: Oncology

## 2012-08-03 ENCOUNTER — Telehealth: Payer: Self-pay

## 2012-08-03 ENCOUNTER — Telehealth: Payer: Self-pay | Admitting: Oncology

## 2012-08-03 VITALS — BP 125/81 | HR 65 | Temp 97.0°F | Resp 20 | Ht 72.0 in | Wt 193.1 lb

## 2012-08-03 DIAGNOSIS — F17200 Nicotine dependence, unspecified, uncomplicated: Secondary | ICD-10-CM | POA: Insufficient documentation

## 2012-08-03 DIAGNOSIS — C6290 Malignant neoplasm of unspecified testis, unspecified whether descended or undescended: Secondary | ICD-10-CM

## 2012-08-03 DIAGNOSIS — C6291 Malignant neoplasm of right testis, unspecified whether descended or undescended: Secondary | ICD-10-CM

## 2012-08-03 LAB — CBC WITH DIFFERENTIAL/PLATELET
BASO%: 1.2 % (ref 0.0–2.0)
Basophils Absolute: 0.1 10*3/uL (ref 0.0–0.1)
EOS%: 13.1 % — ABNORMAL HIGH (ref 0.0–7.0)
HGB: 15.5 g/dL (ref 13.0–17.1)
MCH: 34.1 pg — ABNORMAL HIGH (ref 27.2–33.4)
NEUT#: 3.9 10*3/uL (ref 1.5–6.5)
RBC: 4.56 10*6/uL (ref 4.20–5.82)
RDW: 14.1 % (ref 11.0–14.6)
lymph#: 1.1 10*3/uL (ref 0.9–3.3)

## 2012-08-03 LAB — COMPREHENSIVE METABOLIC PANEL (CC13)
ALT: 15 U/L (ref 0–55)
AST: 17 U/L (ref 5–34)
Albumin: 3.8 g/dL (ref 3.5–5.0)
BUN: 17.2 mg/dL (ref 7.0–26.0)
Calcium: 9.5 mg/dL (ref 8.4–10.4)
Chloride: 106 mEq/L (ref 98–109)
Potassium: 4.3 mEq/L (ref 3.5–5.1)
Sodium: 138 mEq/L (ref 136–145)
Total Protein: 7 g/dL (ref 6.4–8.3)

## 2012-08-03 NOTE — Telephone Encounter (Signed)
error 

## 2012-08-03 NOTE — Progress Notes (Signed)
This office note has been dictated.  #[id number]

## 2012-08-03 NOTE — Telephone Encounter (Signed)
gv and printed appt sched and avs for pt  °

## 2012-08-03 NOTE — Progress Notes (Signed)
Quick Note:  Please notify patient and call/fax these results to patient's doctors. ______ 

## 2012-08-03 NOTE — Telephone Encounter (Signed)
Call with results of CXR- clear

## 2012-08-04 IMAGING — CR DG CHEST 2V
2 series · 2 of 2 positions shown · non-contrast
Comparison: 02/02/2011

CLINICAL DATA: Pneumonia

CHEST - 2 VIEW

[w chest pa]
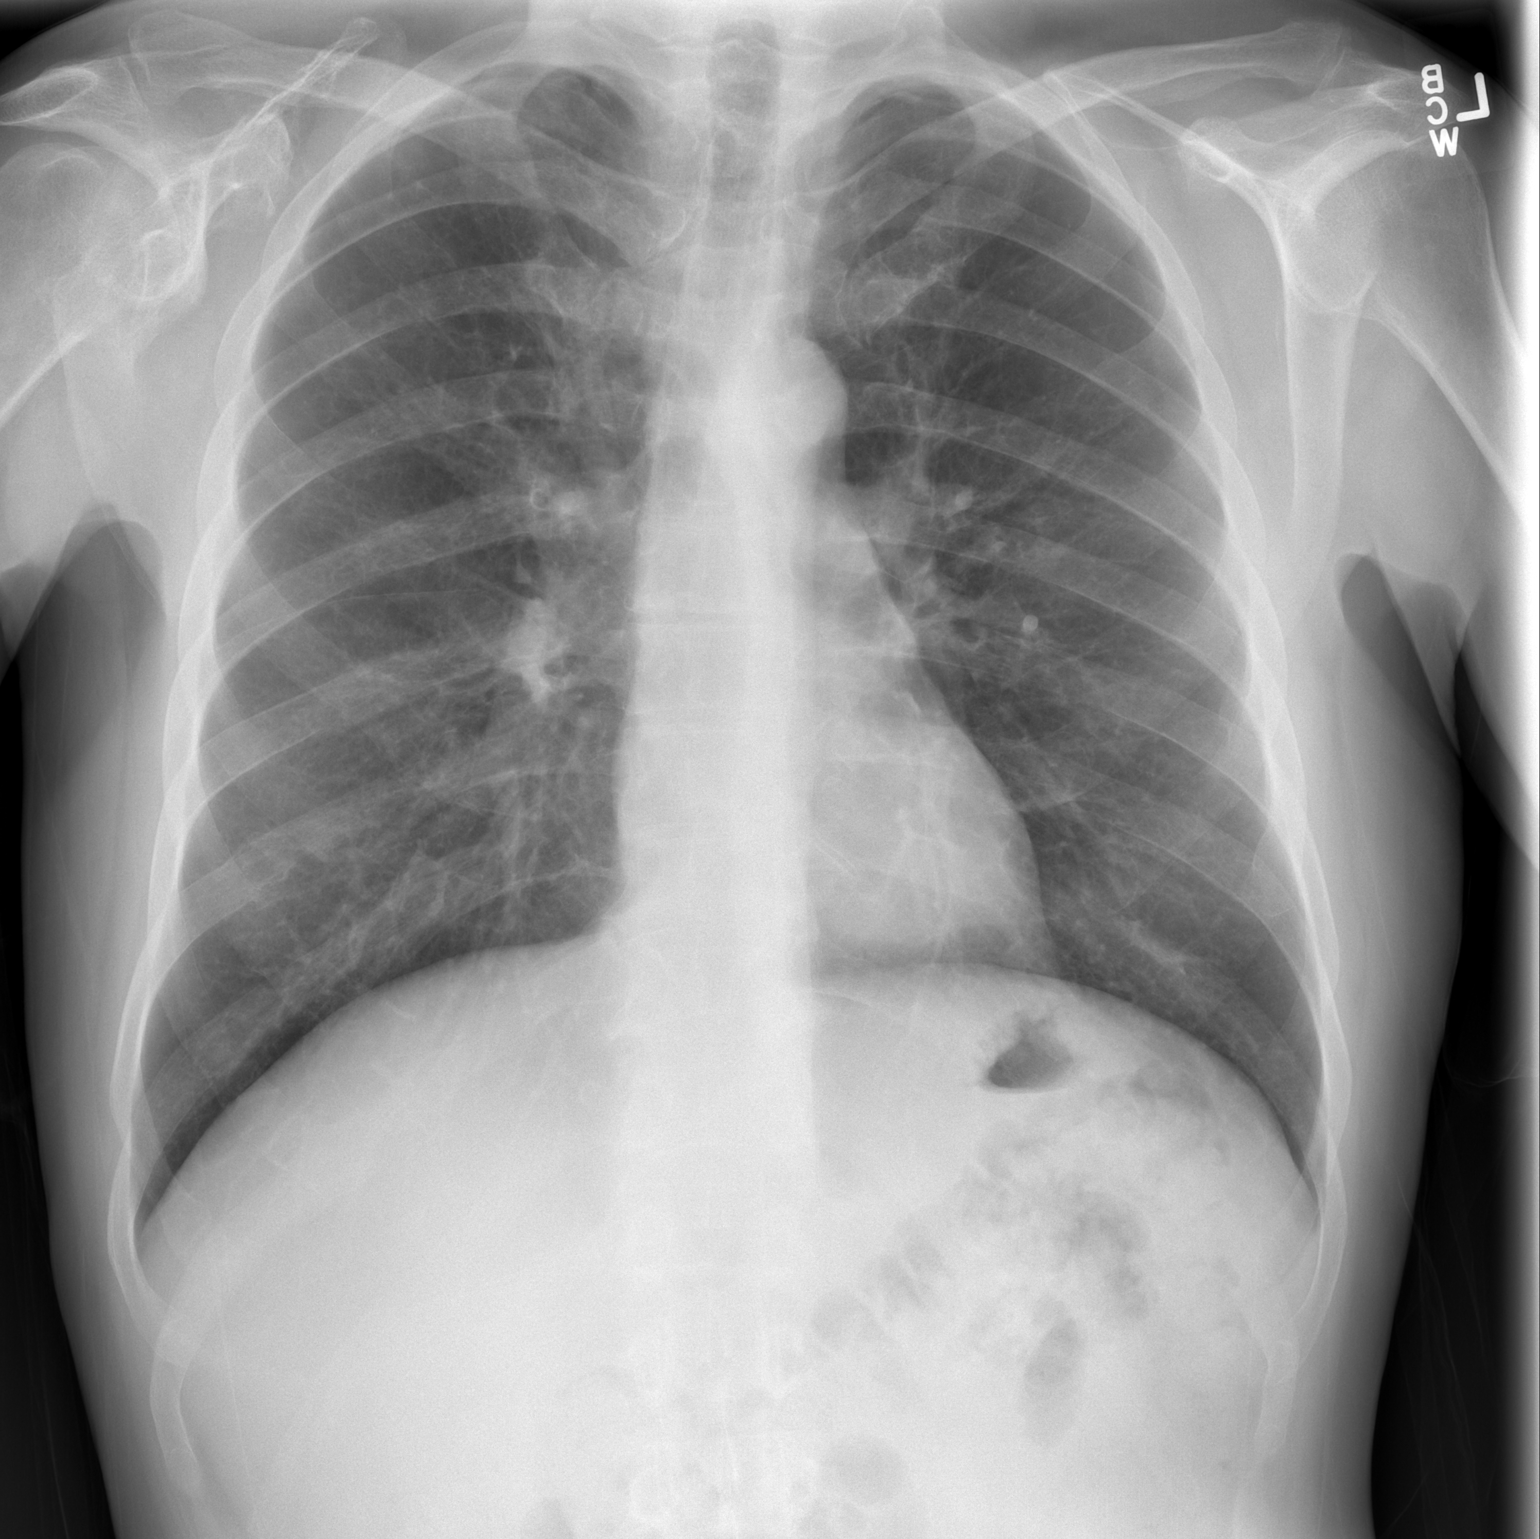

[w chest lat]
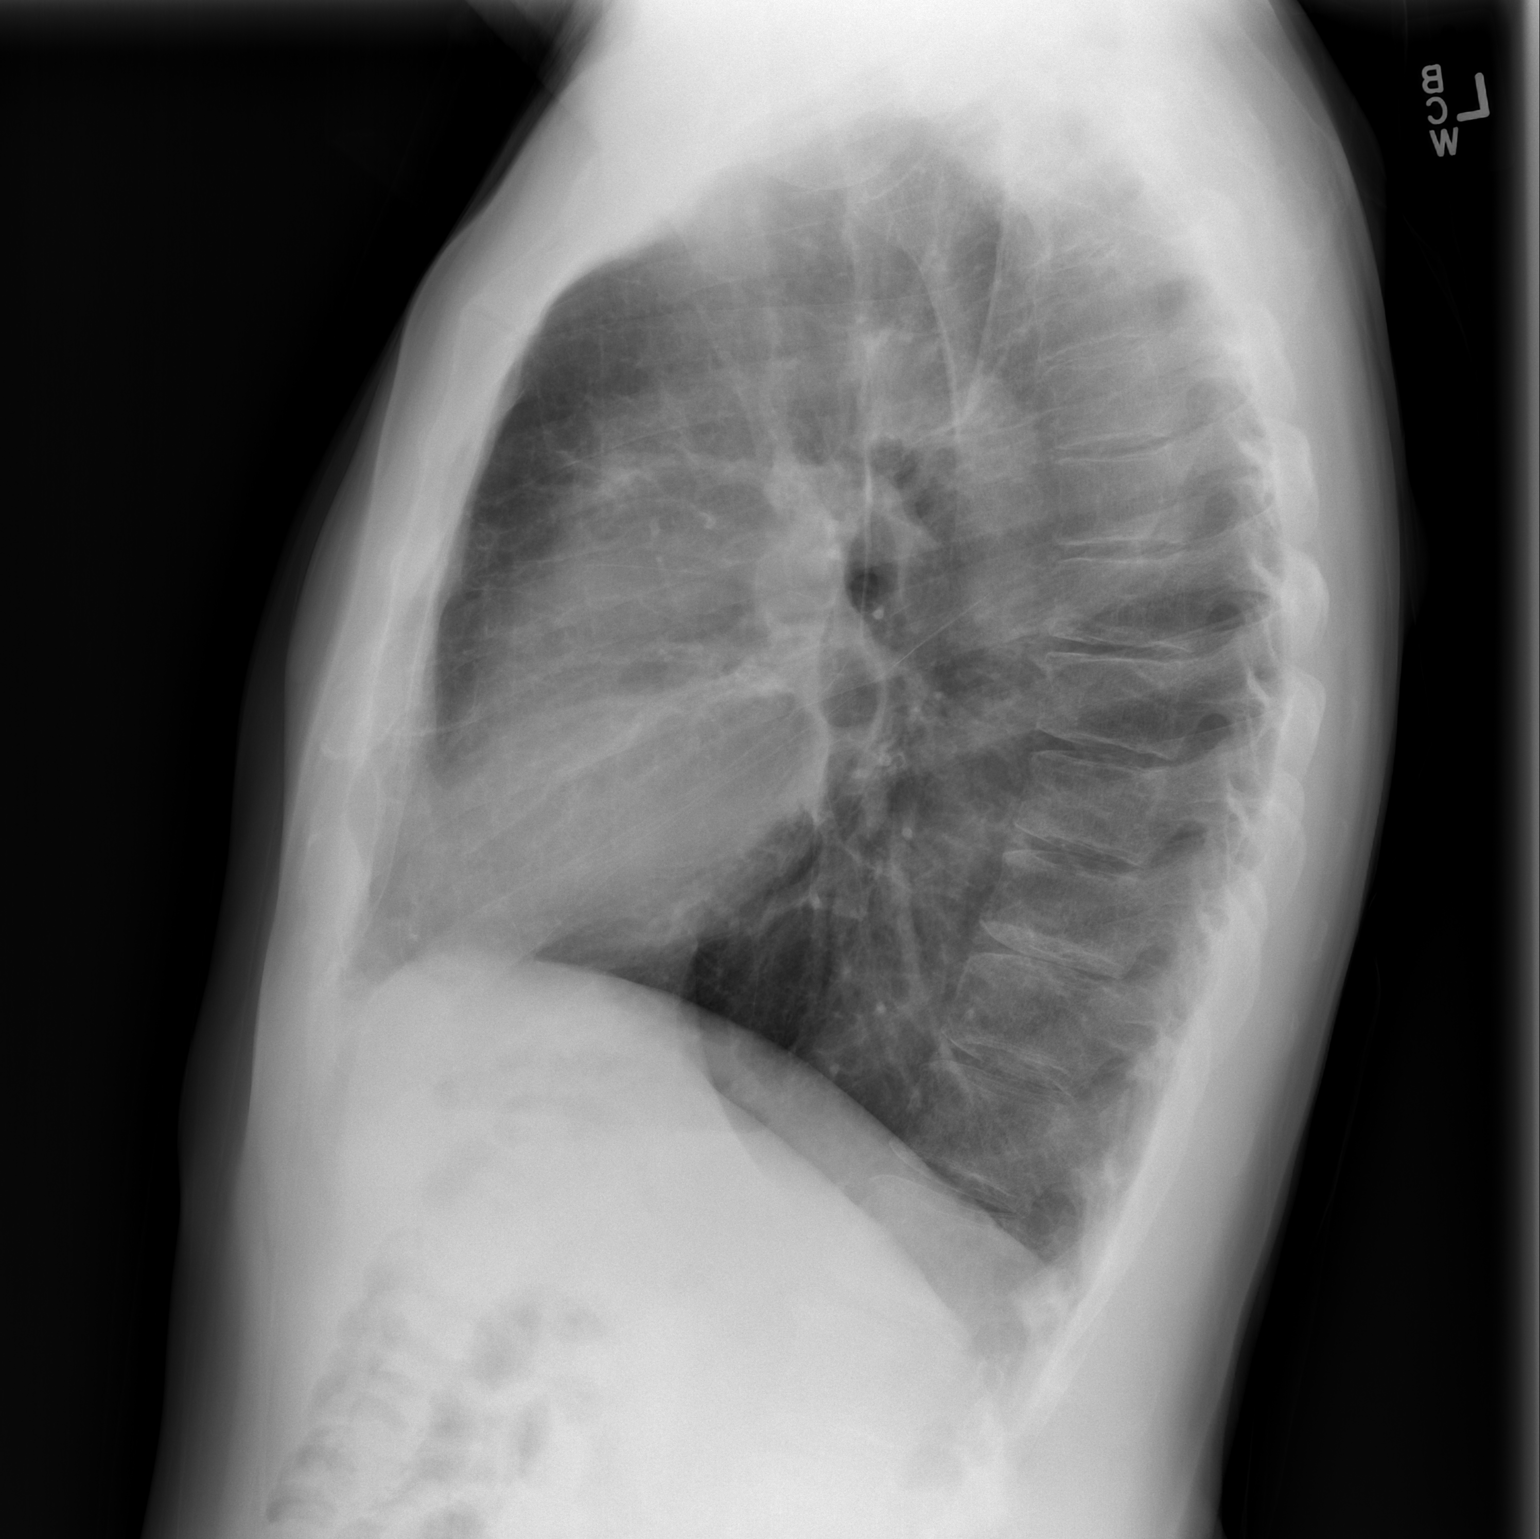

[2 of 2 positions shown; findings below may reference images not displayed]

FINDINGS: The patchy bilateral airspace opacities seen previously
have improved substantially and result in some places.  There is
some minimal residual parenchymal density at both lung bases.
There is no pleural effusion or dense focal airspace consolidation.
The cardiopericardial silhouette is within normal limits for size.
Interstitial markings are diffusely coarsened with chronic
features. Imaged bony structures of the thorax are intact.
IMPRESSION: Continued improvement of the bilateral patchy airspace opacities
with only minimal basilar density seen today.

## 2012-08-06 ENCOUNTER — Telehealth: Payer: Self-pay | Admitting: Medical Oncology
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
## 2012-08-06 NOTE — Telephone Encounter (Signed)
I called pt with chest xray results from 08/03/12.

## 2012-08-07 ENCOUNTER — Encounter (HOSPITAL_COMMUNITY): Payer: Self-pay

## 2012-08-07 ENCOUNTER — Ambulatory Visit (HOSPITAL_COMMUNITY)
Admission: RE | Admit: 2012-08-07 | Discharge: 2012-08-07 | Disposition: A | Discharge status: Home / Self Care | Payer: 59 | Source: Ambulatory Visit | Attending: Oncology | Admitting: Oncology

## 2012-08-07 ENCOUNTER — Telehealth: Payer: Self-pay

## 2012-08-07 DIAGNOSIS — C6290 Malignant neoplasm of unspecified testis, unspecified whether descended or undescended: Secondary | ICD-10-CM | POA: Insufficient documentation

## 2012-08-07 DIAGNOSIS — I7090 Unspecified atherosclerosis: Secondary | ICD-10-CM | POA: Insufficient documentation

## 2012-08-07 DIAGNOSIS — N269 Renal sclerosis, unspecified: Secondary | ICD-10-CM | POA: Insufficient documentation

## 2012-08-07 DIAGNOSIS — C6291 Malignant neoplasm of right testis, unspecified whether descended or undescended: Secondary | ICD-10-CM

## 2012-08-07 DIAGNOSIS — I2510 Atherosclerotic heart disease of native coronary artery without angina pectoris: Secondary | ICD-10-CM | POA: Insufficient documentation

## 2012-08-07 DIAGNOSIS — Z9221 Personal history of antineoplastic chemotherapy: Secondary | ICD-10-CM | POA: Insufficient documentation

## 2012-08-07 MED ORDER — IOHEXOL 300 MG/ML  SOLN
100.0000 mL | Freq: Once | INTRAMUSCULAR | Status: AC | PRN
  Administered 2012-08-07: 100 mL via INTRAVENOUS

## 2012-08-08 ENCOUNTER — Telehealth: Payer: Self-pay | Admitting: Medical Oncology

## 2012-08-08 NOTE — Telephone Encounter (Signed)
CT results from 08/07/12 faxed to Dr. Zenaida Deed.

## 2012-08-09 ENCOUNTER — Telehealth: Payer: Self-pay | Admitting: Medical Oncology

## 2012-08-09 NOTE — Telephone Encounter (Signed)
I called pt with results of his CT of abdomen and pelvis. I explained it was negative for cancer but it does show extensive atherosclerosis. I informed him that we faxed a copy to Dr. Su Hilt and asked that he follow up with him regarding this issue. He voiced understanding.

## 2012-09-19 NOTE — Telephone Encounter (Signed)
Error

## 2012-10-29 IMAGING — PT NM PET TUM IMG SKULL BASE T - THIGH
6 series · 25 of 25 positions shown · non-contrast
Comparison: 01/18/2011

CLINICAL DATA: Subsequent treatment strategy for seminoma.

NUCLEAR MEDICINE PET SKULL BASE TO THIGH
Fasting Blood Glucose:  97
TECHNIQUE: 18.1 mCi F-18 FDG was injected intravenously. CT data
was obtained and used for attenuation correction and anatomic
localization only.  (This was not acquired as a diagnostic CT
examination.) Additional exam technical data entered on
technologist worksheet.

[Series 1: pet ac · axial · 3.3mm · 4.69mm/px · z∈[-1038,-24]mm · 5 of 311 slices shown]
[im 1/311]
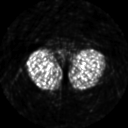
[im 78/311]
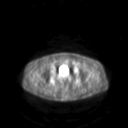
[im 156/311]
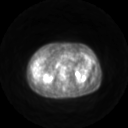
[im 233/311]
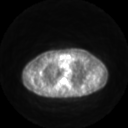
[im 311/311]
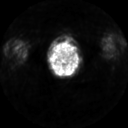

[Series 2: pet nac · axial · 3.3mm · 4.69mm/px · z∈[-1038,-24]mm · 6 of 311 slices shown]
[im 1/311]
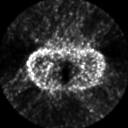
[im 63/311]
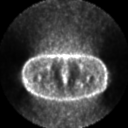
[im 125/311]
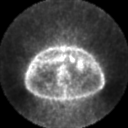
[im 187/311]
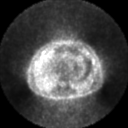
[im 249/311]
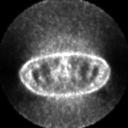
[im 311/311]
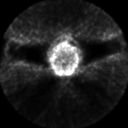

[Series 2: ct images · axial · 3.8mm · 0.98mm/px · z∈[-1038,-24]mm · 6 of 308 slices shown]
[im 1/308]
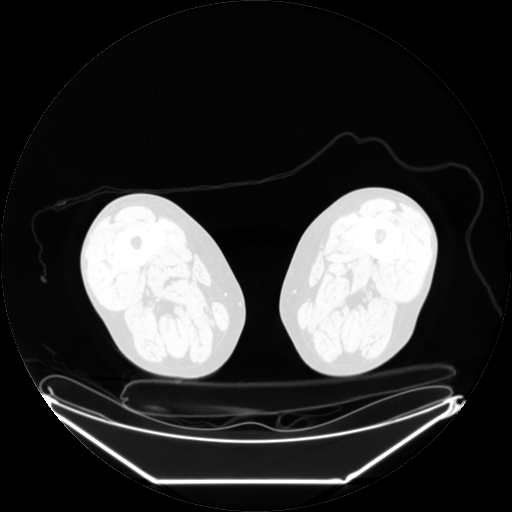
[im 62/308]
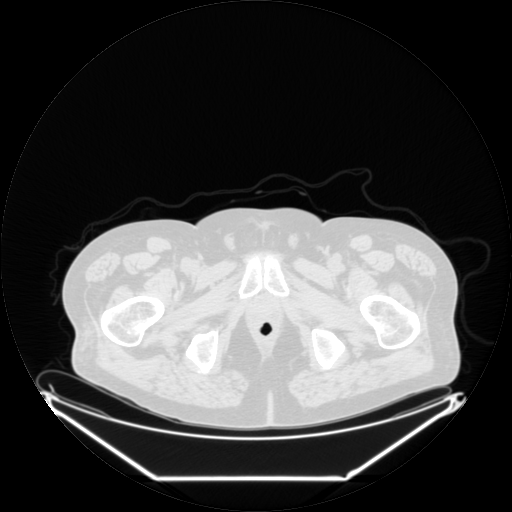
[im 123/308]
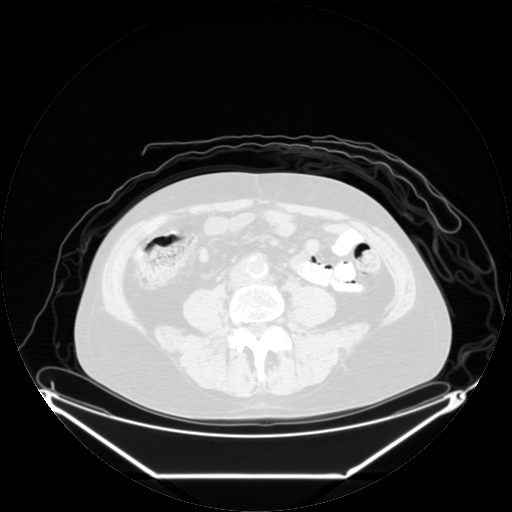
[im 185/308]
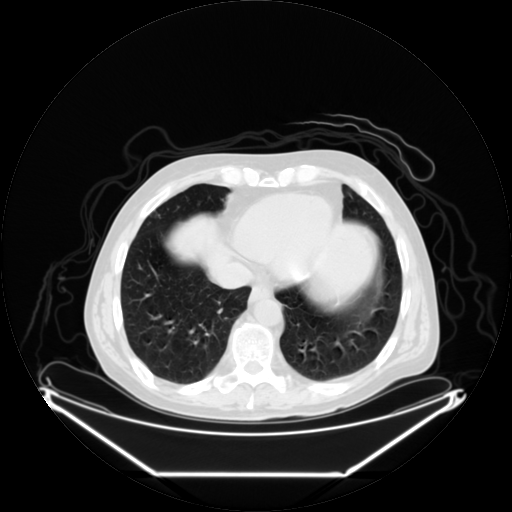
[im 246/308]
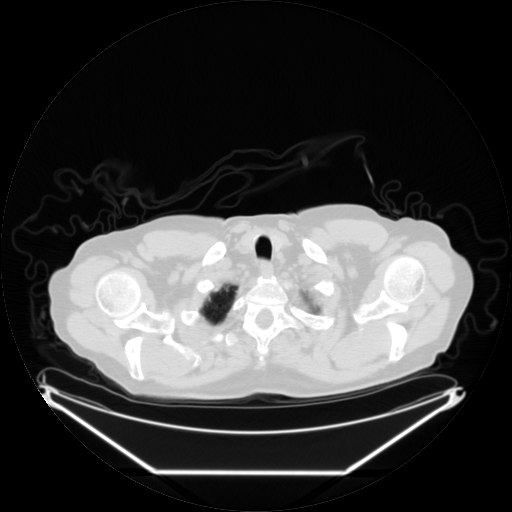
[im 308/308  brain]
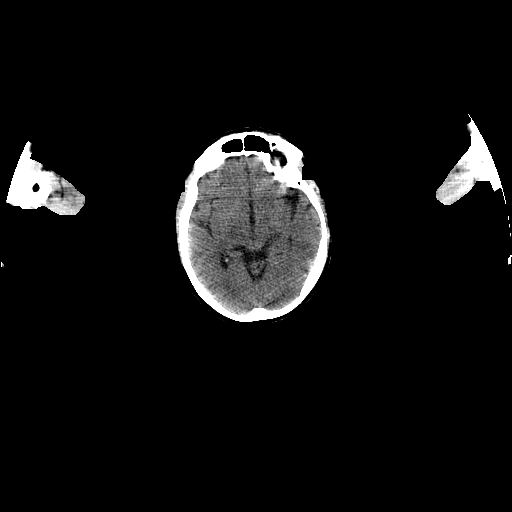

[Series 123: mip · coronal · 3.3mm · 4.69mm/px · 1 of 30 slices shown]
[im 1/30]
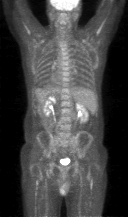

[Series 151: reformatted · axial · 3.3mm · 3.91mm/px · z∈[-1038,-24]mm · 6 of 311 slices shown (1 of 2)]
[im 1/311]
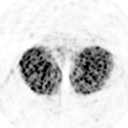
[im 63/311]
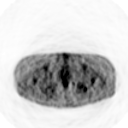
[im 125/311]
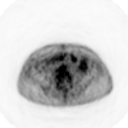
[im 187/311]
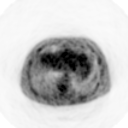
[im 249/311]
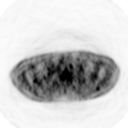
[im 311/311]
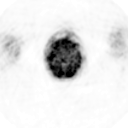

[Series 153: reformatted · coronal · 4.7mm · 8.14mm/px · 1 of 80 slices shown (2 of 2)]
[im 1/80]
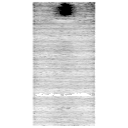

[25 of 25 positions shown; findings below may reference images not displayed]

FINDINGS: Neck: No hypermetabolic lymph nodes in the neck.

Chest:  No hypermetabolic mediastinal or hilar nodes.  No
suspicious pulmonary nodules on the CT scan.

Abdomen/Pelvis:  No abnormal hypermetabolic activity is identified
within the liver or spleen.  The adrenal glands are both normal.
Normal appearance of the pancreas.

Significant interval improvement in abdominal periaortic nodal soft
tissue.  This now measures 4.9 x 2.7 cm, image 170.  The SUV max is
equal to 4.7.  Previously this measured 12.5 x 6.9 cm and had an
SUV max equal 12.8.

There has been resolution of hypermetabolic adenopathy along the
common iliac and external iliac lymph node chain.

Asymmetric enlargement of the right testis has improved.  This
currently measures approximately 4.3 cm and has an SUV max equal to
6.6, image 268.  Previously this measured 8.2 cm and had an SUV max
equal to 14.2.

[place]:  Diffuse bone marrow uptake likely represents post-
treatment changes.
IMPRESSION: 1.  Markedly improved appearance of the upper abdominal and pelvic
adenopathy.  Residual retroperitoneal soft tissue with malignant
range FDG uptake indicates residual tumor.
2.  Decrease in size and FDG uptake associated with a right testis.

## 2012-11-30 ENCOUNTER — Other Ambulatory Visit: Payer: Self-pay

## 2012-11-30 DIAGNOSIS — C6291 Malignant neoplasm of right testis, unspecified whether descended or undescended: Secondary | ICD-10-CM

## 2012-11-30 DIAGNOSIS — D696 Thrombocytopenia, unspecified: Secondary | ICD-10-CM

## 2012-12-03 ENCOUNTER — Telehealth: Payer: Self-pay | Admitting: Internal Medicine

## 2012-12-03 ENCOUNTER — Other Ambulatory Visit (HOSPITAL_BASED_OUTPATIENT_CLINIC_OR_DEPARTMENT_OTHER): Payer: 59 | Admitting: Lab

## 2012-12-03 ENCOUNTER — Ambulatory Visit (HOSPITAL_BASED_OUTPATIENT_CLINIC_OR_DEPARTMENT_OTHER): Payer: 59 | Admitting: Internal Medicine

## 2012-12-03 VITALS — BP 147/89 | HR 52 | Temp 97.7°F | Resp 98 | Ht 72.0 in | Wt 196.4 lb

## 2012-12-03 DIAGNOSIS — C6291 Malignant neoplasm of right testis, unspecified whether descended or undescended: Secondary | ICD-10-CM

## 2012-12-03 DIAGNOSIS — C6290 Malignant neoplasm of unspecified testis, unspecified whether descended or undescended: Secondary | ICD-10-CM

## 2012-12-03 DIAGNOSIS — F17200 Nicotine dependence, unspecified, uncomplicated: Secondary | ICD-10-CM

## 2012-12-03 DIAGNOSIS — Z720 Tobacco use: Secondary | ICD-10-CM

## 2012-12-03 DIAGNOSIS — D696 Thrombocytopenia, unspecified: Secondary | ICD-10-CM

## 2012-12-03 LAB — LACTATE DEHYDROGENASE (CC13): LDH: 110 U/L — ABNORMAL LOW (ref 125–245)

## 2012-12-03 LAB — CBC WITH DIFFERENTIAL/PLATELET
BASO%: 1 % (ref 0.0–2.0)
Eosinophils Absolute: 0.4 10*3/uL (ref 0.0–0.5)
HCT: 43.9 % (ref 38.4–49.9)
LYMPH%: 18.9 % (ref 14.0–49.0)
MCHC: 33.9 g/dL (ref 32.0–36.0)
MCV: 100.6 fL — ABNORMAL HIGH (ref 79.3–98.0)
MONO%: 9.3 % (ref 0.0–14.0)
NEUT%: 61.4 % (ref 39.0–75.0)
Platelets: 187 10*3/uL (ref 140–400)
RBC: 4.36 10*6/uL (ref 4.20–5.82)

## 2012-12-03 LAB — COMPREHENSIVE METABOLIC PANEL (CC13)
Alkaline Phosphatase: 68 U/L (ref 40–150)
Anion Gap: 8 mEq/L (ref 3–11)
Creatinine: 1 mg/dL (ref 0.7–1.3)
Glucose: 97 mg/dl (ref 70–140)
Sodium: 138 mEq/L (ref 136–145)
Total Bilirubin: 0.53 mg/dL (ref 0.20–1.20)
Total Protein: 6.7 g/dL (ref 6.4–8.3)

## 2012-12-03 NOTE — Patient Instructions (Addendum)
1. Patient will return to clinic in 4 months. 2. We will follow labs including CBC, CMEt, LDH, tumor markers including AFP and Beta-HCG 3. Scans every 6 months including CT of abdomen and pelvis and CXR.  Next scan in January.   Smoking Cessation Quitting smoking is important to your health and has many advantages. However, it is not always easy to quit since nicotine is a very addictive drug. Often times, people try 3 times or more before being able to quit. This document explains the best ways for you to prepare to quit smoking. Quitting takes hard work and a lot of effort, but you can do it. ADVANTAGES OF QUITTING SMOKING  You will live longer, feel better, and live better.  Your body will feel the impact of quitting smoking almost immediately.  Within 20 minutes, blood pressure decreases. Your pulse returns to its normal level.  After 8 hours, carbon monoxide levels in the blood return to normal. Your oxygen level increases.  After 24 hours, the chance of having a heart attack starts to decrease. Your breath, hair, and body stop smelling like smoke.  After 48 hours, damaged nerve endings begin to recover. Your sense of taste and smell improve.  After 72 hours, the body is virtually free of nicotine. Your bronchial tubes relax and breathing becomes easier.  After 2 to 12 weeks, lungs can hold more air. Exercise becomes easier and circulation improves.  The risk of having a heart attack, stroke, cancer, or lung disease is greatly reduced.  After 1 year, the risk of coronary heart disease is cut in half.  After 5 years, the risk of stroke falls to the same as a nonsmoker.  After 10 years, the risk of lung cancer is cut in half and the risk of other cancers decreases significantly.  After 15 years, the risk of coronary heart disease drops, usually to the level of a nonsmoker.  If you are pregnant, quitting smoking will improve your chances of having a healthy baby.  The people you  live with, especially any children, will be healthier.  You will have extra money to spend on things other than cigarettes. QUESTIONS TO THINK ABOUT BEFORE ATTEMPTING TO QUIT You may want to talk about your answers with your caregiver.  Why do you want to quit?  If you tried to quit in the past, what helped and what did not?  What will be the most difficult situations for you after you quit? How will you plan to handle them?  Who can help you through the tough times? Your family? Friends? A caregiver?  What pleasures do you get from smoking? What ways can you still get pleasure if you quit? Here are some questions to ask your caregiver:  How can you help me to be successful at quitting?  What medicine do you think would be best for me and how should I take it?  What should I do if I need more help?  What is smoking withdrawal like? How can I get information on withdrawal? GET READY  Set a quit date.  Change your environment by getting rid of all cigarettes, ashtrays, matches, and lighters in your home, car, or work. Do not let people smoke in your home.  Review your past attempts to quit. Think about what worked and what did not. GET SUPPORT AND ENCOURAGEMENT You have a better chance of being successful if you have help. You can get support in many ways.  Tell your family,  friends, and co-workers that you are going to quit and need their support. Ask them not to smoke around you.  Get individual, group, or telephone counseling and support. Programs are available at [employer] and health centers. Call your local health department for information about programs in your area.  Spiritual beliefs and practices may help some smokers quit.  Download a "quit meter" on your computer to keep track of quit statistics, such as how long you have gone without smoking, cigarettes not smoked, and money saved.  Get a self-help book about quitting smoking and staying off of  tobacco. LEARN NEW SKILLS AND BEHAVIORS  Distract yourself from urges to smoke. Talk to someone, go for a walk, or occupy your time with a task.  Change your normal routine. Take a different route to work. Drink tea instead of coffee. Eat breakfast in a different place.  Reduce your stress. Take a hot bath, exercise, or read a book.  Plan something enjoyable to do every day. Reward yourself for not smoking.  Explore interactive web-based programs that specialize in helping you quit. GET MEDICINE AND USE IT CORRECTLY Medicines can help you stop smoking and decrease the urge to smoke. Combining medicine with the above behavioral methods and support can greatly increase your chances of successfully quitting smoking.  Nicotine replacement therapy helps deliver nicotine to your body without the negative effects and risks of smoking. Nicotine replacement therapy includes nicotine gum, lozenges, inhalers, nasal sprays, and skin patches. Some may be available over-the-counter and others require a prescription.  Antidepressant medicine helps people abstain from smoking, but how this works is unknown. This medicine is available by prescription.  Nicotinic receptor partial agonist medicine simulates the effect of nicotine in your brain. This medicine is available by prescription. Ask your caregiver for advice about which medicines to use and how to use them based on your health history. Your caregiver will tell you what side effects to look out for if you choose to be on a medicine or therapy. Carefully read the information on the package. Do not use any other product containing nicotine while using a nicotine replacement product.  RELAPSE OR DIFFICULT SITUATIONS Most relapses occur within the first 3 months after quitting. Do not be discouraged if you start smoking again. Remember, most people try several times before finally quitting. You may have symptoms of withdrawal because your body is used to  nicotine. You may crave cigarettes, be irritable, feel very hungry, cough often, get headaches, or have difficulty concentrating. The withdrawal symptoms are only temporary. They are strongest when you first quit, but they will go away within 10 14 days. To reduce the chances of relapse, try to:  Avoid drinking alcohol. Drinking lowers your chances of successfully quitting.  Reduce the amount of caffeine you consume. Once you quit smoking, the amount of caffeine in your body increases and can give you symptoms, such as a rapid heartbeat, sweating, and anxiety.  Avoid smokers because they can make you want to smoke.  Do not let weight gain distract you. Many smokers will gain weight when they quit, usually less than 10 pounds. Eat a healthy diet and stay active. You can always lose the weight gained after you quit.  Find ways to improve your mood other than smoking. FOR MORE INFORMATION  www.smokefree.gov  Document Released: 01/18/2001 Document Revised: 07/26/2011 Document Reviewed: 05/05/2011 [facility] Patient Information 2014 [place], [place].
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
## 2012-12-03 NOTE — Telephone Encounter (Signed)
gv and printed papt sched and avs for pt for Jan and Feb 2015

## 2012-12-04 DIAGNOSIS — Z720 Tobacco use: Secondary | ICD-10-CM | POA: Insufficient documentation

## 2013-02-28 ENCOUNTER — Telehealth: Payer: Self-pay

## 2013-02-28 NOTE — Telephone Encounter (Signed)
Returning pt call from 1031 this am. He had his colonoscopy where they removed 4 polyps that were benign. He was confirming his upcoming CT scan and he wants a disc to take to [facility] appt. Told him to inform CT tech and they may be able to get it ready then or he can pick up on 03/12/13 when he has his appt at [facility]. This information forwarded to Dr Juliann Mule

## 2013-03-05 ENCOUNTER — Ambulatory Visit: Payer: 59

## 2013-03-08 ENCOUNTER — Encounter (HOSPITAL_COMMUNITY): Payer: Self-pay

## 2013-03-08 ENCOUNTER — Ambulatory Visit (HOSPITAL_COMMUNITY)
Admission: RE | Admit: 2013-03-08 | Discharge: 2013-03-08 | Disposition: A | Discharge status: Home / Self Care | Payer: 59 | Source: Ambulatory Visit | Attending: Internal Medicine | Admitting: Internal Medicine

## 2013-03-08 ENCOUNTER — Other Ambulatory Visit (HOSPITAL_BASED_OUTPATIENT_CLINIC_OR_DEPARTMENT_OTHER): Payer: 59

## 2013-03-08 ENCOUNTER — Encounter (INDEPENDENT_AMBULATORY_CARE_PROVIDER_SITE_OTHER): Payer: Self-pay

## 2013-03-08 DIAGNOSIS — C6291 Malignant neoplasm of right testis, unspecified whether descended or undescended: Secondary | ICD-10-CM

## 2013-03-08 DIAGNOSIS — Z9221 Personal history of antineoplastic chemotherapy: Secondary | ICD-10-CM | POA: Insufficient documentation

## 2013-03-08 DIAGNOSIS — C6290 Malignant neoplasm of unspecified testis, unspecified whether descended or undescended: Secondary | ICD-10-CM | POA: Insufficient documentation

## 2013-03-08 DIAGNOSIS — I7090 Unspecified atherosclerosis: Secondary | ICD-10-CM | POA: Insufficient documentation

## 2013-03-08 LAB — CBC WITH DIFFERENTIAL/PLATELET
BASO%: 0.8 % (ref 0.0–2.0)
Basophils Absolute: 0 10*3/uL (ref 0.0–0.1)
EOS%: 11.1 % — AB (ref 0.0–7.0)
Eosinophils Absolute: 0.6 10*3/uL — ABNORMAL HIGH (ref 0.0–0.5)
HCT: 46.2 % (ref 38.4–49.9)
HGB: 15.8 g/dL (ref 13.0–17.1)
LYMPH%: 16.4 % (ref 14.0–49.0)
MCH: 34 pg — ABNORMAL HIGH (ref 27.2–33.4)
MCHC: 34.1 g/dL (ref 32.0–36.0)
MCV: 99.7 fL — AB (ref 79.3–98.0)
MONO#: 0.5 10*3/uL (ref 0.1–0.9)
MONO%: 8.5 % (ref 0.0–14.0)
NEUT#: 3.7 10*3/uL (ref 1.5–6.5)
NEUT%: 63.2 % (ref 39.0–75.0)
PLATELETS: 204 10*3/uL (ref 140–400)
RBC: 4.63 10*6/uL (ref 4.20–5.82)
RDW: 13.3 % (ref 11.0–14.6)
WBC: 5.8 10*3/uL (ref 4.0–10.3)
lymph#: 0.9 10*3/uL (ref 0.9–3.3)

## 2013-03-08 LAB — COMPREHENSIVE METABOLIC PANEL (CC13)
ALT: 24 U/L (ref 0–55)
AST: 19 U/L (ref 5–34)
Albumin: 4.2 g/dL (ref 3.5–5.0)
Alkaline Phosphatase: 81 U/L (ref 40–150)
Anion Gap: 11 meq/L (ref 3–11)
BUN: 17.7 mg/dL (ref 7.0–26.0)
CO2: 21 meq/L — ABNORMAL LOW (ref 22–29)
Calcium: 9.8 mg/dL (ref 8.4–10.4)
Chloride: 105 meq/L (ref 98–109)
Creatinine: 1.1 mg/dL (ref 0.7–1.3)
Glucose: 97 mg/dL (ref 70–140)
Potassium: 4.3 meq/L (ref 3.5–5.1)
Sodium: 137 meq/L (ref 136–145)
Total Bilirubin: 0.64 mg/dL (ref 0.20–1.20)
Total Protein: 7.3 g/dL (ref 6.4–8.3)

## 2013-03-08 LAB — LACTATE DEHYDROGENASE (CC13): LDH: 109 U/L — ABNORMAL LOW (ref 125–245)

## 2013-03-08 MED ORDER — IOHEXOL 300 MG/ML  SOLN
100.0000 mL | Freq: Once | INTRAMUSCULAR | Status: AC | PRN
  Administered 2013-03-08: 100 mL via INTRAVENOUS

## 2013-03-11 LAB — BETA 2 MICROGLOBULIN, SERUM: Beta-2 Microglobulin: 2.28 mg/L — ABNORMAL HIGH (ref 1.01–1.73)

## 2013-03-11 LAB — AFP TUMOR MARKER: AFP-Tumor Marker: 2.3 ng/mL (ref 0.0–8.0)

## 2013-03-12 ENCOUNTER — Other Ambulatory Visit: Payer: Self-pay | Admitting: Internal Medicine

## 2013-03-12 ENCOUNTER — Telehealth: Payer: Self-pay | Admitting: Internal Medicine

## 2013-03-12 ENCOUNTER — Encounter: Payer: Self-pay | Admitting: Internal Medicine

## 2013-03-12 ENCOUNTER — Ambulatory Visit (HOSPITAL_BASED_OUTPATIENT_CLINIC_OR_DEPARTMENT_OTHER): Payer: 59 | Admitting: Internal Medicine

## 2013-03-12 VITALS — BP 122/80 | HR 70 | Temp 97.1°F | Resp 18 | Ht 72.0 in | Wt 199.0 lb

## 2013-03-12 DIAGNOSIS — C6290 Malignant neoplasm of unspecified testis, unspecified whether descended or undescended: Secondary | ICD-10-CM

## 2013-03-12 DIAGNOSIS — D696 Thrombocytopenia, unspecified: Secondary | ICD-10-CM

## 2013-03-12 DIAGNOSIS — F17200 Nicotine dependence, unspecified, uncomplicated: Secondary | ICD-10-CM

## 2013-03-12 DIAGNOSIS — Z720 Tobacco use: Secondary | ICD-10-CM
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
## 2013-03-12 NOTE — Telephone Encounter (Signed)
gv pt appt schedule for july/august. [facility] will call pt w/ct appt.

## 2013-03-14 LAB — BETA HCG QUANT (REF LAB)

## 2013-08-28 ENCOUNTER — Telehealth: Payer: Self-pay | Admitting: Internal Medicine

## 2013-08-28 NOTE — Telephone Encounter (Signed)
Pt came in to req schedule, gave pt updated schedule...KJ

## 2013-09-03 ENCOUNTER — Other Ambulatory Visit (HOSPITAL_BASED_OUTPATIENT_CLINIC_OR_DEPARTMENT_OTHER): Payer: 59

## 2013-09-03 ENCOUNTER — Ambulatory Visit (HOSPITAL_COMMUNITY)
Admission: RE | Admit: 2013-09-03 | Discharge: 2013-09-03 | Disposition: A | Discharge status: Home / Self Care | Payer: 59 | Source: Ambulatory Visit | Attending: Internal Medicine | Admitting: Internal Medicine

## 2013-09-03 DIAGNOSIS — C768 Malignant neoplasm of other specified ill-defined sites: Secondary | ICD-10-CM | POA: Diagnosis present

## 2013-09-03 DIAGNOSIS — I700 Atherosclerosis of aorta: Secondary | ICD-10-CM | POA: Diagnosis not present

## 2013-09-03 DIAGNOSIS — N289 Disorder of kidney and ureter, unspecified: Secondary | ICD-10-CM | POA: Insufficient documentation

## 2013-09-03 DIAGNOSIS — D696 Thrombocytopenia, unspecified: Secondary | ICD-10-CM

## 2013-09-03 DIAGNOSIS — C6290 Malignant neoplasm of unspecified testis, unspecified whether descended or undescended: Secondary | ICD-10-CM

## 2013-09-03 LAB — CBC WITH DIFFERENTIAL/PLATELET
BASO%: 1 % (ref 0.0–2.0)
BASOS ABS: 0.1 10*3/uL (ref 0.0–0.1)
EOS%: 10.8 % — AB (ref 0.0–7.0)
Eosinophils Absolute: 0.6 10*3/uL — ABNORMAL HIGH (ref 0.0–0.5)
HCT: 45.4 % (ref 38.4–49.9)
HEMOGLOBIN: 15 g/dL (ref 13.0–17.1)
LYMPH#: 1.1 10*3/uL (ref 0.9–3.3)
LYMPH%: 20.1 % (ref 14.0–49.0)
MCH: 33 pg (ref 27.2–33.4)
MCHC: 33.1 g/dL (ref 32.0–36.0)
MCV: 99.6 fL — ABNORMAL HIGH (ref 79.3–98.0)
MONO#: 0.4 10*3/uL (ref 0.1–0.9)
MONO%: 8.2 % (ref 0.0–14.0)
NEUT%: 59.9 % (ref 39.0–75.0)
NEUTROS ABS: 3.2 10*3/uL (ref 1.5–6.5)
Platelets: 204 10*3/uL (ref 140–400)
RBC: 4.56 10*6/uL (ref 4.20–5.82)
RDW: 13.3 % (ref 11.0–14.6)
WBC: 5.3 10*3/uL (ref 4.0–10.3)

## 2013-09-03 LAB — COMPREHENSIVE METABOLIC PANEL (CC13)
ALBUMIN: 4 g/dL (ref 3.5–5.0)
ALT: 43 U/L (ref 0–55)
AST: 33 U/L (ref 5–34)
Alkaline Phosphatase: 70 U/L (ref 40–150)
Anion Gap: 9 mEq/L (ref 3–11)
BUN: 21.7 mg/dL (ref 7.0–26.0)
CALCIUM: 9.6 mg/dL (ref 8.4–10.4)
CHLORIDE: 106 meq/L (ref 98–109)
CO2: 23 mEq/L (ref 22–29)
Creatinine: 1.3 mg/dL (ref 0.7–1.3)
Glucose: 93 mg/dl (ref 70–140)
POTASSIUM: 4.2 meq/L (ref 3.5–5.1)
SODIUM: 138 meq/L (ref 136–145)
TOTAL PROTEIN: 7 g/dL (ref 6.4–8.3)
Total Bilirubin: 0.66 mg/dL (ref 0.20–1.20)

## 2013-09-03 LAB — LACTATE DEHYDROGENASE (CC13): LDH: 113 U/L — AB (ref 125–245)

## 2013-09-03 MED ORDER — IOHEXOL 300 MG/ML  SOLN
100.0000 mL | Freq: Once | INTRAMUSCULAR | Status: AC | PRN
  Administered 2013-09-03: 100 mL via INTRAVENOUS

## 2013-09-04 ENCOUNTER — Telehealth: Payer: Self-pay | Admitting: Internal Medicine

## 2013-09-04 NOTE — Telephone Encounter (Signed)
, °

## 2013-09-06 ENCOUNTER — Ambulatory Visit (HOSPITAL_BASED_OUTPATIENT_CLINIC_OR_DEPARTMENT_OTHER): Payer: 59 | Admitting: Internal Medicine

## 2013-09-06 ENCOUNTER — Telehealth: Payer: Self-pay | Admitting: Internal Medicine

## 2013-09-06 VITALS — BP 151/83 | HR 67 | Temp 97.7°F | Resp 18 | Ht 72.0 in | Wt 198.1 lb

## 2013-09-06 DIAGNOSIS — F17200 Nicotine dependence, unspecified, uncomplicated: Secondary | ICD-10-CM

## 2013-09-06 DIAGNOSIS — I82409 Acute embolism and thrombosis of unspecified deep veins of unspecified lower extremity: Secondary | ICD-10-CM

## 2013-09-06 DIAGNOSIS — C6290 Malignant neoplasm of unspecified testis, unspecified whether descended or undescended: Secondary | ICD-10-CM

## 2013-09-06 DIAGNOSIS — C6291 Malignant neoplasm of right testis, unspecified whether descended or undescended: Secondary | ICD-10-CM

## 2013-09-06 DIAGNOSIS — Z720 Tobacco use: Secondary | ICD-10-CM

## 2013-09-06 LAB — BETA HCG QUANT (REF LAB): Beta hCG, Tumor Marker: <2 m[IU]/mL (ref <5.0)

## 2013-09-06 LAB — AFP TUMOR MARKER: AFP TUMOR MARKER: 2.5 ng/mL (ref 0.0–8.0)
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
## 2013-09-06 NOTE — Telephone Encounter (Signed)
gv adn rpinted appt sched and avs for pt for Jan 2016.....gv pt barium

## 2013-09-10 ENCOUNTER — Ambulatory Visit: Payer: 59

## 2013-12-04 ENCOUNTER — Other Ambulatory Visit: Payer: Self-pay | Admitting: Nurse Practitioner

## 2014-02-22 ENCOUNTER — Telehealth: Payer: Self-pay | Admitting: Oncology

## 2014-02-22 NOTE — Telephone Encounter (Signed)
, °

## 2014-03-04 ENCOUNTER — Other Ambulatory Visit: Payer: Self-pay | Admitting: Oncology

## 2014-03-04 DIAGNOSIS — C6290 Malignant neoplasm of unspecified testis, unspecified whether descended or undescended: Secondary | ICD-10-CM

## 2014-03-05 ENCOUNTER — Encounter (HOSPITAL_COMMUNITY): Payer: Self-pay

## 2014-03-05 ENCOUNTER — Ambulatory Visit (HOSPITAL_COMMUNITY)
Admission: RE | Admit: 2014-03-05 | Discharge: 2014-03-05 | Disposition: A | Discharge status: Home / Self Care | Payer: 59 | Source: Ambulatory Visit | Attending: Internal Medicine | Admitting: Internal Medicine

## 2014-03-05 ENCOUNTER — Other Ambulatory Visit (HOSPITAL_BASED_OUTPATIENT_CLINIC_OR_DEPARTMENT_OTHER): Payer: 59

## 2014-03-05 DIAGNOSIS — R599 Enlarged lymph nodes, unspecified: Secondary | ICD-10-CM | POA: Diagnosis not present

## 2014-03-05 DIAGNOSIS — C6291 Malignant neoplasm of right testis, unspecified whether descended or undescended: Secondary | ICD-10-CM | POA: Insufficient documentation

## 2014-03-05 DIAGNOSIS — C6290 Malignant neoplasm of unspecified testis, unspecified whether descended or undescended: Secondary | ICD-10-CM

## 2014-03-05 DIAGNOSIS — K760 Fatty (change of) liver, not elsewhere classified: Secondary | ICD-10-CM | POA: Insufficient documentation

## 2014-03-05 LAB — COMPREHENSIVE METABOLIC PANEL (CC13)
ALK PHOS: 62 U/L (ref 40–150)
ALT: 26 U/L (ref 0–55)
AST: 23 U/L (ref 5–34)
Albumin: 4 g/dL (ref 3.5–5.0)
Anion Gap: 11 mEq/L (ref 3–11)
BILIRUBIN TOTAL: 0.58 mg/dL (ref 0.20–1.20)
BUN: 19.9 mg/dL (ref 7.0–26.0)
CO2: 22 mEq/L (ref 22–29)
Calcium: 9.3 mg/dL (ref 8.4–10.4)
Chloride: 106 mEq/L (ref 98–109)
Creatinine: 1.2 mg/dL (ref 0.7–1.3)
EGFR: 61 mL/min/{1.73_m2} — ABNORMAL LOW (ref >90)
GLUCOSE: 90 mg/dL (ref 70–140)
Potassium: 4.5 mEq/L (ref 3.5–5.1)
Sodium: 139 mEq/L (ref 136–145)
Total Protein: 6.9 g/dL (ref 6.4–8.3)

## 2014-03-05 LAB — CBC WITH DIFFERENTIAL/PLATELET
BASO%: 1.4 % (ref 0.0–2.0)
Basophils Absolute: 0.1 10*3/uL (ref 0.0–0.1)
EOS%: 9.7 % — AB (ref 0.0–7.0)
Eosinophils Absolute: 0.5 10*3/uL (ref 0.0–0.5)
HCT: 45.2 % (ref 38.4–49.9)
HGB: 14.7 g/dL (ref 13.0–17.1)
LYMPH%: 21.4 % (ref 14.0–49.0)
MCH: 32.5 pg (ref 27.2–33.4)
MCHC: 32.5 g/dL (ref 32.0–36.0)
MCV: 100 fL — ABNORMAL HIGH (ref 79.3–98.0)
MONO#: 0.4 10*3/uL (ref 0.1–0.9)
MONO%: 8.1 % (ref 0.0–14.0)
NEUT#: 3 10*3/uL (ref 1.5–6.5)
NEUT%: 59.4 % (ref 39.0–75.0)
Platelets: 209 10*3/uL (ref 140–400)
RBC: 4.52 10*6/uL (ref 4.20–5.82)
RDW: 13.3 % (ref 11.0–14.6)
WBC: 5.1 10*3/uL (ref 4.0–10.3)
lymph#: 1.1 10*3/uL (ref 0.9–3.3)

## 2014-03-05 LAB — LACTATE DEHYDROGENASE (CC13): LDH: 111 U/L — AB (ref 125–245)

## 2014-03-05 MED ORDER — IOHEXOL 300 MG/ML  SOLN
100.0000 mL | Freq: Once | INTRAMUSCULAR | Status: AC | PRN
  Administered 2014-03-05: 100 mL via INTRAVENOUS

## 2014-03-07 ENCOUNTER — Ambulatory Visit (HOSPITAL_BASED_OUTPATIENT_CLINIC_OR_DEPARTMENT_OTHER): Payer: 59 | Admitting: Oncology

## 2014-03-07 ENCOUNTER — Telehealth: Payer: Self-pay | Admitting: Oncology

## 2014-03-07 VITALS — BP 143/93 | HR 59 | Temp 97.8°F | Resp 18 | Ht 72.0 in | Wt 205.0 lb

## 2014-03-07 DIAGNOSIS — C6290 Malignant neoplasm of unspecified testis, unspecified whether descended or undescended: Secondary | ICD-10-CM

## 2014-03-07 DIAGNOSIS — Z8547 Personal history of malignant neoplasm of testis: Secondary | ICD-10-CM

## 2014-03-07 NOTE — Addendum Note (Signed)
Addended by: Randolm Idol on: 03/07/2014 10:22 AM   Modules accepted: Medications

## 2014-03-07 NOTE — Progress Notes (Signed)
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 Initial beta hCG was 258.8, and the initial LDH was 530.   Prior therapy: Chemotherapy with cisplatin and VP-16 was started on January 16, 9982, complicated by severe pancytopenia requiring hospitalizations, blood transfusion and intensive care. Neulasta was given with cycle 1 of treatment.   A PET scan carried out on 05/11/2011 continues to show residual hypermetabolic mass although there has been considerable shrinkage. Mr. Gieske completed 5 cycles of cisplatin VP-16 chemotherapy as of mid March 2013.  He underwent surgery at [facility] by Dr. Lorine Bears on 06/23/2011. This consisted of a right inguinal radical orchiectomy and retroperitoneal lymphadenectomy. Copies of the pathology report and operative report were sent to [place] and indicate that there was no evidence of a viable tumor in any of the resected specimens.  CURRENT THERAPY: Observation, follow-up  INTERVAL HISTORY: Mr. Capell presents today for a follow-up visit. Since the last visit, he has no complaints. He does not report any abdominal pain or discomfort. He does not report any pelvic masses or testicular masses. He continues to be very active and enjoys reasonable quality of life. He reports that he have regained most activities of daily living before his diagnosis of cancer. He does not report any headaches or blurry vision or syncope. Does not report any fevers or chills or sweats. Does not report any chest pain palpitation or orthopnea. Does not report any cough, wheezing or hemoptysis. Does not report a nausea, vomiting, abdominal pain or changes bowel habits. Does not report a frequency, urgency or hesitancy. Rest of his review of systems unremarkable.  MEDICAL  HISTORY: Past Medical History  Diagnosis Date  . Cancer dx'd 12/2010    right testicle, behind stomach     ALLERGIES:  has No Known Allergies.  MEDICATIONS: has a current medication list which includes the following prescription(s): b complex vitamins and simvastatin.  PHYSICAL EXAMINATION: ECOG PERFORMANCE STATUS: 0 - Asymptomatic  Blood pressure 143/93, pulse 59, temperature 97.8 F (36.6 C), temperature source Oral, resp. rate 18, height 6' (1.829 m), weight 205 lb (92.987 kg).  GENERAL:alert, no distress. SKIN: skin color, texture, turgor are normal. EYES: normal, Conjunctiva are pink and non-injected, sclera clear OROPHARYNX:no exudate, no erythema and lips, buccal mucosa, and tongue normal  NECK: supple, thyroid normal size, non-tender, no lymphadenopathy.  LYMPH:  no palpable lymphadenopathy in the cervical, axillary or supraclavicular LUNGS: clear to auscultation and percussion with normal breathing effort HEART: regular rate & rhythm and no murmurs and no lower extremity edema ABDOMEN:abdomen soft, non-tender and normal bowel sounds Musculoskeletal:no cyanosis of digits and no clubbing  NEURO: alert & oriented x 3 with fluent speech, no focal motor/sensory deficits  Labs:  Lab Results  Component Value Date   WBC 5.1 03/05/2014   HGB 14.7 03/05/2014   HCT 45.2 03/05/2014   MCV 100.0* 03/05/2014   PLT 209 03/05/2014   NEUTROABS 3.0 03/05/2014      Chemistry      Component Value Date/Time   NA 139 03/05/2014 0758   NA 138 04/10/2012 1135   K 4.5 03/05/2014 0758   K 4.0 04/10/2012 1135   CL 103 04/10/2012 1135   CL 105 12/13/2011 1137   CO2 22 03/05/2014 0758   CO2 25 04/10/2012 1135   BUN 19.9 03/05/2014  0758   BUN 16 04/10/2012 1135   CREATININE 1.2 03/05/2014 0758   CREATININE 1.19 04/10/2012 1135      Component Value Date/Time   CALCIUM 9.3 03/05/2014 0758   CALCIUM 9.4 04/10/2012 1135   ALKPHOS 62 03/05/2014 0758   ALKPHOS 106 04/10/2012 1135    AST 23 03/05/2014 0758   AST 16 04/10/2012 1135   ALT 26 03/05/2014 0758   ALT 10 04/10/2012 1135   BILITOT 0.58 03/05/2014 0758   BILITOT 0.3 04/10/2012 1135     CLINICAL DATA: History of right testicular seminoma and bulky retroperitoneal lymphadenopathy. Diagnosis in 2012.  EXAM: CT ABDOMEN AND PELVIS WITH CONTRAST  TECHNIQUE: Multidetector CT imaging of the abdomen and pelvis was performed using the standard protocol following bolus administration of intravenous contrast.  CONTRAST: 139mL OMNIPAQUE IOHEXOL 300 MG/ML SOLN  COMPARISON: 12/10/2010 and 09/03/2013  FINDINGS: Lower chest: The lung bases are clear. No pulmonary lesions or pleural effusion. The heart is normal in size. No pericardial effusion. The distal esophagus is grossly normal.  Hepatobiliary: Diffuse fatty infiltration of the liver but no focal hepatic lesions or intrahepatic biliary dilatation. The gallbladder is normal. No common bowel duct dilatation.  Pancreas: Normal.  Spleen: Normal.  Adrenals/Urinary Tract: Mild stable nodularity of the left adrenal gland but no discrete lesion. Stable scarring changes involving the right kidney but no worrisome renal lesions or hydronephrosis.  Stomach/Bowel: The stomach, duodenum, small bowel and colon are unremarkable. No inflammatory changes, mass lesions or obstructive findings.  Vascular/Lymphatic: Stable matted soft tissue density in the right retroperitoneum but no findings for recurrent lymphadenopathy. Stable surgical changes. The aorta and branch vessels are patent. Stable atherosclerotic disease involving the distal aorta and iliac arteries. The major venous structures are patent.  Pelvis: No pelvic mass or adenopathy. No free pelvic fluid collections. The bladder, prostate and seminal vesicles are unremarkable. No inguinal mass or adenopathy.  Musculoskeletal: No significant bony findings. Stable degenerative changes  involving the spine and remote healed right eleventh rib fracture.  IMPRESSION: 1. Stable CT appearance of the abdomen/pelvis. No findings for recurrent tumor/adenopathy. 2. Diffuse fatty infiltration of the liver. 3. Stable right renal scarring changes. 4. Stable atherosclerotic disease involving the distal aorta and iliac arteries.  EXAM: CHEST 2 VIEW  COMPARISON: PA and lateral chest of March 08, 2013  FINDINGS: The lungs are well-expanded. There is no focal infiltrate. No pulmonary parenchymal nodules are demonstrated. The heart and pulmonary vascularity are normal. There is no evidence of mediastinal or hilar lymphadenopathy. There is no pleural effusion.  There is an old fracture of the posterior aspect of the right ninth rib. There is old deformity of the right scapula in the region of the glenoid. The thoracic vertebral bodies are preserved in height  IMPRESSION: 1. There is no active cardiopulmonary disease. There are no findings to suggest metastatic disease. 2. Chronic bony deformities of the right ninth rib and right scapula are noted.   ASSESSMENT:    64 year old gentleman with the following issues:    1. Seminoma, right. His CT scan of the abdomen on 03/05/2014 as well as his chest x-ray and laboratory testing were reviewed and showed no evidence of disease recurrence. The plan is to continue with active surveillance with imaging studies every 6 months as well as laboratory testing and physical examination throughout year 3 and 4. After that we will switch him to annual testing.   2. Follow-up. In 6 months for a CT scan, chest x-ray laboratory  testing.     [facility], MD 03/07/2014 9:29 AM

## 2014-03-07 NOTE — Telephone Encounter (Signed)
gv and printd appt sched and avs for pt for July and Aug...gv pt barium

## 2014-03-10 LAB — AFP TUMOR MARKER: AFP-Tumor Marker: 3.8 ng/mL (ref <6.1)

## 2014-03-10 LAB — BETA HCG QUANT (REF LAB): Beta hCG, Tumor Marker: <2 m[IU]/mL (ref <5.0)

## 2014-03-10 LAB — AFP TUMOR MARKER-PREVIOUS METHOD: AFP TUMOR MARKER: 3.7 ng/mL (ref 0.0–8.0)

## 2014-09-05 ENCOUNTER — Encounter (HOSPITAL_COMMUNITY): Payer: Self-pay

## 2014-09-05 ENCOUNTER — Ambulatory Visit (HOSPITAL_COMMUNITY)
Admission: RE | Admit: 2014-09-05 | Discharge: 2014-09-05 | Disposition: A | Discharge status: Home / Self Care | Payer: 59 | Source: Ambulatory Visit | Attending: Oncology | Admitting: Oncology

## 2014-09-05 ENCOUNTER — Other Ambulatory Visit (HOSPITAL_BASED_OUTPATIENT_CLINIC_OR_DEPARTMENT_OTHER): Payer: 59

## 2014-09-05 DIAGNOSIS — Z8547 Personal history of malignant neoplasm of testis: Secondary | ICD-10-CM

## 2014-09-05 DIAGNOSIS — C6291 Malignant neoplasm of right testis, unspecified whether descended or undescended: Secondary | ICD-10-CM | POA: Insufficient documentation

## 2014-09-05 DIAGNOSIS — C6290 Malignant neoplasm of unspecified testis, unspecified whether descended or undescended: Secondary | ICD-10-CM

## 2014-09-05 LAB — COMPREHENSIVE METABOLIC PANEL (CC13)
ALK PHOS: 52 U/L (ref 40–150)
ALT: 22 U/L (ref 0–55)
AST: 22 U/L (ref 5–34)
Albumin: 3.8 g/dL (ref 3.5–5.0)
Anion Gap: 9 mEq/L (ref 3–11)
BILIRUBIN TOTAL: 0.55 mg/dL (ref 0.20–1.20)
BUN: 24.5 mg/dL (ref 7.0–26.0)
CO2: 21 mEq/L — ABNORMAL LOW (ref 22–29)
Calcium: 9.2 mg/dL (ref 8.4–10.4)
Chloride: 109 mEq/L (ref 98–109)
Creatinine: 1.4 mg/dL — ABNORMAL HIGH (ref 0.7–1.3)
EGFR: 52 mL/min/{1.73_m2} — ABNORMAL LOW (ref >90)
Glucose: 91 mg/dl (ref 70–140)
Potassium: 4.3 mEq/L (ref 3.5–5.1)
SODIUM: 139 meq/L (ref 136–145)
Total Protein: 6.5 g/dL (ref 6.4–8.3)

## 2014-09-05 LAB — CBC WITH DIFFERENTIAL/PLATELET
BASO%: 2.1 % — AB (ref 0.0–2.0)
BASOS ABS: 0.1 10*3/uL (ref 0.0–0.1)
EOS%: 8.9 % — ABNORMAL HIGH (ref 0.0–7.0)
Eosinophils Absolute: 0.4 10*3/uL (ref 0.0–0.5)
HEMATOCRIT: 41.8 % (ref 38.4–49.9)
HGB: 14 g/dL (ref 13.0–17.1)
LYMPH#: 1.1 10*3/uL (ref 0.9–3.3)
LYMPH%: 22 % (ref 14.0–49.0)
MCH: 33.6 pg — ABNORMAL HIGH (ref 27.2–33.4)
MCHC: 33.6 g/dL (ref 32.0–36.0)
MCV: 99.9 fL — ABNORMAL HIGH (ref 79.3–98.0)
MONO#: 0.5 10*3/uL (ref 0.1–0.9)
MONO%: 9.4 % (ref 0.0–14.0)
NEUT%: 57.6 % (ref 39.0–75.0)
NEUTROS ABS: 2.9 10*3/uL (ref 1.5–6.5)
Platelets: 192 10*3/uL (ref 140–400)
RBC: 4.18 10*6/uL — AB (ref 4.20–5.82)
RDW: 13.4 % (ref 11.0–14.6)
WBC: 5 10*3/uL (ref 4.0–10.3)

## 2014-09-05 LAB — LACTATE DEHYDROGENASE (CC13): LDH: 112 U/L — ABNORMAL LOW (ref 125–245)

## 2014-09-05 MED ORDER — IOHEXOL 300 MG/ML  SOLN
100.0000 mL | Freq: Once | INTRAMUSCULAR | Status: AC | PRN
  Administered 2014-09-05: 80 mL via INTRAVENOUS

## 2014-09-08 LAB — AFP TUMOR MARKER: AFP TUMOR MARKER: 2.5 ng/mL (ref <6.1)

## 2014-09-08 LAB — BETA HCG QUANT (REF LAB): Beta hCG, Tumor Marker: <2 m[IU]/mL (ref <5.0)

## 2014-09-12 ENCOUNTER — Ambulatory Visit (HOSPITAL_BASED_OUTPATIENT_CLINIC_OR_DEPARTMENT_OTHER): Payer: 59 | Admitting: Oncology

## 2014-09-12 ENCOUNTER — Telehealth: Payer: Self-pay | Admitting: Oncology

## 2014-09-12 VITALS — BP 130/82 | HR 62 | Temp 98.2°F | Resp 18 | Ht 72.0 in | Wt 199.7 lb

## 2014-09-12 DIAGNOSIS — Z8547 Personal history of malignant neoplasm of testis: Secondary | ICD-10-CM

## 2014-09-12 DIAGNOSIS — C6290 Malignant neoplasm of unspecified testis, unspecified whether descended or undescended: Secondary | ICD-10-CM

## 2014-09-12 NOTE — Progress Notes (Signed)
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 Initial beta hCG was 258.8, and the initial LDH was 530.   Prior therapy: Chemotherapy with cisplatin and VP-16 was started on January 17, 4080, complicated by severe pancytopenia requiring hospitalizations, blood transfusion and intensive care. Neulasta was given with cycle 1 of treatment.    PET scan carried out on 05/11/2011 continues to show residual hypermetabolic mass although there has been considerable shrinkage. Mr. Bok completed 5 cycles of cisplatin VP-16 chemotherapy as of mid March 2013.   He underwent surgery at [facility] by Dr. Lorine Bears on 06/23/2011. This consisted of a right inguinal radical orchiectomy and retroperitoneal lymphadenectomy. Copies of the pathology report and operative report  indicate that there was no evidence of a viable tumor in any of the resected specimens.  CURRENT THERAPY: Observation, follow-up  INTERVAL HISTORY: Mr. Dugar presents today for a follow-up visit. Since the last visit, he continues to do very well. He remains active and performs activities of daily living without any decline. He does not report any abdominal pain or discomfort. He does not report any pelvic masses or testicular masses.    He does not report any headaches or blurry vision or syncope. Does not report any fevers or chills or sweats. Does not report any chest pain palpitation or orthopnea. Does not report any cough, wheezing or hemoptysis. Does not report a nausea, vomiting, abdominal pain or changes bowel habits. Does not report a frequency, urgency or hesitancy. Rest of his review of systems unremarkable.     ALLERGIES:  has No Known Allergies.  MEDICATIONS: has a current medication list which includes the  following prescription(s): b complex vitamins and simvastatin.  PHYSICAL EXAMINATION: ECOG PERFORMANCE STATUS: 0 - Asymptomatic  Blood pressure 130/82, pulse 62, temperature 98.2 F (36.8 C), temperature source Oral, resp. rate 18, height 6' (1.829 m), weight 199 lb 11.2 oz (90.583 kg), SpO2 100 %.  GENERAL:alert, awake gentleman not in any distress. SKIN: skin color, texture, turgor are normal. EYES: normal, Conjunctiva are pink and non-injected, sclera clear OROPHARYNX:no exudate, no erythema and lips, buccal mucosa, and tongue normal no thrush noted. NECK: supple, thyroid normal size, non-tender, no lymphadenopathy.  LYMPH:  no palpable lymphadenopathy in the cervical, axillary or supraclavicular LUNGS: clear to auscultation and percussion with normal breathing effort HEART: regular rate & rhythm and no murmurs and no lower extremity edema ABDOMEN:abdomen soft, non-tender and normal bowel sounds no shifting dullness or ascites. Musculoskeletal:no cyanosis of digits and no clubbing  NEURO: alert & oriented x 3 with fluent speech, no focal motor/sensory deficits  Labs:  Lab Results  Component Value Date   WBC 5.0 09/05/2014   HGB 14.0 09/05/2014   HCT 41.8 09/05/2014   MCV 99.9* 09/05/2014   PLT 192 09/05/2014   NEUTROABS 2.9 09/05/2014      Chemistry      Component Value Date/Time   NA 139 09/05/2014 0800   NA 138 04/10/2012 1135   K 4.3 09/05/2014 0800   K 4.0 04/10/2012 1135   CL 103 04/10/2012 1135   CL 105 12/13/2011 1137   CO2 21* 09/05/2014 0800   CO2 25 04/10/2012 1135   BUN 24.5 09/05/2014 0800   BUN 16 04/10/2012 1135   CREATININE 1.4* 09/05/2014 0800   CREATININE 1.19 04/10/2012 1135  Component Value Date/Time   CALCIUM 9.2 09/05/2014 0800   CALCIUM 9.4 04/10/2012 1135   ALKPHOS 52 09/05/2014 0800   ALKPHOS 106 04/10/2012 1135   AST 22 09/05/2014 0800   AST 16 04/10/2012 1135   ALT 22 09/05/2014 0800   ALT 10 04/10/2012 1135   BILITOT 0.55  09/05/2014 0800   BILITOT 0.3 04/10/2012 1135     CLINICAL DATA: Right testicular seminoma. Status post surgery and chemotherapy. Restaging.  EXAM: CT ABDOMEN AND PELVIS WITH CONTRAST  TECHNIQUE: Multidetector CT imaging of the abdomen and pelvis was performed using the standard protocol following bolus administration of intravenous contrast.  CONTRAST: 53mL OMNIPAQUE IOHEXOL 300 MG/ML SOLN  COMPARISON: 03/05/2014  FINDINGS: Lower Chest: No acute findings.  Hepatobiliary: No masses or other significant abnormality identified.  Pancreas: No mass, inflammatory changes, or other significant abnormality identified.  Spleen: Within normal limits in size and appearance.  Adrenals: No masses identified.  Kidneys/Urinary Tract: No evidence of masses or hydronephrosis. Chronic right renal parenchymal scarring is stable.  Stomach/Bowel/Peritoneum: No evidence of wall thickening, mass, or obstruction.  Vascular/Lymphatic: No pathologically enlarged lymph nodes identified. Retroperitoneal surgical clips again seen within the abdomen. No abdominal aortic aneurysm or other significant retroperitoneal abnormality demonstrated.  Reproductive: No mass or other significant abnormality identified.  Other: None.  Musculoskeletal: No suspicious bone lesions identified.  IMPRESSION: Stable exam. No evidence of abdominal or pelvic metastatic disease, or other acute findings.  Stable chronic right renal parenchymal scarring.   Electronically Signed  By: Earle Gell M.D.  On: 09/05/2014 09:44      ASSESSMENT:    64 year old gentleman with the following issues:    1. Seminoma of the right testicle: He is status post systemic chemotherapy and surgical resection as outlined above. He has been disease-free over 3 years. His CT scan of the abdomen on 09/05/2014 as well as laboratory testing were reviewed and discussed with the patient today. It  showed no evidence of disease recurrence. The plan is to continue with active surveillance with clinical visit and laboratory testing every 6 months and imaging studies annually.  2. Follow-up. In 6 months for a physical exam and laboratory testing.    [facility], MD 03/07/2014 8:24 AM

## 2014-09-12 NOTE — Telephone Encounter (Signed)
Gave and printed appt sched adn avs for pt for Feb 2017

## 2015-03-13 ENCOUNTER — Ambulatory Visit (HOSPITAL_BASED_OUTPATIENT_CLINIC_OR_DEPARTMENT_OTHER): Payer: 59 | Admitting: Oncology

## 2015-03-13 ENCOUNTER — Telehealth: Payer: Self-pay | Admitting: Oncology

## 2015-03-13 ENCOUNTER — Other Ambulatory Visit (HOSPITAL_BASED_OUTPATIENT_CLINIC_OR_DEPARTMENT_OTHER): Payer: 59

## 2015-03-13 VITALS — BP 132/82 | HR 66 | Temp 98.3°F | Resp 18 | Wt 198.4 lb

## 2015-03-13 DIAGNOSIS — Z8547 Personal history of malignant neoplasm of testis: Secondary | ICD-10-CM | POA: Diagnosis not present

## 2015-03-13 DIAGNOSIS — C6290 Malignant neoplasm of unspecified testis, unspecified whether descended or undescended: Secondary | ICD-10-CM

## 2015-03-13 LAB — COMPREHENSIVE METABOLIC PANEL
ALT: 21 U/L (ref 0–55)
ANION GAP: 10 meq/L (ref 3–11)
AST: 25 U/L (ref 5–34)
Albumin: 4.1 g/dL (ref 3.5–5.0)
Alkaline Phosphatase: 63 U/L (ref 40–150)
BILIRUBIN TOTAL: 0.79 mg/dL (ref 0.20–1.20)
BUN: 22.4 mg/dL (ref 7.0–26.0)
CO2: 24 mEq/L (ref 22–29)
CREATININE: 1.4 mg/dL — AB (ref 0.7–1.3)
Calcium: 10.1 mg/dL (ref 8.4–10.4)
Chloride: 104 mEq/L (ref 98–109)
EGFR: 54 mL/min/{1.73_m2} — AB (ref >90)
Glucose: 97 mg/dl (ref 70–140)
Potassium: 4.6 mEq/L (ref 3.5–5.1)
Sodium: 138 mEq/L (ref 136–145)
Total Protein: 7.3 g/dL (ref 6.4–8.3)

## 2015-03-13 LAB — CBC WITH DIFFERENTIAL/PLATELET
BASO%: 2.1 % — AB (ref 0.0–2.0)
Basophils Absolute: 0.1 10*3/uL (ref 0.0–0.1)
EOS%: 9.8 % — AB (ref 0.0–7.0)
Eosinophils Absolute: 0.5 10*3/uL (ref 0.0–0.5)
HCT: 46.1 % (ref 38.4–49.9)
HGB: 15.4 g/dL (ref 13.0–17.1)
LYMPH%: 20.2 % (ref 14.0–49.0)
MCH: 32.9 pg (ref 27.2–33.4)
MCHC: 33.4 g/dL (ref 32.0–36.0)
MCV: 98.5 fL — AB (ref 79.3–98.0)
MONO#: 0.4 10*3/uL (ref 0.1–0.9)
MONO%: 7.6 % (ref 0.0–14.0)
NEUT%: 60.3 % (ref 39.0–75.0)
NEUTROS ABS: 3.1 10*3/uL (ref 1.5–6.5)
PLATELETS: 212 10*3/uL (ref 140–400)
RBC: 4.68 10*6/uL (ref 4.20–5.82)
RDW: 13.6 % (ref 11.0–14.6)
WBC: 5.2 10*3/uL (ref 4.0–10.3)
lymph#: 1 10*3/uL (ref 0.9–3.3)

## 2015-03-13 LAB — LACTATE DEHYDROGENASE: LDH: 114 U/L — ABNORMAL LOW (ref 125–245)

## 2015-03-13 NOTE — Progress Notes (Signed)
[facility] OFFICE PROGRESS NOTE  Dennis Jacobson, MD 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 Initial beta hCG was 258.8, and the initial LDH was 530.   Prior therapy: Chemotherapy with cisplatin and VP-16 was started on December 10, 0000000, complicated by severe pancytopenia requiring hospitalizations, blood transfusion and intensive care. Neulasta was given with cycle 1 of treatment.    PET scan carried out on 05/11/2011 continues to show residual hypermetabolic mass although there has been considerable shrinkage. [name] completed 5 cycles of cisplatin VP-16 chemotherapy as of mid March 2013.   He underwent surgery at [facility] by Dr. Lorine [name] on 06/23/2011. This consisted of a right inguinal radical orchiectomy and retroperitoneal lymphadenectomy. Copies of the pathology report and operative report  indicate that there was no evidence of a viable tumor in any of the resected specimens.  CURRENT THERAPY: Observation, follow-up  INTERVAL HISTORY: [name] presents today for a follow-up visit. Since the last visit, he no recent complaints. He denied any illnesses or hospitalizations. He continues to feel well without any evidence to suggest relapse disease. He denied any abdominal pain, pelvic pain or lymphadenopathy. He remains active and performs activities of daily living without any decline. Although he is retired, he is involved in a lot of outdoor activities including yardwork and his house and his daughter's house.   He denies any delayed complications related to chemotherapy including peripheral neuropathy, cough or dyspnea on exertion.   He does not report any headaches or blurry vision or syncope. Does not report any fevers or chills or sweats. Does not report any chest pain palpitation or orthopnea. Does not report  any cough, wheezing or hemoptysis. Does not report a nausea, vomiting, abdominal pain or changes bowel habits. Does not report a frequency, urgency or hesitancy. Rest of his review of systems unremarkable.     ALLERGIES:  has No Known Allergies.  MEDICATIONS: has a current medication list which includes the following prescription(s): b complex vitamins and simvastatin.  PHYSICAL EXAMINATION: ECOG PERFORMANCE STATUS: 0 - Asymptomatic  Blood pressure 132/82, pulse 66, temperature 98.3 F (36.8 C), temperature source Oral, resp. rate 18, weight 198 lb 6.4 oz (89.994 kg), SpO2 99 %.  GENERAL:alert, awake without distress. SKIN: skin color, texture, turgor are normal. Rashes or lesions. EYES: normal, Conjunctiva are pink and non-injected, sclera anicteric. OROPHARYNX: No oral ulcers or lesions. NECK: supple, thyroid normal size, non-tender, no lymphadenopathy.  LYMPH:  no palpable lymphadenopathy in the cervical, axillary or supraclavicular LUNGS: clear to auscultation and percussion no wheezes. HEART: regular rate & rhythm and no murmurs and no lower extremity edema ABDOMEN:abdomen soft, non-tender and normal bowel sounds no rebound or guarding. Musculoskeletal:no cyanosis of digits and no clubbing  NEURO: alert & oriented x 3 with fluent speech, no focal motor/sensory deficits  Labs:  Lab Results  Component Value Date   WBC 5.2 03/13/2015   HGB 15.4 03/13/2015   HCT 46.1 03/13/2015   MCV 98.5* 03/13/2015   PLT 212 03/13/2015   NEUTROABS 3.1 03/13/2015      Chemistry      Component Value Date/Time   NA 139 09/05/2014 0800   NA 138 04/10/2012 1135   K 4.3 09/05/2014 0800   K 4.0 04/10/2012 1135   CL 103 04/10/2012 1135   CL 105 12/13/2011 1137   CO2 21* 09/05/2014 0800  CO2 25 04/10/2012 1135   BUN 24.5 09/05/2014 0800   BUN 16 04/10/2012 1135   CREATININE 1.4* 09/05/2014 0800   CREATININE 1.19 04/10/2012 1135      Component Value Date/Time   CALCIUM 9.2 09/05/2014  0800   CALCIUM 9.4 04/10/2012 1135   ALKPHOS 52 09/05/2014 0800   ALKPHOS 106 04/10/2012 1135   AST 22 09/05/2014 0800   AST 16 04/10/2012 1135   ALT 22 09/05/2014 0800   ALT 10 04/10/2012 1135   BILITOT 0.55 09/05/2014 0800   BILITOT 0.3 04/10/2012 1135         ASSESSMENT:    65 year old gentleman with the following issues:    1. Seminoma of the right testicle: He is status post systemic chemotherapy and surgical resection as outlined above. He has been disease-free since his surgery in May 2013.  CT scan of the abdomen on 09/05/2014 as well as laboratory testing  showed no evidence of disease recurrence.   Physical examination and laboratory data today also do not suggest any recurrence.   The plan is to continue with active surveillance with clinical visit and laboratory testing every 6 months and imaging studies annually. He will be due for a CT scan and a chest x-ray and 6 months. The schedule will be done until he is 5 years out from his cancer treatment.  2. Follow-up: In 6 months for a follow-up after laboratory testing and imaging studies.    [facility], MD 03/07/2014 8:21 AM

## 2015-03-13 NOTE — Telephone Encounter (Signed)
Returned call to patient to confirm date/time for Aug appointments.

## 2015-03-13 NOTE — Telephone Encounter (Signed)
Pt confirmed appt on 8/3 for lab/ct/and x-ray and f/u with Shadad 8/10 at 8:30. Wrote out on [employer] [facility] form due to avs not printing

## 2015-03-13 NOTE — Addendum Note (Signed)
Addended by: Randolm Idol on: 03/13/2015 08:44 AM   Modules accepted: Medications

## 2015-03-14 LAB — AFP TUMOR MARKER: AFP, Serum, Tumor Marker: 3.3 ng/mL (ref 0.0–8.3)

## 2015-03-14 LAB — BETA HCG QUANT (REF LAB): hCG Quant: <1 m[IU]/mL (ref 0–3)

## 2015-03-17 LAB — ALPHA FETO PROTEIN (PARALLEL TESTING): AFP-Tumor Marker: 3.2 ng/mL (ref <6.1)

## 2015-03-17 LAB — BETA HCG, QN (PARALLEL TESTING): Beta hCG, Tumor Marker: <2 m[IU]/mL (ref <5.0)

## 2015-09-10 ENCOUNTER — Ambulatory Visit (HOSPITAL_COMMUNITY)
Admission: RE | Admit: 2015-09-10 | Discharge: 2015-09-10 | Disposition: A | Discharge status: Home / Self Care | Payer: Medicare Other | Source: Ambulatory Visit | Attending: Oncology | Admitting: Oncology

## 2015-09-10 ENCOUNTER — Other Ambulatory Visit (HOSPITAL_BASED_OUTPATIENT_CLINIC_OR_DEPARTMENT_OTHER): Payer: Medicare Other

## 2015-09-10 ENCOUNTER — Encounter (HOSPITAL_COMMUNITY): Payer: Self-pay

## 2015-09-10 DIAGNOSIS — C6290 Malignant neoplasm of unspecified testis, unspecified whether descended or undescended: Secondary | ICD-10-CM | POA: Insufficient documentation

## 2015-09-10 DIAGNOSIS — I723 Aneurysm of iliac artery: Secondary | ICD-10-CM | POA: Diagnosis not present

## 2015-09-10 DIAGNOSIS — I700 Atherosclerosis of aorta: Secondary | ICD-10-CM | POA: Diagnosis not present

## 2015-09-10 DIAGNOSIS — N200 Calculus of kidney: Secondary | ICD-10-CM | POA: Diagnosis not present

## 2015-09-10 DIAGNOSIS — Z8547 Personal history of malignant neoplasm of testis: Secondary | ICD-10-CM

## 2015-09-10 LAB — CBC WITH DIFFERENTIAL/PLATELET
BASO%: 1.3 % (ref 0.0–2.0)
Basophils Absolute: 0.1 10*3/uL (ref 0.0–0.1)
EOS ABS: 0.5 10*3/uL (ref 0.0–0.5)
EOS%: 9.7 % — ABNORMAL HIGH (ref 0.0–7.0)
HEMATOCRIT: 43.2 % (ref 38.4–49.9)
HGB: 14.3 g/dL (ref 13.0–17.1)
LYMPH%: 19 % (ref 14.0–49.0)
MCH: 32.9 pg (ref 27.2–33.4)
MCHC: 33.2 g/dL (ref 32.0–36.0)
MCV: 99.3 fL — AB (ref 79.3–98.0)
MONO#: 0.4 10*3/uL (ref 0.1–0.9)
MONO%: 9.2 % (ref 0.0–14.0)
NEUT%: 60.8 % (ref 39.0–75.0)
NEUTROS ABS: 2.9 10*3/uL (ref 1.5–6.5)
Platelets: 193 10*3/uL (ref 140–400)
RBC: 4.35 10*6/uL (ref 4.20–5.82)
RDW: 13.4 % (ref 11.0–14.6)
WBC: 4.8 10*3/uL (ref 4.0–10.3)
lymph#: 0.9 10*3/uL (ref 0.9–3.3)

## 2015-09-10 LAB — COMPREHENSIVE METABOLIC PANEL
ALK PHOS: 55 U/L (ref 40–150)
ALT: 32 U/L (ref 0–55)
ANION GAP: 9 meq/L (ref 3–11)
AST: 28 U/L (ref 5–34)
Albumin: 3.8 g/dL (ref 3.5–5.0)
BILIRUBIN TOTAL: 0.55 mg/dL (ref 0.20–1.20)
BUN: 18.7 mg/dL (ref 7.0–26.0)
CALCIUM: 9.2 mg/dL (ref 8.4–10.4)
CO2: 20 mEq/L — ABNORMAL LOW (ref 22–29)
CREATININE: 1.1 mg/dL (ref 0.7–1.3)
Chloride: 107 mEq/L (ref 98–109)
EGFR: 70 mL/min/{1.73_m2} — AB (ref >90)
Glucose: 94 mg/dl (ref 70–140)
Potassium: 4.6 mEq/L (ref 3.5–5.1)
Sodium: 136 mEq/L (ref 136–145)
TOTAL PROTEIN: 6.8 g/dL (ref 6.4–8.3)

## 2015-09-10 LAB — LACTATE DEHYDROGENASE: LDH: 123 U/L — AB (ref 125–245)

## 2015-09-10 MED ORDER — IOPAMIDOL (ISOVUE-300) INJECTION 61%
100.0000 mL | Freq: Once | INTRAVENOUS | Status: AC | PRN
  Administered 2015-09-10: 100 mL via INTRAVENOUS

## 2015-09-11 LAB — AFP TUMOR MARKER: AFP, SERUM, TUMOR MARKER: 3.6 ng/mL (ref 0.0–8.3)

## 2015-09-11 LAB — BETA HCG QUANT (REF LAB)

## 2015-09-15 LAB — ALPHA FETO PROTEIN (PARALLEL TESTING): AFP TUMOR MARKER: 3.4 ng/mL (ref <6.1)

## 2015-09-15 LAB — BETA HCG, QN (PARALLEL TESTING): Beta hCG, Tumor Marker: <2 m[IU]/mL (ref <5.0)

## 2015-09-17 ENCOUNTER — Ambulatory Visit (HOSPITAL_BASED_OUTPATIENT_CLINIC_OR_DEPARTMENT_OTHER): Payer: Medicare Other | Admitting: Oncology

## 2015-09-17 VITALS — BP 112/68 | HR 65 | Temp 98.3°F | Resp 18 | Ht 72.0 in | Wt 198.9 lb

## 2015-09-17 DIAGNOSIS — Z8547 Personal history of malignant neoplasm of testis: Secondary | ICD-10-CM

## 2015-09-17 DIAGNOSIS — C6290 Malignant neoplasm of unspecified testis, unspecified whether descended or undescended: Secondary | ICD-10-CM

## 2015-09-17 NOTE — Progress Notes (Signed)
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 Initial beta hCG was 258.8, and the initial LDH was 530.   Prior therapy: Chemotherapy with cisplatin and VP-16 was started on December 10, 0000000, complicated by severe pancytopenia requiring hospitalizations, blood transfusion and intensive care. Neulasta was given with cycle 1 of treatment.    PET scan carried out on 05/11/2011 continues to show residual hypermetabolic mass although there has been considerable shrinkage. Dennis [name] completed 5 cycles of cisplatin VP-16 chemotherapy as of mid March 2013.   He underwent surgery at [facility] by Dr. Lorine Bears on 06/23/2011. This consisted of a right inguinal radical orchiectomy and retroperitoneal lymphadenectomy. Pathology report and operative report  indicate that there was no evidence of a viable tumor in any of the resected specimens.  CURRENT THERAPY: Observation, follow-up  INTERVAL HISTORY: Dennis [name] presents today for a follow-up visit. Since the last visit, he continues to do well without recent complications. He denied any abdominal pain, pelvic pain or lymphadenopathy. He remains active and performs activities of daily living without any decline. He does not report any respiratory symptoms such as cough or hemoptysis. He denied any abdominal pain or early satiety. His performance status is unchanged in his quality of life remains excellent. He has no residual complications from chemotherapy.   He does not report any headaches or blurry vision or syncope. Does not report any fevers or chills or sweats. Does not report any chest pain palpitation or orthopnea. Does not report any cough, wheezing or hemoptysis. Does not report a nausea, vomiting, abdominal pain or changes  bowel habits. Does not report a frequency, urgency or hesitancy. Rest of his review of systems unremarkable.     ALLERGIES:  has No Known Allergies.  MEDICATIONS: has a current medication list which includes the following prescription(s): b complex vitamins and simvastatin.  PHYSICAL EXAMINATION: ECOG PERFORMANCE STATUS: 0 - Asymptomatic  Blood pressure 112/68, pulse 65, temperature 98.3 F (36.8 C), temperature source Oral, resp. rate 18, height 6' (1.829 m), weight 198 lb 14.4 oz (90.2 kg), SpO2 99 %.  GENERAL: Well-appearing gentleman without distress. SKIN:  Rashes or lesions. No petechiae or rash. EYES: Conjunctiva are pink and non-injected, sclera anicteric. OROPHARYNX: No oral ulcers or lesions. No oral thrush noted. NECK: supple, thyroid normal size, non-tender, no lymphadenopathy.  LYMPH:  no palpable lymphadenopathy in the cervical, axillary or supraclavicular LUNGS: clear to auscultation and percussion no wheezes. No rhonchi or decrease breath sounds. HEART: regular rate & rhythm and no murmurs and no lower extremity edema ABDOMEN:abdomen soft, non-tender and normal bowel sounds no shifting dullness or ascites. Musculoskeletal:no cyanosis of digits and no clubbing  NEURO: No deficits noted on his exam.  Labs:  Lab Results  Component Value Date   WBC 4.8 09/10/2015   HGB 14.3 09/10/2015   HCT 43.2 09/10/2015   MCV 99.3 (H) 09/10/2015   PLT 193 09/10/2015   NEUTROABS 2.9 09/10/2015      Chemistry      Component Value Date/Time   NA 136 09/10/2015 0750   K 4.6 09/10/2015 0750   CL 103 04/10/2012 1135   CL 105 12/13/2011 1137   CO2 20 (L) 09/10/2015 0750   BUN 18.7 09/10/2015 0750   CREATININE 1.1 09/10/2015 0750      Component Value  Date/Time   CALCIUM 9.2 09/10/2015 0750   ALKPHOS 55 09/10/2015 0750   AST 28 09/10/2015 0750   ALT 32 09/10/2015 0750   BILITOT 0.55 09/10/2015 0750      CLINICAL DATA:  65 year old male with history of testicular  cancer. Followup study.  EXAM: CT ABDOMEN AND PELVIS WITH CONTRAST  TECHNIQUE: Multidetector CT imaging of the abdomen and pelvis was performed using the standard protocol following bolus administration of intravenous contrast.  CONTRAST:  180mL ISOVUE-300 IOPAMIDOL (ISOVUE-300) INJECTION 61%  COMPARISON:  CT the abdomen and pelvis 09/05/2014.  FINDINGS: Lower chest:  Unremarkable.  Hepatobiliary: No cystic or solid hepatic lesions. No intra or extrahepatic biliary ductal dilatation. Gallbladder is normal in appearance.  Pancreas: No pancreatic mass. No pancreatic ductal dilatation. No pancreatic or peripancreatic fluid or inflammatory changes.  Spleen: Unremarkable.  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 2 mm nonobstructive calculus in the upper pole collecting system of the right kidney. Left kidney is normal in appearance. Bilateral adrenal glands are normal in appearance. No hydroureteronephrosis. Urinary bladder is normal in appearance.  Stomach/Bowel: Normal appearance of the stomach. No pathologic dilatation of small bowel or colon. The appendix is not confidently identified and may be surgically absent. Regardless, there are no inflammatory changes noted adjacent to the cecum to suggest the presence of an acute appendicitis at this time.  Vascular/Lymphatic: Aortic atherosclerosis. 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 No lymphadenopathy noted in the abdomen or pelvis. Numerous surgical clips are noted throughout the retroperitoneum, presumably from prior lymph node dissection.  Reproductive: Prostate gland and seminal vesicles are unremarkable in appearance. Status post right orchiectomy.  Other: No significant volume of ascites.  No pneumoperitoneum.  Musculoskeletal: There are no  aggressive appearing lytic or blastic lesions noted in the visualized portions of the skeleton.  IMPRESSION: 1. No findings to suggest metastatic disease in the abdomen or pelvis. Stable postoperative findings, as above. 2. Aortic atherosclerosis. In addition, there is aneurysmal dilatation of the right common iliac artery (1.9 cm in diameter), which is similar to the prior study. 3. 2 mm nonobstructive calculus in the upper pole collecting system of the right kidney. 4. Additional incidental findings, as above.     ASSESSMENT:    65 year old gentleman with the following issues:    1. Seminoma of the right testicle: He presented with stage IIC disease with a retroperitoneal adenopathy. He is status post systemic chemotherapy and surgical resection as outlined above. He has been disease-free since his surgery in May 2013.  His physical examination, laboratory testing from 09/10/2015 as well as CT scan on 09/10/2015 do not suggest recurrent disease. As laboratory data and CT scan were personally reviewed and showed no evidence of disease.  The plan is to continue with active surveillance with physical examination and laboratory testing in 6 months and repeat abdominal imaging and chest x-ray in 12 months. After his next imaging studies, annual visits will be all with he needs.  2. Follow-up: In 6 months for a follow-up after laboratory testing. Marland Kitchen    [facility], MD 03/07/2014 8:42 AM

## 2016-03-15 ENCOUNTER — Telehealth: Payer: Self-pay | Admitting: Oncology

## 2016-03-15 NOTE — Telephone Encounter (Signed)
Attempted to call patient twice and go no answer and there is no machine

## 2016-03-16 ENCOUNTER — Encounter: Payer: Self-pay | Admitting: *Deleted

## 2016-03-16 ENCOUNTER — Telehealth: Payer: Self-pay | Admitting: Oncology

## 2016-03-16 NOTE — Telephone Encounter (Signed)
Spoke with patient re new date and time for lab/fu 2/27.  Appointment rescheduled from 2/9 due to [facility].

## 2016-03-18 ENCOUNTER — Ambulatory Visit: Payer: Medicare Other | Admitting: Oncology

## 2016-03-18 ENCOUNTER — Other Ambulatory Visit: Payer: Medicare Other

## 2016-04-05 ENCOUNTER — Telehealth: Payer: Self-pay | Admitting: Oncology

## 2016-04-05 ENCOUNTER — Other Ambulatory Visit (HOSPITAL_BASED_OUTPATIENT_CLINIC_OR_DEPARTMENT_OTHER): Payer: Medicare Other

## 2016-04-05 ENCOUNTER — Ambulatory Visit (HOSPITAL_BASED_OUTPATIENT_CLINIC_OR_DEPARTMENT_OTHER): Payer: Medicare Other | Admitting: Oncology

## 2016-04-05 VITALS — BP 110/71 | HR 75 | Temp 97.5°F | Resp 18 | Ht 72.0 in | Wt 199.3 lb

## 2016-04-05 DIAGNOSIS — Z8547 Personal history of malignant neoplasm of testis: Secondary | ICD-10-CM

## 2016-04-05 DIAGNOSIS — C6290 Malignant neoplasm of unspecified testis, unspecified whether descended or undescended: Secondary | ICD-10-CM

## 2016-04-05 LAB — CBC WITH DIFFERENTIAL/PLATELET
BASO%: 1.3 % (ref 0.0–2.0)
Basophils Absolute: 0.1 10*3/uL (ref 0.0–0.1)
EOS%: 8 % — AB (ref 0.0–7.0)
Eosinophils Absolute: 0.4 10*3/uL (ref 0.0–0.5)
HCT: 45.5 % (ref 38.4–49.9)
HEMOGLOBIN: 15.7 g/dL (ref 13.0–17.1)
LYMPH%: 20.2 % (ref 14.0–49.0)
MCH: 34.2 pg — ABNORMAL HIGH (ref 27.2–33.4)
MCHC: 34.5 g/dL (ref 32.0–36.0)
MCV: 99 fL — ABNORMAL HIGH (ref 79.3–98.0)
MONO#: 0.4 10*3/uL (ref 0.1–0.9)
MONO%: 8.3 % (ref 0.0–14.0)
NEUT#: 3 10*3/uL (ref 1.5–6.5)
NEUT%: 62.2 % (ref 39.0–75.0)
Platelets: 219 10*3/uL (ref 140–400)
RBC: 4.59 10*6/uL (ref 4.20–5.82)
RDW: 13.7 % (ref 11.0–14.6)
WBC: 4.9 10*3/uL (ref 4.0–10.3)
lymph#: 1 10*3/uL (ref 0.9–3.3)

## 2016-04-05 LAB — COMPREHENSIVE METABOLIC PANEL
ALT: 24 U/L (ref 0–55)
AST: 22 U/L (ref 5–34)
Albumin: 4.2 g/dL (ref 3.5–5.0)
Alkaline Phosphatase: 68 U/L (ref 40–150)
Anion Gap: 9 mEq/L (ref 3–11)
BUN: 17.9 mg/dL (ref 7.0–26.0)
CHLORIDE: 106 meq/L (ref 98–109)
CO2: 23 mEq/L (ref 22–29)
Calcium: 9.9 mg/dL (ref 8.4–10.4)
Creatinine: 1.2 mg/dL (ref 0.7–1.3)
EGFR: 63 mL/min/{1.73_m2} — ABNORMAL LOW (ref >90)
Glucose: 98 mg/dl (ref 70–140)
Potassium: 4.4 mEq/L (ref 3.5–5.1)
SODIUM: 138 meq/L (ref 136–145)
Total Bilirubin: 0.57 mg/dL (ref 0.20–1.20)
Total Protein: 7.5 g/dL (ref 6.4–8.3)

## 2016-04-05 LAB — LACTATE DEHYDROGENASE: LDH: 119 U/L — ABNORMAL LOW (ref 125–245)

## 2016-04-05 NOTE — Progress Notes (Signed)
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 Initial beta hCG was 258.8, and the initial LDH was 530.   Prior therapy: Chemotherapy with cisplatin and VP-16 was started on December 10, 0000000, complicated by severe pancytopenia requiring hospitalizations, blood transfusion and intensive care. Neulasta was given with cycle 1 of treatment.    PET scan carried out on 05/11/2011 continues to show residual hypermetabolic mass although there has been considerable shrinkage. Dennis [name] completed 5 cycles of cisplatin VP-16 chemotherapy as of mid March 2013.   He underwent surgery at [facility] by Dr. Lorine Bears on 06/23/2011. This consisted of a right inguinal radical orchiectomy and retroperitoneal lymphadenectomy. Pathology report and operative report  indicate that there was no evidence of a viable tumor in any of the resected specimens.  CURRENT THERAPY: Observation, follow-up  INTERVAL HISTORY: Dennis [name] presents today for a follow-up visit. Since the last visit, he reports no changes in his health. He remains active and attends activities of daily living.  He denied any abdominal pain, pelvic pain or lymphadenopathy. He does not report any cough or hemoptysis. He does not report any change in his bowel habits. He does not report any inguinal masses or neck adenopathy. He denied any discharge or urination difficulties.   He does not report any headaches or blurry vision or syncope. Does not report any fevers or chills or sweats. Does not report any chest pain palpitation or orthopnea. Does not report any cough, wheezing or hemoptysis. Does not report a nausea, vomiting, abdominal pain or changes bowel habits. Does not report a frequency, urgency or hesitancy. Rest of his review  of systems unremarkable.     ALLERGIES:  has No Known Allergies.  MEDICATIONS: has a current medication list which includes the following prescription(s): b complex vitamins and simvastatin.  PHYSICAL EXAMINATION: ECOG PERFORMANCE STATUS: 0 - Asymptomatic  Blood pressure 110/71, pulse 75, temperature 97.5 F (36.4 C), temperature source Oral, resp. rate 18, height 6' (1.829 m), weight 199 lb 4.8 oz (90.4 kg), SpO2 99 %.  GENERAL: Alert, awake gentleman appeared comfortable. SKIN:  No ecchymosis or rash. EYES: Conjunctiva are pink and non-injected, sclera anicteric. OROPHARYNX: No oral ulcers or lesions. No oral thrush noted. NECK: supple, thyroid normal size, non-tender, no lymphadenopathy.  LYMPH:  no palpable lymphadenopathy in the cervical, axillary or supraclavicular. No inguinal adenopathy. LUNGS: clear to auscultation and percussion no wheezes. No rhonchi or decrease breath sounds. HEART: regular rate & rhythm and no murmurs and no lower extremity edema ABDOMEN:abdomen soft, non-tender and normal bowel sounds no splenomegaly. Musculoskeletal:no cyanosis of digits and no clubbing  NEURO: No deficits noted on his exam.  Labs:  Lab Results  Component Value Date   WBC 4.9 04/05/2016   HGB 15.7 04/05/2016   HCT 45.5 04/05/2016   MCV 99.0 (H) 04/05/2016   PLT 219 04/05/2016   NEUTROABS 3.0 04/05/2016      Chemistry      Component Value Date/Time   NA 136 09/10/2015 0750   K 4.6 09/10/2015 0750   CL 103 04/10/2012 1135   CL 105 12/13/2011 1137   CO2 20 (L) 09/10/2015 0750   BUN 18.7 09/10/2015 0750   CREATININE 1.1 09/10/2015 0750      Component Value Date/Time   CALCIUM 9.2 09/10/2015 0750  ALKPHOS 55 09/10/2015 0750   AST 28 09/10/2015 0750   ALT 32 09/10/2015 0750   BILITOT 0.55 09/10/2015 0750       ASSESSMENT:    66 year old gentleman with the following issues:    1. Seminoma of the right testicle: He presented with stage IIC disease with a  retroperitoneal adenopathy. He is status post systemic chemotherapy and surgical resection as outlined above. He has been disease-free since his surgery in May 2013.  CT scan on 09/10/2015 do not suggest recurrent disease.   His physical examination and laboratory data from today do not suggest any recurrent disease. The plan is to continue on active surveillance and repeat CT scan of the abdomen and a chest x-ray in 6 months. After those imaging studies, he will need annual follow-up with physical examination, laboratory data and chest x-ray only.  2. Follow-up: In 6 months for a follow-up after laboratory testing. Marland Kitchen    [facility], MD 03/07/2014 8:47 AM

## 2016-04-05 NOTE — Telephone Encounter (Signed)
Appointments scheduled per 2/27 LOS. Patient given AVS report, calendars with future scheduled appointments, and two bottles of contrast and instructions for CT scan appointment.

## 2016-04-06 LAB — BETA HCG QUANT (REF LAB)

## 2016-04-06 LAB — AFP TUMOR MARKER: AFP, SERUM, TUMOR MARKER: 3.1 ng/mL (ref 0.0–8.3)

## 2016-09-07 ENCOUNTER — Encounter (HOSPITAL_COMMUNITY): Payer: Self-pay

## 2016-09-07 ENCOUNTER — Other Ambulatory Visit (HOSPITAL_BASED_OUTPATIENT_CLINIC_OR_DEPARTMENT_OTHER): Payer: Medicare Other

## 2016-09-07 ENCOUNTER — Ambulatory Visit (HOSPITAL_COMMUNITY)
Admission: RE | Admit: 2016-09-07 | Discharge: 2016-09-07 | Disposition: A | Discharge status: Home / Self Care | Payer: Medicare Other | Source: Ambulatory Visit | Attending: Oncology | Admitting: Oncology

## 2016-09-07 DIAGNOSIS — I723 Aneurysm of iliac artery: Secondary | ICD-10-CM | POA: Diagnosis not present

## 2016-09-07 DIAGNOSIS — C6290 Malignant neoplasm of unspecified testis, unspecified whether descended or undescended: Secondary | ICD-10-CM | POA: Insufficient documentation

## 2016-09-07 DIAGNOSIS — Z8547 Personal history of malignant neoplasm of testis: Secondary | ICD-10-CM

## 2016-09-07 DIAGNOSIS — I700 Atherosclerosis of aorta: Secondary | ICD-10-CM | POA: Insufficient documentation

## 2016-09-07 LAB — COMPREHENSIVE METABOLIC PANEL
ALT: 66 U/L — AB (ref 0–55)
AST: 41 U/L — AB (ref 5–34)
Albumin: 3.8 g/dL (ref 3.5–5.0)
Alkaline Phosphatase: 54 U/L (ref 40–150)
Anion Gap: 9 mEq/L (ref 3–11)
BUN: 28 mg/dL — AB (ref 7.0–26.0)
CALCIUM: 9.8 mg/dL (ref 8.4–10.4)
CHLORIDE: 102 meq/L (ref 98–109)
CO2: 26 meq/L (ref 22–29)
Creatinine: 1.3 mg/dL (ref 0.7–1.3)
EGFR: 57 mL/min/{1.73_m2} — ABNORMAL LOW (ref >90)
Glucose: 91 mg/dl (ref 70–140)
POTASSIUM: 4.3 meq/L (ref 3.5–5.1)
SODIUM: 137 meq/L (ref 136–145)
Total Bilirubin: 0.96 mg/dL (ref 0.20–1.20)
Total Protein: 6.9 g/dL (ref 6.4–8.3)

## 2016-09-07 LAB — CBC WITH DIFFERENTIAL/PLATELET
BASO%: 0.9 % (ref 0.0–2.0)
Basophils Absolute: 0.1 10*3/uL (ref 0.0–0.1)
EOS%: 5.2 % (ref 0.0–7.0)
Eosinophils Absolute: 0.4 10*3/uL (ref 0.0–0.5)
HEMATOCRIT: 46.4 % (ref 38.4–49.9)
HEMOGLOBIN: 15.9 g/dL (ref 13.0–17.1)
LYMPH%: 16.9 % (ref 14.0–49.0)
MCH: 34 pg — AB (ref 27.2–33.4)
MCHC: 34.3 g/dL (ref 32.0–36.0)
MCV: 99.1 fL — ABNORMAL HIGH (ref 79.3–98.0)
MONO#: 0.8 10*3/uL (ref 0.1–0.9)
MONO%: 9.7 % (ref 0.0–14.0)
NEUT#: 5.2 10*3/uL (ref 1.5–6.5)
NEUT%: 67.3 % (ref 39.0–75.0)
Platelets: 184 10*3/uL (ref 140–400)
RBC: 4.68 10*6/uL (ref 4.20–5.82)
RDW: 14 % (ref 11.0–14.6)
WBC: 7.7 10*3/uL (ref 4.0–10.3)
lymph#: 1.3 10*3/uL (ref 0.9–3.3)

## 2016-09-07 LAB — LACTATE DEHYDROGENASE: LDH: 148 U/L (ref 125–245)

## 2016-09-07 MED ORDER — IOPAMIDOL (ISOVUE-300) INJECTION 61%
INTRAVENOUS | Status: AC
  Filled 2016-09-07: qty 100

## 2016-09-07 MED ORDER — IOPAMIDOL (ISOVUE-300) INJECTION 61%
100.0000 mL | Freq: Once | INTRAVENOUS | Status: AC | PRN
  Administered 2016-09-07: 100 mL via INTRAVENOUS

## 2016-09-08 LAB — AFP TUMOR MARKER: AFP, SERUM, TUMOR MARKER: 4.8 ng/mL (ref 0.0–8.3)

## 2016-09-08 LAB — BETA HCG QUANT (REF LAB)

## 2016-09-14 ENCOUNTER — Ambulatory Visit (HOSPITAL_BASED_OUTPATIENT_CLINIC_OR_DEPARTMENT_OTHER): Payer: Medicare Other | Admitting: Oncology

## 2016-09-14 ENCOUNTER — Telehealth: Payer: Self-pay | Admitting: Oncology

## 2016-09-14 VITALS — BP 105/75 | HR 71 | Temp 98.7°F | Resp 18 | Ht 72.0 in | Wt 194.2 lb

## 2016-09-14 DIAGNOSIS — Z8547 Personal history of malignant neoplasm of testis: Secondary | ICD-10-CM

## 2016-09-14 DIAGNOSIS — C6290 Malignant neoplasm of unspecified testis, unspecified whether descended or undescended: Secondary | ICD-10-CM

## 2016-09-14 NOTE — Telephone Encounter (Signed)
Gave patient avs and calendar for next year.

## 2016-09-14 NOTE — Progress Notes (Signed)
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 Initial beta hCG was 258.8, and the initial LDH was 530.   Prior therapy: Chemotherapy with cisplatin and VP-16 was started on January 17, 6044, complicated by severe pancytopenia requiring hospitalizations, blood transfusion and intensive care. Neulasta was given with cycle 1 of treatment.    PET scan carried out on 05/11/2011 continues to show residual hypermetabolic mass although there has been considerable shrinkage. Dennis [name] of cisplatin VP-16 chemotherapy as of mid March 2013.   He underwent surgery at [facility] by Dr. Lorine Bears on 06/23/2011. This consisted of a right inguinal radical orchiectomy and retroperitoneal lymphadenectomy. Pathology report and operative report  indicate that there was no evidence of a viable tumor in any of the resected specimens.  CURRENT THERAPY: Observation, follow-up  INTERVAL HISTORY: Dennis [name]. Since the last [name], he reports doing well without any recent complaints. He denied any abdominal pain or change in his bowel habits. He denies any shortness of breath or difficulty breathing. He remains active and attends activities of daily living.  He denied any abdominal pain, pelvic pain or lymphadenopathy. He does not report any cough or hemoptysis. He continues to smoke despite the recommendation against it.   He does not report any headaches or blurry vision or syncope. Does not report any fevers or chills or sweats. Does not report any chest pain palpitation or orthopnea. Does not report any cough, wheezing or hemoptysis. Does not report a nausea, vomiting, abdominal pain or changes bowel habits. Does not report a frequency, urgency or  hesitancy. Rest of his review of systems unremarkable.     ALLERGIES:  has No Known Allergies.  MEDICATIONS: has a current medication list which includes the following prescription(s): b complex vitamins and simvastatin.  PHYSICAL EXAMINATION: ECOG PERFORMANCE STATUS: 0 - Asymptomatic  Blood pressure 105/75, pulse 71, temperature 98.7 F (37.1 C), temperature source Oral, resp. rate 18, height 6' (1.829 m), weight 194 lb 3.2 oz (88.1 kg), SpO2 98 %.  GENERAL: Well-appearing gentleman without distress. SKIN: Skin rashes or lesions. EYES: Conjunctiva are pink and non-injected, sclera anicteric. OROPHARYNX: No oral ulcers or lesions. No oral thrush noted. NECK: supple, thyroid normal size, non-tender, no lymphadenopathy.  LYMPH:  No adenopathy palpated. LUNGS: clear to auscultation and percussion no wheezes. No rhonchi or decrease breath sounds. HEART: regular rate & rhythm and no murmurs and no lower extremity edema ABDOMEN:abdomen soft, non-tender and normal bowel sounds no splenomegaly. Musculoskeletal:no cyanosis of digits and no clubbing  NEURO: No deficits noted on his exam.  Labs:  Lab Results  Component Value Date   WBC 7.7 09/07/2016   HGB 15.9 09/07/2016   HCT 46.4 09/07/2016   MCV 99.1 (H) 09/07/2016   PLT 184 09/07/2016   NEUTROABS 5.2 09/07/2016      Chemistry      Component Value Date/Time   NA 137 09/07/2016 0739   K 4.3 09/07/2016 0739   CL 103 04/10/2012 1135   CL 105 12/13/2011 1137   CO2 26 09/07/2016 0739   BUN 28.0 (H) 09/07/2016 0739   CREATININE 1.3 09/07/2016 0739      Component Value Date/Time   CALCIUM 9.8 09/07/2016 0739   ALKPHOS 54 09/07/2016 0739   AST 41 (H)  09/07/2016 0739   ALT 66 (H) 09/07/2016 0739   BILITOT 0.96 09/07/2016 0739      EXAM: CT ABDOMEN AND PELVIS WITH CONTRAST  TECHNIQUE: Multidetector CT imaging of the abdomen and pelvis was performed using the standard protocol following bolus administration  of intravenous contrast.  CONTRAST:  146mL ISOVUE-300 IOPAMIDOL (ISOVUE-300) INJECTION 61%  COMPARISON:  09/10/2015; 09/05/2014; 09/03/2013  FINDINGS: Lower chest: Limited visualization of the lower thorax is negative for focal airspace opacity or pleural effusion.  Normal heart size.  No pericardial effusion.  Hepatobiliary: Normal hepatic contour. No discrete hepatic lesions. Normal appearance of the gallbladder given degree distention. No radiopaque gallstones. No intra extrahepatic bili duct dilatation. No ascites.  Pancreas: Normal appearance of the pancreas  Spleen: Normal appearance of the spleen. Note is again made of a punctate splenule.  Adrenals/Urinary Tract: There is symmetric enhancement and excretion of the bilateral kidneys. Re- demonstrated geographic atrophy involving in the posterior inferior aspects of the right kidney presumably the sequela of prior infarct. No definite renal stones on this postcontrast examination. No discrete renal lesions. No urine obstruction or perinephric stranding.  Normal appearance of the bilateral adrenal glands.  Normal appearance of the urinary bladder given underdistention.  Stomach/Bowel: Ingested enteric contrast extends to the level the rectum. No evidence of enteric obstruction. The bowel is normal in course and caliber without wall thickening. Normal appearance of the terminal ileum. The appendix is not visualized compatible provided operative history. No pneumoperitoneum, pneumatosis or portal venous gas.  Vascular/Lymphatic: Moderate to large amount of mixed calcified and noncalcified atherosclerotic plaque within a normal caliber abdominal aorta. Unchanged fusiform aneurysmal dilatation of the right common iliac artery measuring approximately 2 cm in diameter (image 49, series 2) similar to the 08/2013 examination.  Post bilateral retroperitoneal lymph node dissection. Solitary prominent  approximately 0.6 cm) mesenteric lymph node within the right mid/ lower abdomen (image 42, series 2) appear similar to the 08/2013 examination is to resume emboli reactive etiology. No bulky retroperitoneal, mesenteric, pelvic or inguinal lymphadenopathy.  Reproductive: Sequela of prior right orchectomy. Normal appearance of the prostate gland. No free fluid in the pelvic cul-de-sac.  Other: Regional soft tissues appear normal.  Musculoskeletal: No acute or aggressive osseous abnormalities. 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 Bilateral facet degenerative change within the lower lumbar spine.  IMPRESSION: 1. Stable examination without evidence of metastatic disease within the abdomen or pelvis. 2.  Aortic Atherosclerosis (ICD10-I70.0). 3. Geographic atrophy involving the mid and inferior aspects of the right kidney, presumably the sequela of prior vascular insult. 4. 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    ASSESSMENT:    66 year old gentleman with the following issues:    1. Seminoma of the right testicle: He presented with stage IIC disease with a retroperitoneal adenopathy. He is status post systemic chemotherapy and surgical resection as outlined above. He has been disease-free since his surgery in May 2013.  CT scan obtained on 09/07/2016 as well as laboratory testing review today showed a stable examination without any evidence of recurrent disease.  The plan is to continue with active surveillance at this time with annual visits including physical examination, laboratory testing and a chest x-ray.  2. Age-appropriate cancer screening: We have discussed the need for a colonoscopy in the future. He is scheduled to have that done in the near future.  3. Follow-up: In 12 months.  [facility], Dennis [name] 03/07/2014 9:39  AM

## 2017-02-16 ENCOUNTER — Other Ambulatory Visit: Payer: Self-pay

## 2017-02-16 DIAGNOSIS — I723 Aneurysm of iliac artery: Secondary | ICD-10-CM

## 2017-03-22 ENCOUNTER — Ambulatory Visit (HOSPITAL_COMMUNITY)
Admission: RE | Admit: 2017-03-22 | Discharge: 2017-03-22 | Disposition: A | Discharge status: Home / Self Care | Payer: Medicare Other | Source: Ambulatory Visit | Attending: Vascular Surgery | Admitting: Vascular Surgery

## 2017-03-22 ENCOUNTER — Ambulatory Visit (INDEPENDENT_AMBULATORY_CARE_PROVIDER_SITE_OTHER): Payer: Medicare Other | Admitting: Vascular Surgery

## 2017-03-22 ENCOUNTER — Encounter: Payer: Self-pay | Admitting: Vascular Surgery

## 2017-03-22 ENCOUNTER — Other Ambulatory Visit: Payer: Self-pay

## 2017-03-22 VITALS — BP 116/80 | HR 59 | Temp 97.4°F | Resp 18 | Ht 70.0 in | Wt 192.0 lb

## 2017-03-22 DIAGNOSIS — I723 Aneurysm of iliac artery: Secondary | ICD-10-CM

## 2017-03-22 NOTE — Progress Notes (Signed)
Patient name: Dennis [name] MRN: [id number] DOB: Oct 16, 1950 Sex: male   REASON FOR CONSULT:    Right Common iliac artery aneurysm.  The consult is requested by Dr. Lorene Dy  HPI:   Dennis [name] is a pleasant 67 y.o. male, with a history of testicular cancer.  He is undergone previous surgery for this.  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  He is sent for vascular consultation.  He is unaware of any family history of aneurysmal disease.  He denies any abdominal pain or back pain.  He does smoke 1 pack/day of cigarettes and has been smoking for many years.  He denies any history of claudication, rest pain, or nonhealing ulcers.  He denies any history of deep venous thrombosis.  Past Medical History:  Diagnosis Date  . Cancer [facility]) dx'd 12/2010   right testicle, behind stomach    History reviewed. No pertinent family history.  There is no family history of premature cardiovascular disease.  SOCIAL HISTORY: He smokes 1 pack/day of cigarettes.  He has been smoking for as long as he can remember. Social History   Socioeconomic History  . Marital status: Divorced    Spouse name: Not on file  . Number of children: Not on file  . Years of education: Not on file  . Highest education level: Not on file  Social Needs  . Financial resource strain: Not on file  . Food insecurity - worry: Not on file  . Food insecurity - inability: Not on file  . Transportation needs - medical: Not on file  . Transportation needs - non-medical: Not on file  Occupational History  . Not on file  Tobacco Use  . Smoking status: Current Every Day Smoker    Packs/day: 1.00    Types: Cigarettes  . Smokeless tobacco: Never Used  Substance and Sexual Activity  . Alcohol use: Yes    Alcohol/week: 1.0 oz    Types: 2 drink(s) per week    Comment: last etoh was 3 weeks ago  . Drug use: No  . Sexual  activity: Not on file  Other Topics Concern  . Not on file  Social History Narrative  . Not on file    No Known Allergies  Current Outpatient Medications  Medication Sig Dispense Refill  . b complex vitamins capsule Take 1 capsule by mouth daily.    . simvastatin (ZOCOR) 20 MG tablet Take 20 mg by mouth every evening.     No current facility-administered medications for this visit.     REVIEW OF SYSTEMS:  [X]  denotes positive finding, [ ]  denotes negative finding Cardiac  Comments:  Chest pain or chest pressure:    Shortness of breath upon exertion:    Short of breath when lying flat:    Irregular heart rhythm:        Vascular    Pain in calf, thigh, or hip brought on by ambulation:    Pain in feet at night that wakes you up from your sleep:     Blood clot in your veins:    Leg swelling:         Pulmonary    Oxygen at home:    Productive cough:     Wheezing:         Neurologic    Sudden weakness in arms or legs:  Sudden numbness in arms or legs:     Sudden onset of difficulty speaking or slurred speech:    Temporary loss of vision in one eye:     Problems with dizziness:         Gastrointestinal    Blood in stool:     Vomited blood:         Genitourinary    Burning when urinating:     Blood in urine:        Psychiatric    Major depression:         Hematologic    Bleeding problems:    Problems with blood clotting too easily:        Skin    Rashes or ulcers:        Constitutional    Fever or chills:     PHYSICAL EXAM:   Vitals:   03/22/17 0842  BP: 116/80  Pulse: (!) 59  Resp: 18  Temp: (!) 97.4 F (36.3 C)  SpO2: 100%  Weight: 192 lb (87.1 kg)  Height: 5\' 10"  (1.778 m)    GENERAL: The patient is a well-nourished male, in no acute distress. The vital signs are documented above. CARDIAC: There is a regular rate and rhythm.  VASCULAR: I do not detect carotid bruits. On the right side, he has a palpable femoral, popliteal, and posterior  tibial pulse. On the left side he has a palpable femoral, popliteal, dorsalis pedis, and posterior tibial pulse. He has no significant lower extremity swelling. PULMONARY: There is good air exchange bilaterally without wheezing or rales. ABDOMEN: Soft and non-tender with normal pitched bowel sounds.  I do not palpate an abdominal aortic aneurysm. MUSCULOSKELETAL: There are no major deformities or cyanosis. NEUROLOGIC: No focal weakness or paresthesias are detected. SKIN: There are no ulcers or rashes noted. PSYCHIATRIC: The patient has a normal affect.  DATA:    CT ABDOMEN PELVIS: I reviewed the images of his CT of the abdomen and pelvis that was done in August 2018.  The maximum diameter of the infrarenal aorta was noted to be 1.6 cm.  The right common iliac artery measured 1.9 cm in maximum diameter.  There was no evidence of metastatic disease in the abdomen or pelvis related to his previous cancer.  DUPLEX ABDOMINAL AORTA: I have independently interpreted the duplex of his abdominal aorta and iliac arteries.  The infrarenal aorta measures 1.6 cm in maximum diameter.  The right common iliac artery measures 1.96 cm in maximum diameter.  The left common iliac artery measures 1.2 cm in maximum diameter.  MEDICAL ISSUES:   ASYMPTOMATIC 2 CM RIGHT COMMON ILIAC ARTERY ANEURYSM: I have explained that we would only consider elective repair in a normal risk patient for a 3.5 cm common iliac artery aneurysm.  His aneurysm is quite small and has been stable for some time.  CT scan in 2015 showed that this was 1.9 cm at that time also.  I have had a long discussion with him about the importance of tobacco cessation.  He understands that continued tobacco use does increase his risk of continued aneurysm expansion and rupture.  Given the small size of the aneurysm I think it would be reasonable to do a follow-up duplex in 2 years and I will see him back at that time.  We have also discussed the importance of  good blood pressure control.  I will see him in 2 years.  He knows to call sooner if he has problems.  Deitra Mayo [facility] [phone]

## 2017-09-07 ENCOUNTER — Inpatient Hospital Stay: Payer: Medicare Other | Attending: Oncology

## 2017-09-07 DIAGNOSIS — Z9079 Acquired absence of other genital organ(s): Secondary | ICD-10-CM | POA: Insufficient documentation

## 2017-09-07 DIAGNOSIS — Z8547 Personal history of malignant neoplasm of testis: Secondary | ICD-10-CM | POA: Diagnosis not present

## 2017-09-07 DIAGNOSIS — C6290 Malignant neoplasm of unspecified testis, unspecified whether descended or undescended: Secondary | ICD-10-CM

## 2017-09-07 DIAGNOSIS — N289 Disorder of kidney and ureter, unspecified: Secondary | ICD-10-CM | POA: Insufficient documentation

## 2017-09-07 DIAGNOSIS — Z9221 Personal history of antineoplastic chemotherapy: Secondary | ICD-10-CM | POA: Insufficient documentation

## 2017-09-07 LAB — CBC WITH DIFFERENTIAL/PLATELET
Basophils Absolute: 0.1 10*3/uL (ref 0.0–0.1)
Basophils Relative: 1 %
EOS PCT: 6 %
Eosinophils Absolute: 0.4 10*3/uL (ref 0.0–0.5)
HCT: 43.2 % (ref 38.4–49.9)
Hemoglobin: 14.7 g/dL (ref 13.0–17.1)
LYMPHS ABS: 1.1 10*3/uL (ref 0.9–3.3)
LYMPHS PCT: 19 %
MCH: 33.7 pg — AB (ref 27.2–33.4)
MCHC: 34 g/dL (ref 32.0–36.0)
MCV: 99.1 fL — AB (ref 79.3–98.0)
Monocytes Absolute: 0.7 10*3/uL (ref 0.1–0.9)
Monocytes Relative: 12 %
Neutro Abs: 3.5 10*3/uL (ref 1.5–6.5)
Neutrophils Relative %: 62 %
Platelets: 186 10*3/uL (ref 140–400)
RBC: 4.36 MIL/uL (ref 4.20–5.82)
RDW: 13.3 % (ref 11.0–14.6)
WBC: 5.7 10*3/uL (ref 4.0–10.3)

## 2017-09-07 LAB — COMPREHENSIVE METABOLIC PANEL
ALBUMIN: 4.4 g/dL (ref 3.5–5.0)
ALT: 25 U/L (ref 0–44)
AST: 26 U/L (ref 15–41)
Alkaline Phosphatase: 61 U/L (ref 38–126)
Anion gap: 11 (ref 5–15)
BUN: 39 mg/dL — AB (ref 8–23)
CHLORIDE: 102 mmol/L (ref 98–111)
CO2: 24 mmol/L (ref 22–32)
Calcium: 10 mg/dL (ref 8.9–10.3)
Creatinine, Ser: 2.29 mg/dL — ABNORMAL HIGH (ref 0.61–1.24)
GFR calc Af Amer: 32 mL/min — ABNORMAL LOW (ref >60)
GFR calc non Af Amer: 28 mL/min — ABNORMAL LOW (ref >60)
GLUCOSE: 92 mg/dL (ref 70–99)
POTASSIUM: 3.9 mmol/L (ref 3.5–5.1)
SODIUM: 137 mmol/L (ref 135–145)
Total Bilirubin: 0.6 mg/dL (ref 0.3–1.2)
Total Protein: 7.5 g/dL (ref 6.5–8.1)

## 2017-09-07 LAB — LACTATE DEHYDROGENASE: LDH: 129 U/L (ref 98–192)

## 2017-09-08 LAB — AFP TUMOR MARKER: AFP, Serum, Tumor Marker: 2.3 ng/mL (ref 0.0–8.3)

## 2017-09-08 LAB — BETA HCG QUANT (REF LAB)

## 2017-09-14 ENCOUNTER — Inpatient Hospital Stay: Payer: Medicare Other | Admitting: Oncology

## 2017-09-14 VITALS — BP 130/79 | HR 65 | Temp 98.5°F | Resp 17 | Ht 70.0 in | Wt 191.9 lb

## 2017-09-14 DIAGNOSIS — Z9221 Personal history of antineoplastic chemotherapy: Secondary | ICD-10-CM

## 2017-09-14 DIAGNOSIS — C6290 Malignant neoplasm of unspecified testis, unspecified whether descended or undescended: Secondary | ICD-10-CM

## 2017-09-14 DIAGNOSIS — N289 Disorder of kidney and ureter, unspecified: Secondary | ICD-10-CM | POA: Diagnosis not present

## 2017-09-14 DIAGNOSIS — Z8547 Personal history of malignant neoplasm of testis: Secondary | ICD-10-CM

## 2017-09-14 DIAGNOSIS — Z9079 Acquired absence of other genital organ(s): Secondary | ICD-10-CM | POA: Diagnosis not present

## 2017-09-14 NOTE — Progress Notes (Signed)
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  He had elevated tumor markers with beta hCG was 258.8, and the initial LDH was 530.   Prior therapy: Chemotherapy with cisplatin and VP-16 was started on January 17, 2011.  He completed 6 cycles of therapy in March 2013.  He status post right inguinal radical orchiectomy and retroperitoneal lymphadenectomy. Pathology indicate that there was no evidence of a viable tumor in any of the resected specimens this was completed at [facility] by Dr. Lorine Bears on 06/23/2011  CURRENT THERAPY: Active surveillance:  INTERVAL HISTORY: Mr. Begue for a follow-up visit.  Since the last visit, he reports no changes in his health.  He continues to be active and attends activities of daily living.  He denies any abdominal distention or early satiety.  He denies any adenopathy or dyspnea on exertion.  He is urinating regularly without any hematuria or dysuria.  He denies any flank pain.   He does not report any headaches or blurry vision or syncope.  He denies any syncope or seizures.  Does not report any fevers or chills or sweats. Does not report any chest pain palpitation or orthopnea. Does not report any cough, wheezing or hemoptysis. Does not report a nausea, vomiting.  He denies any hematochezia or melena.  Denies any constipation or diarrhea.  Does not report a frequency, urgency or hesitancy.  He denies any lymphadenopathy or petechiae.  Rest of his review of systems is negative.     ALLERGIES:  has No Known Allergies.  MEDICATIONS: has a current medication list which includes the following prescription(s): b complex vitamins and simvastatin.  PHYSICAL EXAMINATION: ECOG PERFORMANCE STATUS: 0 - Asymptomatic  Blood pressure 130/79, pulse 65, temperature  98.5 F (36.9 C), temperature source Oral, resp. rate 17, height 5\' 10"  (1.778 m), weight 191 lb 14.4 oz (87 kg), SpO2 98 %.   GENERAL: Alert, awake gentleman without distress. SKIN: No ecchymosis or petechiae. EYES: Pupils are equal and round reactive to light. OROPHARYNX: Mucous membranes are moist and pink. LYMPH:  No cervical, axillary or inguinal adenopathy. LUNGS: clear without any rhonchi, wheezes or dullness to percussion. HEART: regular rate & rhythm.  S1-S2 without leg edema. ABDOMEN: Any rebound or guarding. Musculoskeletal: Joint deformity or effusion. NEURO: Intact motor and sensory exam.  Labs:  Lab Results  Component Value Date   WBC 5.7 09/07/2017   HGB 14.7 09/07/2017   HCT 43.2 09/07/2017   MCV 99.1 (H) 09/07/2017   PLT 186 09/07/2017   NEUTROABS 3.5 09/07/2017      Chemistry      Component Value Date/Time   NA 137 09/07/2017 0802   NA 137 09/07/2016 0739   K 3.9 09/07/2017 0802   K 4.3 09/07/2016 0739   CL 102 09/07/2017 0802   CL 105 12/13/2011 1137   CO2 24 09/07/2017 0802   CO2 26 09/07/2016 0739   BUN 39 (H) 09/07/2017 0802   BUN 28.0 (H) 09/07/2016 0739   CREATININE 2.29 (H) 09/07/2017 0802   CREATININE 1.3 09/07/2016 0739      Component Value Date/Time   CALCIUM 10.0 09/07/2017 0802   CALCIUM 9.8 09/07/2016 0739   ALKPHOS 61 09/07/2017 0802   ALKPHOS 54 09/07/2016 0739   AST 26 09/07/2017 0802   AST 41 (H) 09/07/2016 0739   ALT  25 09/07/2017 0802   ALT 66 (H) 09/07/2016 0739   BILITOT 0.6 09/07/2017 0802   BILITOT 0.96 09/07/2016 0739       ASSESSMENT:    67 year old man:    1.  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  He completed definitive therapy at the time with chemotherapy utilizing etoposide and cisplatin followed by surgical resection.  He remains on active surveillance and he is close to 6 years out without any evidence of recurrent disease.  The natural course of this disease and laboratory data  were reviewed today in detail.  His tumor markers all within normal range and no abnormalities on his physical exam.  I have recommended continued observation and surveillance at this time on an annual basis to at least complete 10 years.  2.  Renal insufficiency: This is a new finding with his BUN is up to 39 and a creatinine up to 2.29.  He is asymptomatic from this finding and does not take any medication.  To work this up, we will obtain a kidney ultrasound and consider referral to nephrology.  I offered him referral to this time but he wanted to defer that until he discuss it with his primary care physician.  If there is no obstruction acted on his ultrasound, then a nephrology work-up may be needed.  3. Follow-up: In 1 year sooner if abnormalities detected on his upcoming ultrasound.  15  minutes was spent with the patient face-to-face today.  More than 50% of time was dedicated to patient counseling, discussing his laboratory data and future plan of care.     Zola Button, MD 03/07/2014 8:23 AM

## 2017-09-15 ENCOUNTER — Telehealth: Payer: Self-pay | Admitting: Oncology

## 2017-09-15 NOTE — Telephone Encounter (Signed)
Returned call to patient and confirmed with patient lab/fu appointments for August 2020.

## 2018-02-26 IMAGING — CT CT ABD-PELV W/ CM
2 of 5 series · 15 of 46 positions shown, 17 images · IV contrast (APPLIED)
Comparison: 09/10/2015; 09/05/2014; 09/03/2013

CLINICAL DATA: Testicular cancer diagnosed in 7497 with orchectomy.

EXAM:
CT ABDOMEN AND PELVIS WITH CONTRAST
TECHNIQUE: Multidetector CT imaging of the abdomen and pelvis was performed
using the standard protocol following bolus administration of
intravenous contrast.
CONTRAST:  100mL 40KSDO-4PP IOPAMIDOL (40KSDO-4PP) INJECTION 61%

[Series 2: axial st · axial · 0.84mm/px · z∈[+1019,+1439]mm · 12 of 98 slices shown, 14 images]
[im 7/98  soft-tissue]
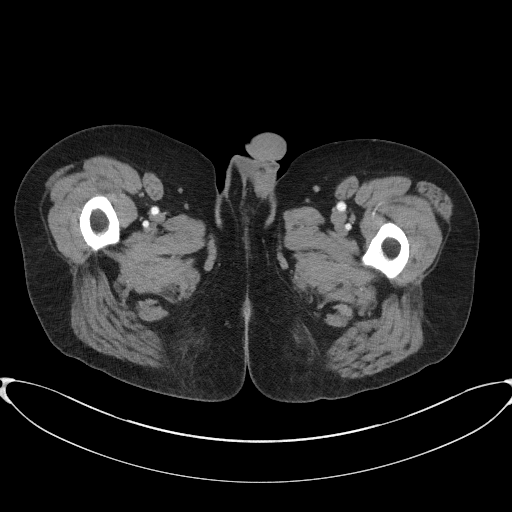
[im 7/98  bone]
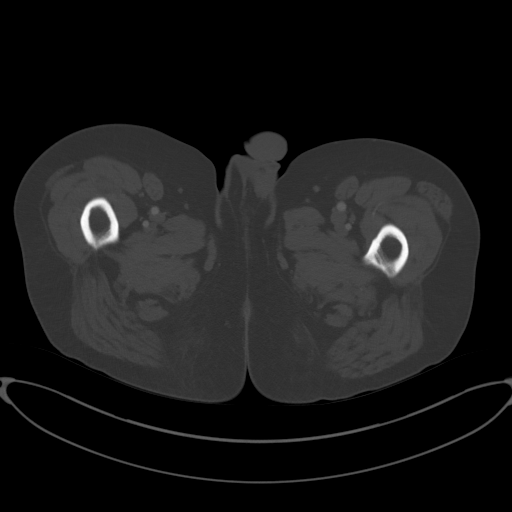
[im 13/98  soft-tissue]
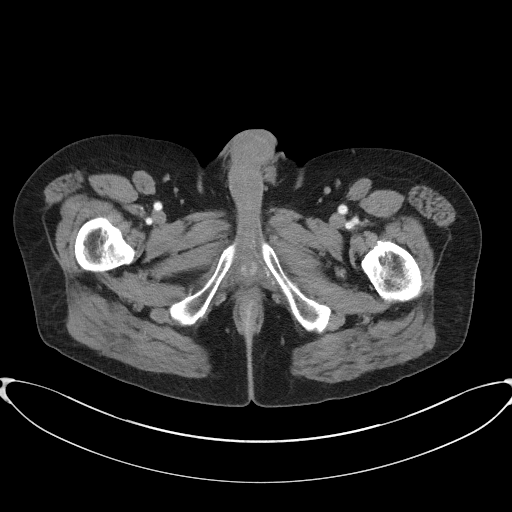
[im 25/98  soft-tissue]
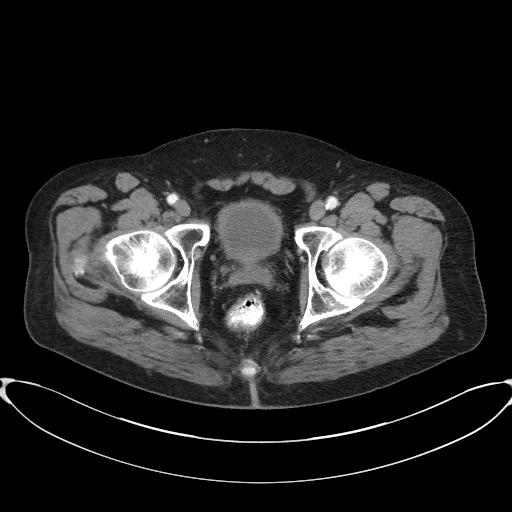
[im 31/98  soft-tissue]
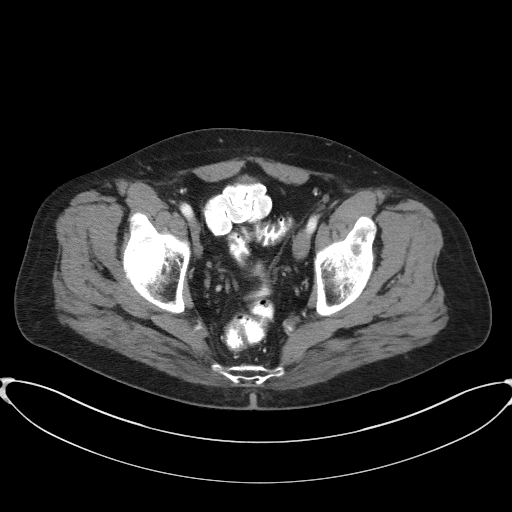
[im 37/98  soft-tissue]
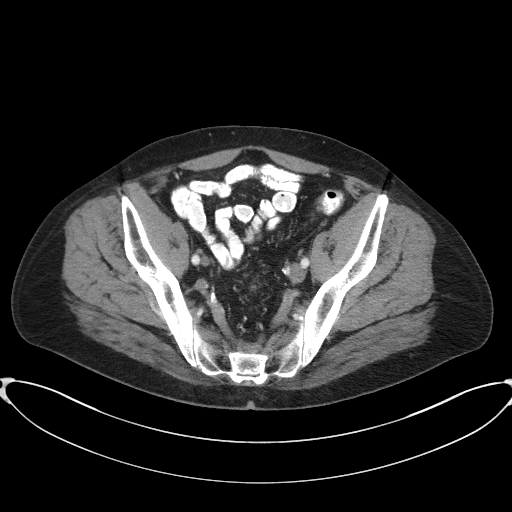
[im 43/98  soft-tissue]
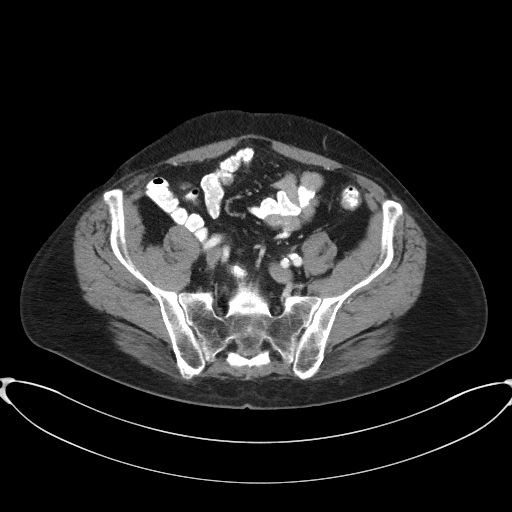
[im 55/98  soft-tissue]
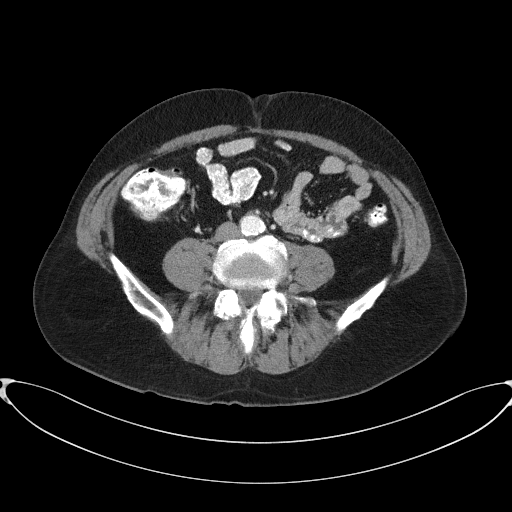
[im 61/98  soft-tissue]
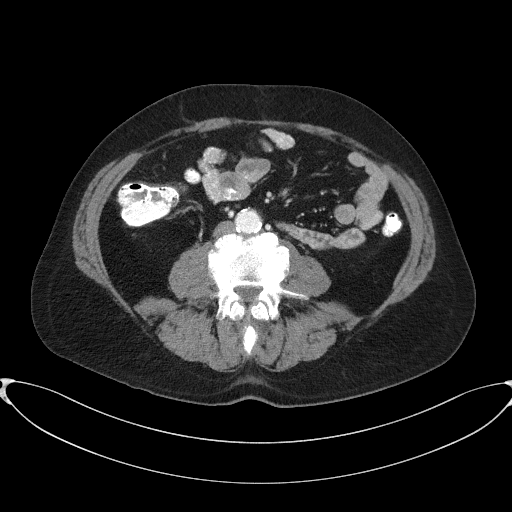
[im 67/98  soft-tissue]
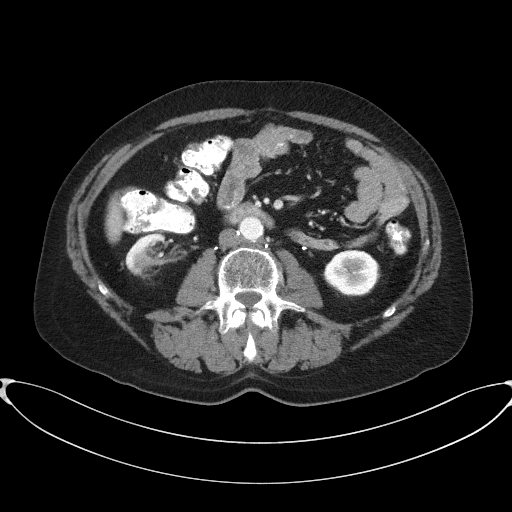
[im 67/98  bone]
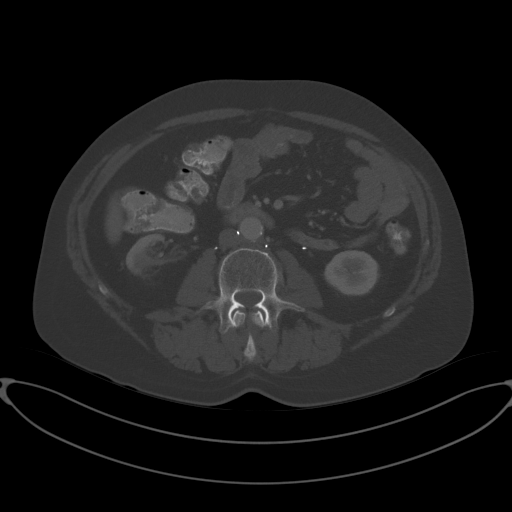
[im 73/98  soft-tissue]
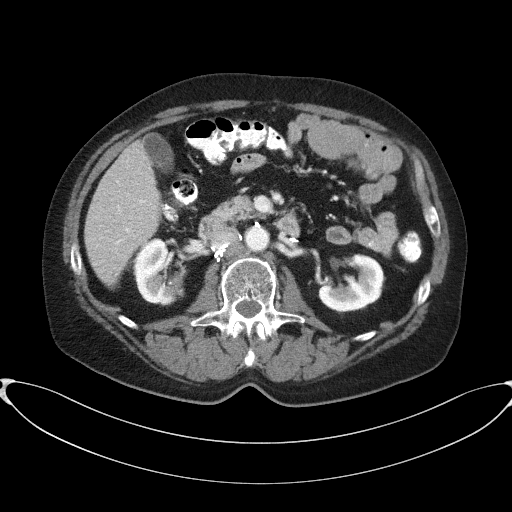
[im 85/98  soft-tissue]
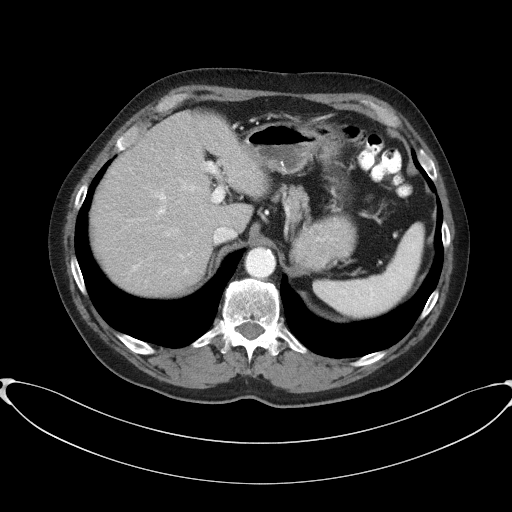
[im 91/98  soft-tissue]
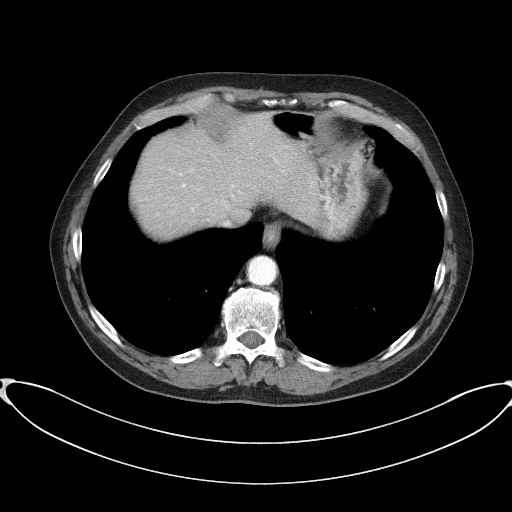

[Series 5: coronal st · coronal · 0.77mm/px · 3 of 101 slices shown]
[im 34/101  soft-tissue]
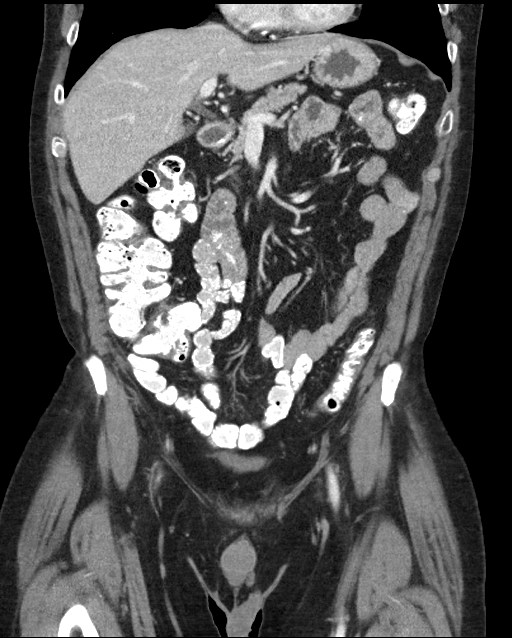
[im 45/101  soft-tissue]
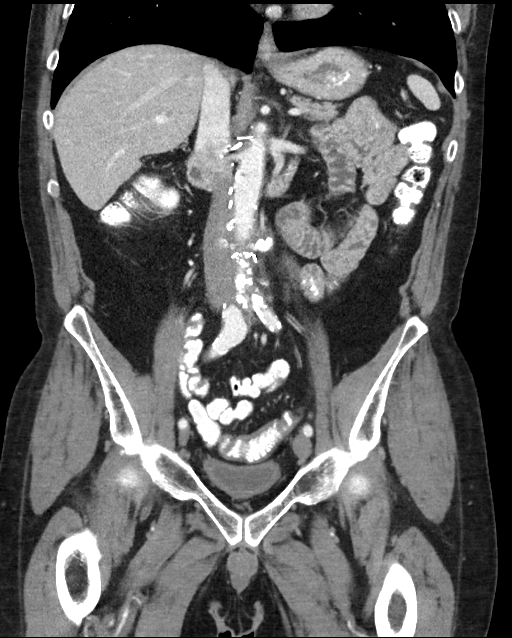
[im 56/101  soft-tissue]
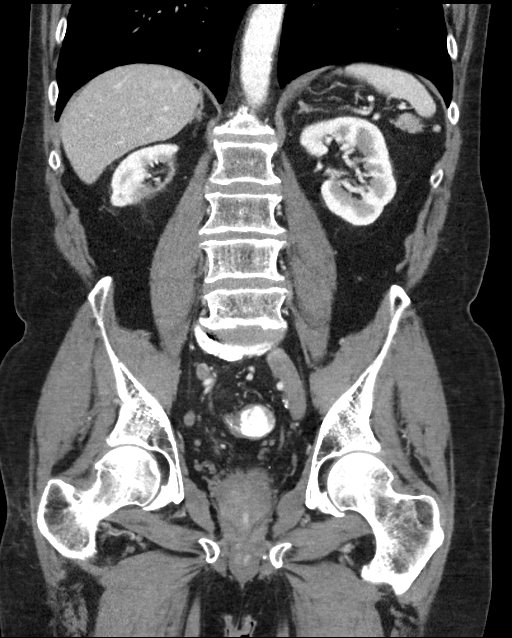

[15 of 46 positions shown; findings below may reference images not displayed]

FINDINGS: Lower chest: Limited visualization of the lower thorax is negative
for focal airspace opacity or pleural effusion.

Normal heart size.  No pericardial effusion.

Hepatobiliary: Normal hepatic contour. No discrete hepatic lesions.
Normal appearance of the gallbladder given degree distention. No
radiopaque gallstones. No intra extrahepatic bili duct dilatation.
No ascites.

Pancreas: Normal appearance of the pancreas

Spleen: Normal appearance of the spleen. Note is again made of a
punctate splenule.

Adrenals/Urinary Tract: There is symmetric enhancement and excretion
of the bilateral kidneys. Re- demonstrated geographic atrophy
involving in the posterior inferior aspects of the right kidney
presumably the sequela of prior infarct. No definite renal stones on
this postcontrast examination. No discrete renal lesions. No urine
obstruction or perinephric stranding.

Normal appearance of the bilateral adrenal glands.

Normal appearance of the urinary bladder given underdistention.

Stomach/Bowel: Ingested enteric contrast extends to the level the
rectum. No evidence of enteric obstruction. The bowel is normal in
course and caliber without wall thickening. Normal appearance of the
terminal ileum. The appendix is not visualized compatible provided
operative history. No pneumoperitoneum, pneumatosis or portal venous
gas.

Vascular/Lymphatic: Moderate to large amount of mixed calcified and
noncalcified atherosclerotic plaque within a normal caliber
abdominal aorta. Unchanged fusiform aneurysmal dilatation of the
right common iliac artery measuring approximately 2 cm in diameter
(image 49, series 2) similar to the [DATE] examination.

Post bilateral retroperitoneal lymph node dissection. Solitary
prominent approximately 0.6 cm) mesenteric lymph node within the
right mid/ lower abdomen (image 42, series 2) appear similar to the
[DATE] examination is to resume emboli reactive etiology. No bulky
retroperitoneal, mesenteric, pelvic or inguinal lymphadenopathy.

Reproductive: Sequela of prior right orchectomy. Normal appearance
of the prostate gland. No free fluid in the pelvic cul-de-sac.

Other: Regional soft tissues appear normal.

Musculoskeletal: No acute or aggressive osseous abnormalities.
Mild-to-moderate multilevel lumbar spine DDD, worse at L5-S1 with
disc space height loss, endplate irregularity and sclerosis.
Bilateral facet degenerative change within the lower lumbar spine.
IMPRESSION: 1. Stable examination without evidence of metastatic disease within
the abdomen or pelvis.
2.  Aortic Atherosclerosis (P184S-PXI.I).
3. Geographic atrophy involving the mid and inferior aspects of the
right kidney, presumably the sequela of prior vascular insult.
4. Stable approximately 1.9 cm right common iliac artery aneurysm,
similar to the [DATE] examination.

## 2018-09-14 ENCOUNTER — Other Ambulatory Visit: Payer: Medicare Other

## 2018-09-20 ENCOUNTER — Inpatient Hospital Stay: Payer: Medicare Other | Attending: Oncology

## 2018-09-20 ENCOUNTER — Other Ambulatory Visit: Payer: Self-pay

## 2018-09-20 DIAGNOSIS — C6290 Malignant neoplasm of unspecified testis, unspecified whether descended or undescended: Secondary | ICD-10-CM | POA: Insufficient documentation

## 2018-09-20 DIAGNOSIS — N189 Chronic kidney disease, unspecified: Secondary | ICD-10-CM | POA: Diagnosis not present

## 2018-09-20 DIAGNOSIS — Z9221 Personal history of antineoplastic chemotherapy: Secondary | ICD-10-CM | POA: Diagnosis not present

## 2018-09-20 DIAGNOSIS — Z9079 Acquired absence of other genital organ(s): Secondary | ICD-10-CM | POA: Diagnosis not present

## 2018-09-20 LAB — CBC WITH DIFFERENTIAL (CANCER CENTER ONLY)
Abs Immature Granulocytes: 0.02 10*3/uL (ref 0.00–0.07)
Basophils Absolute: 0.1 10*3/uL (ref 0.0–0.1)
Basophils Relative: 1 %
Eosinophils Absolute: 0.2 10*3/uL (ref 0.0–0.5)
Eosinophils Relative: 3 %
HCT: 42.1 % (ref 39.0–52.0)
Hemoglobin: 14.5 g/dL (ref 13.0–17.0)
Immature Granulocytes: 0 %
Lymphocytes Relative: 25 %
Lymphs Abs: 1.3 10*3/uL (ref 0.7–4.0)
MCH: 33.1 pg (ref 26.0–34.0)
MCHC: 34.4 g/dL (ref 30.0–36.0)
MCV: 96.1 fL (ref 80.0–100.0)
Monocytes Absolute: 0.5 10*3/uL (ref 0.1–1.0)
Monocytes Relative: 10 %
Neutro Abs: 3.1 10*3/uL (ref 1.7–7.7)
Neutrophils Relative %: 61 %
Platelet Count: 175 10*3/uL (ref 150–400)
RBC: 4.38 MIL/uL (ref 4.22–5.81)
RDW: 13.2 % (ref 11.5–15.5)
WBC Count: 5.1 10*3/uL (ref 4.0–10.5)
nRBC: 0 % (ref 0.0–0.2)

## 2018-09-20 LAB — CMP (CANCER CENTER ONLY)
ALT: 28 U/L (ref 0–44)
AST: 27 U/L (ref 15–41)
Albumin: 4 g/dL (ref 3.5–5.0)
Alkaline Phosphatase: 54 U/L (ref 38–126)
Anion gap: 9 (ref 5–15)
BUN: 27 mg/dL — ABNORMAL HIGH (ref 8–23)
CO2: 21 mmol/L — ABNORMAL LOW (ref 22–32)
Calcium: 9.4 mg/dL (ref 8.9–10.3)
Chloride: 106 mmol/L (ref 98–111)
Creatinine: 1.39 mg/dL — ABNORMAL HIGH (ref 0.61–1.24)
GFR, Est AFR Am: 60 mL/min — ABNORMAL LOW (ref >60)
GFR, Estimated: 52 mL/min — ABNORMAL LOW (ref >60)
Glucose, Bld: 86 mg/dL (ref 70–99)
Potassium: 4.3 mmol/L (ref 3.5–5.1)
Sodium: 136 mmol/L (ref 135–145)
Total Bilirubin: 0.7 mg/dL (ref 0.3–1.2)
Total Protein: 7 g/dL (ref 6.5–8.1)

## 2018-09-21 ENCOUNTER — Ambulatory Visit: Payer: Medicare Other | Admitting: Oncology

## 2018-09-27 ENCOUNTER — Inpatient Hospital Stay: Payer: Medicare Other | Admitting: Oncology

## 2018-09-27 ENCOUNTER — Other Ambulatory Visit: Payer: Self-pay

## 2018-09-27 VITALS — BP 139/80 | HR 58 | Temp 98.5°F | Resp 18 | Ht 70.0 in | Wt 191.6 lb

## 2018-09-27 DIAGNOSIS — C6290 Malignant neoplasm of unspecified testis, unspecified whether descended or undescended: Secondary | ICD-10-CM | POA: Diagnosis not present

## 2018-09-27 NOTE — Progress Notes (Signed)
[place] OFFICE PROGRESS NOTE 09/27/18  Dennis Dy, MD [address], [address] [place] [address]  PRINCIPLE DIAGNOSIS: 68 year old man with testicular cancer diagnosed in 2012.  He was found to have right spicular seminoma with beta hCG was 258.8, and the initial LDH was 530 indicating stage IIC  Prior therapy: Chemotherapy with cisplatin and VP-16 was started on January 17, 2011.  He completed 6 cycles of therapy in March 2013.  He status post right inguinal radical orchiectomy and retroperitoneal lymphadenectomy. Pathology indicate that there was no evidence of a viable tumor in any of the resected specimens this was completed at [facility] by Dr. Lorine [name] on 06/23/2011  CURRENT THERAPY: Active surveillance:  INTERVAL HISTORY: Dennis [name] is here for a repeat evaluation.  Since the last visit, he reports no major changes in his health.  He is under evaluation by Dr. Alyson [name] for his prostate although no evidence of cancer or cancer diagnosis has been made.  His overall health and performance status remains unchanged.  He denies any recent hospitalizations or illnesses.  He denies any urinary complaints.   Patient denied any alteration mental status, neuropathy, confusion or dizziness.  Denies any headaches or lethargy.  Denies any night sweats, weight loss or changes in appetite.  Denied orthopnea, dyspnea on exertion or chest discomfort.  Denies shortness of breath, difficulty breathing hemoptysis or cough.  Denies any abdominal distention, nausea, early satiety or dyspepsia.  Denies any hematuria, frequency, dysuria or nocturia.  Denies any skin irritation, dryness or rash.  Denies any ecchymosis or petechiae.  Denies any lymphadenopathy or clotting.  Denies any heat or cold intolerance.  Denies any anxiety or depression.  Remaining review of system is negative.          ALLERGIES:  has No Known Allergies.  MEDICATIONS: has a current medication list which  includes the following prescription(s): b complex vitamins and simvastatin.  PHYSICAL EXAMINATION: ECOG PERFORMANCE STATUS: 0 - Asymptomatic  Blood pressure 139/80, pulse (!) 58, temperature 98.5 F (36.9 C), temperature source Oral, resp. rate 18, height 5\' 10"  (1.778 m), weight 191 lb 9.6 oz (86.9 kg), SpO2 100 %.    General appearance: Comfortable appearing without any discomfort Head: Normocephalic without any trauma Oropharynx: Mucous membranes are moist and pink without any thrush or ulcers. Eyes: Pupils are equal and round reactive to light. Lymph nodes: No cervical, supraclavicular, inguinal or axillary lymphadenopathy.   Heart:regular rate and rhythm.  S1 and S2 without leg edema. Lung: Clear without any rhonchi or wheezes.  No dullness to percussion. Abdomin: Soft, nontender, nondistended with good bowel sounds.  No hepatosplenomegaly. Musculoskeletal: No joint deformity or effusion.  Full range of motion noted. Neurological: No deficits noted on motor, sensory and deep tendon reflex exam. Skin: No petechial rash or dryness.  Appeared moist.    Labs:  Lab Results  Component Value Date   WBC 5.1 09/20/2018   HGB 14.5 09/20/2018   HCT 42.1 09/20/2018   MCV 96.1 09/20/2018   PLT 175 09/20/2018   NEUTROABS 3.1 09/20/2018      Chemistry      Component Value Date/Time   NA 136 09/20/2018 1058   NA 137 09/07/2016 0739   K 4.3 09/20/2018 1058   K 4.3 09/07/2016 0739   CL 106 09/20/2018 1058   CL 105 12/13/2011 1137   CO2 21 (L) 09/20/2018 1058   CO2 26 09/07/2016 0739   BUN 27 (H) 09/20/2018 1058   BUN 28.0 (  H) 09/07/2016 0739   CREATININE 1.39 (H) 09/20/2018 1058   CREATININE 1.3 09/07/2016 0739      Component Value Date/Time   CALCIUM 9.4 09/20/2018 1058   CALCIUM 9.8 09/07/2016 0739   ALKPHOS 54 09/20/2018 1058   ALKPHOS 54 09/07/2016 0739   AST 27 09/20/2018 1058   AST 41 (H) 09/07/2016 0739   ALT 28 09/20/2018 1058   ALT 66 (H) 09/07/2016 0739    BILITOT 0.7 09/20/2018 1058   BILITOT 0.96 09/07/2016 0739       ASSESSMENT:    68 year old man:    1.  Right testicular seminoma diagnosed in 2012.  He was found to have stage IIC disease and remains in remission.  The natural course of this disease and risk of relapse was assessed.  At this time his risk of relapse remains low but it is possible after 8 years.  The plan is to continue with annual surveillance at this time.  Risks and benefits of continuing this versus discontinuation of surveillance was discussed.  Treatment options for relapsed seminoma was also reviewed.  2.  Renal insufficiency: His creatinine has decreased at this time and close to baseline.  3.  Urological surveillance: He is following up with Dr. Alyson [name] for any potential prostate malignancy.   4. Follow-up: In 12 months for repeat evaluation.  15  minutes was spent with the patient face-to-face today.  More than 50% of time was spent on reviewing his laboratory data, disease status update and future plan of care.     Dennis Button, MD 03/07/2014 9:02 AM

## 2018-09-28 ENCOUNTER — Telehealth: Payer: Self-pay | Admitting: Oncology

## 2018-09-28 NOTE — Telephone Encounter (Signed)
Called and left msg. Mailed printout  °

## 2018-12-24 ENCOUNTER — Other Ambulatory Visit: Payer: Self-pay | Admitting: Internal Medicine

## 2018-12-24 ENCOUNTER — Ambulatory Visit
Admission: RE | Admit: 2018-12-24 | Discharge: 2018-12-24 | Disposition: A | Discharge status: Home / Self Care | Payer: Medicare Other | Source: Ambulatory Visit | Attending: Internal Medicine | Admitting: Internal Medicine

## 2018-12-24 DIAGNOSIS — R059 Cough, unspecified: Secondary | ICD-10-CM

## 2018-12-24 DIAGNOSIS — R05 Cough: Secondary | ICD-10-CM

## 2019-01-08 ENCOUNTER — Other Ambulatory Visit: Payer: Self-pay | Admitting: Internal Medicine

## 2019-01-08 DIAGNOSIS — I723 Aneurysm of iliac artery: Secondary | ICD-10-CM

## 2019-01-16 ENCOUNTER — Other Ambulatory Visit: Payer: Medicare Other

## 2019-02-18 ENCOUNTER — Other Ambulatory Visit: Payer: Self-pay | Admitting: Urology

## 2019-02-18 DIAGNOSIS — R972 Elevated prostate specific antigen [PSA]: Secondary | ICD-10-CM

## 2019-03-04 ENCOUNTER — Ambulatory Visit
Admission: RE | Admit: 2019-03-04 | Discharge: 2019-03-04 | Disposition: A | Discharge status: Home / Self Care | Payer: Medicare Other | Source: Ambulatory Visit | Attending: Internal Medicine | Admitting: Internal Medicine

## 2019-03-04 DIAGNOSIS — I723 Aneurysm of iliac artery: Secondary | ICD-10-CM

## 2019-03-04 MED ORDER — IOPAMIDOL (ISOVUE-370) INJECTION 76%
75.0000 mL | Freq: Once | INTRAVENOUS | Status: AC | PRN
  Administered 2019-03-04: 12:00:00 75 mL via INTRAVENOUS

## 2019-03-14 ENCOUNTER — Other Ambulatory Visit: Payer: Self-pay

## 2019-03-14 ENCOUNTER — Ambulatory Visit
Admission: RE | Admit: 2019-03-14 | Discharge: 2019-03-14 | Disposition: A | Discharge status: Home / Self Care | Payer: Medicare Other | Source: Ambulatory Visit | Attending: Urology | Admitting: Urology

## 2019-03-14 DIAGNOSIS — R972 Elevated prostate specific antigen [PSA]: Secondary | ICD-10-CM

## 2019-03-14 MED ORDER — GADOBENATE DIMEGLUMINE 529 MG/ML IV SOLN
17.0000 mL | Freq: Once | INTRAVENOUS | Status: AC | PRN
  Administered 2019-03-14: 17 mL via INTRAVENOUS

## 2019-09-27 ENCOUNTER — Inpatient Hospital Stay: Payer: Medicare Other | Attending: Oncology | Admitting: Oncology

## 2019-09-27 ENCOUNTER — Inpatient Hospital Stay: Payer: Medicare Other

## 2019-09-27 ENCOUNTER — Other Ambulatory Visit: Payer: Self-pay

## 2019-09-27 VITALS — BP 108/75 | HR 62 | Temp 96.9°F | Resp 18 | Ht 70.0 in | Wt 194.7 lb

## 2019-09-27 DIAGNOSIS — C6290 Malignant neoplasm of unspecified testis, unspecified whether descended or undescended: Secondary | ICD-10-CM | POA: Diagnosis not present

## 2019-09-27 DIAGNOSIS — Z9221 Personal history of antineoplastic chemotherapy: Secondary | ICD-10-CM | POA: Insufficient documentation

## 2019-09-27 DIAGNOSIS — N289 Disorder of kidney and ureter, unspecified: Secondary | ICD-10-CM | POA: Diagnosis not present

## 2019-09-27 DIAGNOSIS — Z8547 Personal history of malignant neoplasm of testis: Secondary | ICD-10-CM | POA: Diagnosis present

## 2019-09-27 DIAGNOSIS — Z9079 Acquired absence of other genital organ(s): Secondary | ICD-10-CM | POA: Insufficient documentation

## 2019-09-27 LAB — CBC WITH DIFFERENTIAL (CANCER CENTER ONLY)
Abs Immature Granulocytes: 0.03 10*3/uL (ref 0.00–0.07)
Basophils Absolute: 0.1 10*3/uL (ref 0.0–0.1)
Basophils Relative: 2 %
Eosinophils Absolute: 0.4 10*3/uL (ref 0.0–0.5)
Eosinophils Relative: 7 %
HCT: 41.4 % (ref 39.0–52.0)
Hemoglobin: 14.5 g/dL (ref 13.0–17.0)
Immature Granulocytes: 1 %
Lymphocytes Relative: 27 %
Lymphs Abs: 1.6 10*3/uL (ref 0.7–4.0)
MCH: 33.6 pg (ref 26.0–34.0)
MCHC: 35 g/dL (ref 30.0–36.0)
MCV: 95.8 fL (ref 80.0–100.0)
Monocytes Absolute: 0.6 10*3/uL (ref 0.1–1.0)
Monocytes Relative: 10 %
Neutro Abs: 3.2 10*3/uL (ref 1.7–7.7)
Neutrophils Relative %: 53 %
Platelet Count: 199 10*3/uL (ref 150–400)
RBC: 4.32 MIL/uL (ref 4.22–5.81)
RDW: 13.2 % (ref 11.5–15.5)
WBC Count: 6 10*3/uL (ref 4.0–10.5)
nRBC: 0 % (ref 0.0–0.2)

## 2019-09-27 LAB — CMP (CANCER CENTER ONLY)
ALT: 19 U/L (ref 0–44)
AST: 23 U/L (ref 15–41)
Albumin: 4.1 g/dL (ref 3.5–5.0)
Alkaline Phosphatase: 62 U/L (ref 38–126)
Anion gap: 9 (ref 5–15)
BUN: 35 mg/dL — ABNORMAL HIGH (ref 8–23)
CO2: 23 mmol/L (ref 22–32)
Calcium: 10.2 mg/dL (ref 8.9–10.3)
Chloride: 105 mmol/L (ref 98–111)
Creatinine: 1.79 mg/dL — ABNORMAL HIGH (ref 0.61–1.24)
GFR, Est AFR Am: 44 mL/min — ABNORMAL LOW (ref >60)
GFR, Estimated: 38 mL/min — ABNORMAL LOW (ref >60)
Glucose, Bld: 90 mg/dL (ref 70–99)
Potassium: 4.6 mmol/L (ref 3.5–5.1)
Sodium: 137 mmol/L (ref 135–145)
Total Bilirubin: 0.5 mg/dL (ref 0.3–1.2)
Total Protein: 7.3 g/dL (ref 6.5–8.1)

## 2019-09-27 LAB — LACTATE DEHYDROGENASE: LDH: 131 U/L (ref 98–192)

## 2019-09-27 NOTE — Progress Notes (Signed)
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  He was found to have elevated tumor marker with beta hCG was 258.8, and the initial LDH was 530.  Prior therapy: Chemotherapy with cisplatin and VP-16 was started on January 17, 2011.  He completed 6 cycles of therapy in March 2013.  He status post right inguinal radical orchiectomy and retroperitoneal lymphadenectomy. Pathology indicate that there was no evidence of a viable tumor in any of the resected specimens this was completed at [facility] by Dr. Lorine Bears on 06/23/2011  CURRENT THERAPY: Active surveillance:  INTERVAL HISTORY: Mr. Heffern is here for a return evaluation.  Since the last visit, he reports no major changes in his health.  He denies any recent hospitalization or illnesses.  He denies any urinary frequency, urgency or hematuria.  He denies any flank pain or abdominal pain.  His performance status quality of life remain excellent.          ALLERGIES:  has No Known Allergies.  MEDICATIONS: has a current medication list which includes the following prescription(s): b complex vitamins and simvastatin.  PHYSICAL EXAMINATION: ECOG PERFORMANCE STATUS: 0 - Asymptomatic   General appearance: Alert, awake without any distress. Head: Atraumatic without abnormalities Oropharynx: Without any thrush or ulcers. Eyes: No scleral icterus. Lymph nodes: No lymphadenopathy noted in the cervical, supraclavicular, or axillary nodes Heart:regular rate and rhythm, without any murmurs or gallops.   Lung: Clear to auscultation without any rhonchi, wheezes or dullness to percussion. Abdomin: Soft, nontender without any shifting dullness or ascites. Musculoskeletal: No clubbing or cyanosis. Neurological: No motor or sensory deficits. Skin: No rashes or  lesions.    Labs:  Lab Results  Component Value Date   WBC 5.1 09/20/2018   HGB 14.5 09/20/2018   HCT 42.1 09/20/2018   MCV 96.1 09/20/2018   PLT 175 09/20/2018   NEUTROABS 3.1 09/20/2018      Chemistry      Component Value Date/Time   NA 136 09/20/2018 1058   NA 137 09/07/2016 0739   K 4.3 09/20/2018 1058   K 4.3 09/07/2016 0739   CL 106 09/20/2018 1058   CL 105 12/13/2011 1137   CO2 21 (L) 09/20/2018 1058   CO2 26 09/07/2016 0739   BUN 27 (H) 09/20/2018 1058   BUN 28.0 (H) 09/07/2016 0739   CREATININE 1.39 (H) 09/20/2018 1058   CREATININE 1.3 09/07/2016 0739      Component Value Date/Time   CALCIUM 9.4 09/20/2018 1058   CALCIUM 9.8 09/07/2016 0739   ALKPHOS 54 09/20/2018 1058   ALKPHOS 54 09/07/2016 0739   AST 27 09/20/2018 1058   AST 41 (H) 09/07/2016 0739   ALT 28 09/20/2018 1058   ALT 66 (H) 09/07/2016 0739   BILITOT 0.7 09/20/2018 1058   BILITOT 0.96 09/07/2016 0739     FINDINGS: Prostate: Signs of BPH. Also with presumed prostatitis. Atrophy and low signal throughout the entire right peripheral zone on T2 weighted images with volume loss, no corresponding area of restricted diffusion to suggest high-risk lesion in this location. No area of restricted diffusion on high B value images within the prostate gland.  Volume: 4.2 x 2.9 x 4.2  cm(volume = 27 cc)  Transcapsular spread:  Absent  Seminal vesicle involvement: Absent  Neurovascular bundle involvement: Absent  Pelvic adenopathy: Absent  Bone metastasis: Absent  Other findings: Signs of dilated right common iliac artery partially visualized.  IMPRESSION: 1. No signs of high-risk lesion in the prostate, changes of BPH and prostatitis.  ASSESSMENT:    69 year old man:    1.  Stage IIc right testicular seminoma diagnosed in 2012.  He status post chemotherapy and surgical resection outlined above and continues to be disease-free since 2013.  The natural course of this disease  was reviewed and risk of relapse was assessed again.  Risks and benefits of continued active surveillance annually were reviewed at this time.  MRI of the prostate obtained on February 4 did not show any evidence of pelvic malignancy.  At this time I have recommended continued annual surveillance to complete 10 years and then suspend oncology follow-up.    2.  Renal insufficiency: Kidney function remains close to baseline at this time.  3.  Urological surveillance: He underwent MRI of the prostate and 2 prostate biopsies without any evidence of malignancy.  He is not having any urological symptoms.  4.  Covid vaccinations considerations: He is up-to-date on his vaccination series.   5. Follow-up: In 1 year for a follow-up visit.   30  minutes were dedicated to this encounter.  The time was spent on updating his disease status, discussing risk of relapse and outlining future plan of care.    Zola Button, MD 03/07/2014 1:07 PM

## 2019-09-28 LAB — AFP TUMOR MARKER: AFP, Serum, Tumor Marker: 2.9 ng/mL (ref 0.0–8.3)

## 2019-09-28 LAB — BETA HCG QUANT (REF LAB): hCG Quant: <1 m[IU]/mL (ref 0–3)

## 2019-09-30 ENCOUNTER — Telehealth: Payer: Self-pay | Admitting: Oncology

## 2019-09-30 NOTE — Telephone Encounter (Signed)
Scheduled appointments per 8/20 los. Attempted to call patient, but there was no answer and was unable to leave a message. Will mail patient updated calendar.

## 2020-09-25 ENCOUNTER — Inpatient Hospital Stay: Payer: Medicare Other | Attending: Oncology

## 2020-09-25 ENCOUNTER — Inpatient Hospital Stay: Payer: Medicare Other | Admitting: Oncology

## 2020-09-25 ENCOUNTER — Other Ambulatory Visit: Payer: Self-pay

## 2020-09-25 VITALS — BP 126/76 | HR 64 | Temp 98.2°F | Resp 19 | Ht 70.0 in | Wt 187.2 lb

## 2020-09-25 DIAGNOSIS — C6290 Malignant neoplasm of unspecified testis, unspecified whether descended or undescended: Secondary | ICD-10-CM

## 2020-09-25 DIAGNOSIS — Z8547 Personal history of malignant neoplasm of testis: Secondary | ICD-10-CM | POA: Insufficient documentation

## 2020-09-25 DIAGNOSIS — Z9221 Personal history of antineoplastic chemotherapy: Secondary | ICD-10-CM | POA: Diagnosis not present

## 2020-09-25 DIAGNOSIS — Z9079 Acquired absence of other genital organ(s): Secondary | ICD-10-CM | POA: Insufficient documentation

## 2020-09-25 LAB — CBC WITH DIFFERENTIAL (CANCER CENTER ONLY)
Abs Immature Granulocytes: 0.01 10*3/uL (ref 0.00–0.07)
Basophils Absolute: 0.1 10*3/uL (ref 0.0–0.1)
Basophils Relative: 1 %
Eosinophils Absolute: 0.4 10*3/uL (ref 0.0–0.5)
Eosinophils Relative: 6 %
HCT: 40.3 % (ref 39.0–52.0)
Hemoglobin: 13.7 g/dL (ref 13.0–17.0)
Immature Granulocytes: 0 %
Lymphocytes Relative: 25 %
Lymphs Abs: 1.4 10*3/uL (ref 0.7–4.0)
MCH: 31.7 pg (ref 26.0–34.0)
MCHC: 34 g/dL (ref 30.0–36.0)
MCV: 93.3 fL (ref 80.0–100.0)
Monocytes Absolute: 0.4 10*3/uL (ref 0.1–1.0)
Monocytes Relative: 8 %
Neutro Abs: 3.3 10*3/uL (ref 1.7–7.7)
Neutrophils Relative %: 60 %
Platelet Count: 201 10*3/uL (ref 150–400)
RBC: 4.32 MIL/uL (ref 4.22–5.81)
RDW: 13.2 % (ref 11.5–15.5)
WBC Count: 5.5 10*3/uL (ref 4.0–10.5)
nRBC: 0 % (ref 0.0–0.2)

## 2020-09-25 LAB — CMP (CANCER CENTER ONLY)
ALT: 16 U/L (ref 0–44)
AST: 17 U/L (ref 15–41)
Albumin: 3.9 g/dL (ref 3.5–5.0)
Alkaline Phosphatase: 73 U/L (ref 38–126)
Anion gap: 9 (ref 5–15)
BUN: 24 mg/dL — ABNORMAL HIGH (ref 8–23)
CO2: 23 mmol/L (ref 22–32)
Calcium: 9.3 mg/dL (ref 8.9–10.3)
Chloride: 109 mmol/L (ref 98–111)
Creatinine: 1.24 mg/dL (ref 0.61–1.24)
GFR, Estimated: >60 mL/min (ref >60)
Glucose, Bld: 123 mg/dL — ABNORMAL HIGH (ref 70–99)
Potassium: 3.9 mmol/L (ref 3.5–5.1)
Sodium: 141 mmol/L (ref 135–145)
Total Bilirubin: 0.4 mg/dL (ref 0.3–1.2)
Total Protein: 6.7 g/dL (ref 6.5–8.1)

## 2020-09-25 LAB — LACTATE DEHYDROGENASE: LDH: 114 U/L (ref 98–192)

## 2020-09-25 NOTE — Progress Notes (Signed)
[place] OFFICE PROGRESS NOTE 09/25/20  Dennis Dy, MD [address] [place]  [id number]  PRINCIPLE DIAGNOSIS: 70 year old man with testicular cancer diagnosed in 2012.  He was found to have stage IIc seminoma of the right testicle presented with beta hCG was 258.8, and the initial LDH was 530.  Prior therapy: Chemotherapy with cisplatin and VP-16 was started on January 17, 2011.  He completed 6 cycles of therapy in March 2013.  He status post right inguinal radical orchiectomy and retroperitoneal lymphadenectomy. Pathology indicate that there was no evidence of a viable tumor in any of the resected specimens this was completed at [facility] by Dr. Lorine [name] on 06/23/2011  CURRENT THERAPY: Active surveillance:  INTERVAL HISTORY: [name] is here for a return evaluation.  Since the last visit, 2 weeks later as we got had to be given mango as well as remainder there is very and he her          ALLERGIES:  has No Known Allergies.  MEDICATIONS: has a current medication list which includes the following prescription(s): b complex vitamins and simvastatin.  PHYSICAL EXAMINATION: ECOG PERFORMANCE STATUS: 0 - Asymptomatic   General appearance: Alert, awake without any distress. Head: Atraumatic without abnormalities Oropharynx: Without any thrush or ulcers. Eyes: No scleral icterus. Lymph nodes: No lymphadenopathy noted in the cervical, supraclavicular, or axillary nodes Heart:regular rate and rhythm, without any murmurs or gallops.   Lung: Clear to auscultation without any rhonchi, wheezes or dullness to percussion. Abdomin: Soft, nontender without any shifting dullness or ascites. Musculoskeletal: No clubbing or cyanosis. Neurological: No motor or sensory deficits. Skin: No rashes or lesions.    Labs:  Lab Results  Component Value Date   WBC 6.0 09/27/2019   HGB 14.5 09/27/2019   HCT 41.4 09/27/2019   MCV 95.8 09/27/2019   PLT 199  09/27/2019   NEUTROABS 3.2 09/27/2019      Chemistry      Component Value Date/Time   NA 137 09/27/2019 1300   NA 137 09/07/2016 0739   K 4.6 09/27/2019 1300   K 4.3 09/07/2016 0739   CL 105 09/27/2019 1300   CL 105 12/13/2011 1137   CO2 23 09/27/2019 1300   CO2 26 09/07/2016 0739   BUN 35 (H) 09/27/2019 1300   BUN 28.0 (H) 09/07/2016 0739   CREATININE 1.79 (H) 09/27/2019 1300   CREATININE 1.3 09/07/2016 0739      Component Value Date/Time   CALCIUM 10.2 09/27/2019 1300   CALCIUM 9.8 09/07/2016 0739   ALKPHOS 62 09/27/2019 1300   ALKPHOS 54 09/07/2016 0739   AST 23 09/27/2019 1300   AST 41 (H) 09/07/2016 0739   ALT 19 09/27/2019 1300   ALT 66 (H) 09/07/2016 0739   BILITOT 0.5 09/27/2019 1300   BILITOT 0.96 09/07/2016 0739       ASSESSMENT:    70 year old man:    1.  Testicular cancer diagnosed in 2012.  He was found to have stage IIC right  seminoma diagnosed in 2012.   His disease status was updated at this time and treatment choices were reviewed.  He is over 10 years out from his seminoma without any evidence of relapse.  Risks and benefits of continuing annual visits were reviewed.  At this time he opted against continuing this and will continue to follow with his primary care physician instead.  If he develops any signs and symptoms of malignancy, we will reevaluate at that time.  2.  Renal insufficiency: No further decline in his kidney function noted.  Laboratory data from today showed a normal CBC as well.     3. Follow-up: I am happy to see him in the future as needed.   20  minutes were spent on this visit.  The time was dedicated to reviewing laboratory data, disease status update and future plan of care review.   Zola Button, MD 03/07/2014 1:16 PM

## 2020-09-26 LAB — BETA HCG QUANT (REF LAB): hCG Quant: <1 m[IU]/mL (ref 0–3)

## 2020-09-26 LAB — AFP TUMOR MARKER: AFP, Serum, Tumor Marker: 2 ng/mL (ref 0.0–8.4)

## 2023-06-07 ENCOUNTER — Inpatient Hospital Stay (HOSPITAL_COMMUNITY)

## 2023-06-07 ENCOUNTER — Inpatient Hospital Stay (HOSPITAL_COMMUNITY)
Admission: EM | Admit: 2023-06-07 | Discharge: 2023-06-09 | DRG: 192 | Disposition: A | Discharge status: Home / Self Care | Attending: Internal Medicine | Admitting: Internal Medicine

## 2023-06-07 ENCOUNTER — Emergency Department (HOSPITAL_COMMUNITY)

## 2023-06-07 ENCOUNTER — Other Ambulatory Visit: Payer: Self-pay

## 2023-06-07 ENCOUNTER — Encounter (HOSPITAL_COMMUNITY): Payer: Self-pay

## 2023-06-07 DIAGNOSIS — I129 Hypertensive chronic kidney disease with stage 1 through stage 4 chronic kidney disease, or unspecified chronic kidney disease: Secondary | ICD-10-CM | POA: Diagnosis present

## 2023-06-07 DIAGNOSIS — Z1152 Encounter for screening for COVID-19: Secondary | ICD-10-CM | POA: Diagnosis not present

## 2023-06-07 DIAGNOSIS — Z79899 Other long term (current) drug therapy: Secondary | ICD-10-CM

## 2023-06-07 DIAGNOSIS — F17210 Nicotine dependence, cigarettes, uncomplicated: Secondary | ICD-10-CM | POA: Diagnosis present

## 2023-06-07 DIAGNOSIS — E785 Hyperlipidemia, unspecified: Secondary | ICD-10-CM | POA: Diagnosis present

## 2023-06-07 DIAGNOSIS — R0602 Shortness of breath: Secondary | ICD-10-CM | POA: Diagnosis present

## 2023-06-07 DIAGNOSIS — Z8547 Personal history of malignant neoplasm of testis: Secondary | ICD-10-CM

## 2023-06-07 DIAGNOSIS — R0902 Hypoxemia: Secondary | ICD-10-CM | POA: Diagnosis present

## 2023-06-07 DIAGNOSIS — J441 Chronic obstructive pulmonary disease with (acute) exacerbation: Secondary | ICD-10-CM | POA: Diagnosis present

## 2023-06-07 DIAGNOSIS — N1831 Chronic kidney disease, stage 3a: Secondary | ICD-10-CM | POA: Diagnosis present

## 2023-06-07 DIAGNOSIS — I10 Essential (primary) hypertension: Secondary | ICD-10-CM

## 2023-06-07 DIAGNOSIS — F17200 Nicotine dependence, unspecified, uncomplicated: Secondary | ICD-10-CM

## 2023-06-07 DIAGNOSIS — N189 Chronic kidney disease, unspecified: Secondary | ICD-10-CM

## 2023-06-07 DIAGNOSIS — J9601 Acute respiratory failure with hypoxia: Principal | ICD-10-CM

## 2023-06-07 DIAGNOSIS — R062 Wheezing: Secondary | ICD-10-CM

## 2023-06-07 HISTORY — DX: Essential (primary) hypertension: I10

## 2023-06-07 LAB — COMPREHENSIVE METABOLIC PANEL WITH GFR
ALT: 16 U/L (ref 0–44)
AST: 23 U/L (ref 15–41)
Albumin: 4.3 g/dL (ref 3.5–5.0)
Alkaline Phosphatase: 60 U/L (ref 38–126)
Anion gap: 12 (ref 5–15)
BUN: 25 mg/dL — ABNORMAL HIGH (ref 8–23)
CO2: 24 mmol/L (ref 22–32)
Calcium: 9.8 mg/dL (ref 8.9–10.3)
Chloride: 103 mmol/L (ref 98–111)
Creatinine, Ser: 1.35 mg/dL — ABNORMAL HIGH (ref 0.61–1.24)
GFR, Estimated: 56 mL/min — ABNORMAL LOW (ref >60)
Glucose, Bld: 109 mg/dL — ABNORMAL HIGH (ref 70–99)
Potassium: 3.6 mmol/L (ref 3.5–5.1)
Sodium: 139 mmol/L (ref 135–145)
Total Bilirubin: 0.8 mg/dL (ref 0.0–1.2)
Total Protein: 7.8 g/dL (ref 6.5–8.1)

## 2023-06-07 LAB — CBC
HCT: 46 % (ref 39.0–52.0)
Hemoglobin: 15.8 g/dL (ref 13.0–17.0)
MCH: 32.7 pg (ref 26.0–34.0)
MCHC: 34.3 g/dL (ref 30.0–36.0)
MCV: 95.2 fL (ref 80.0–100.0)
Platelets: 206 10*3/uL (ref 150–400)
RBC: 4.83 MIL/uL (ref 4.22–5.81)
RDW: 12.6 % (ref 11.5–15.5)
WBC: 8.1 10*3/uL (ref 4.0–10.5)
nRBC: 0 % (ref 0.0–0.2)

## 2023-06-07 LAB — RESP PANEL BY RT-PCR (RSV, FLU A&B, COVID)  RVPGX2
Influenza A by PCR: NEGATIVE
Influenza B by PCR: NEGATIVE
Resp Syncytial Virus by PCR: NEGATIVE
SARS Coronavirus 2 by RT PCR: NEGATIVE

## 2023-06-07 LAB — BRAIN NATRIURETIC PEPTIDE: B Natriuretic Peptide: 156.6 pg/mL — ABNORMAL HIGH (ref 0.0–100.0)

## 2023-06-07 LAB — D-DIMER, QUANTITATIVE: D-Dimer, Quant: 0.52 ug{FEU}/mL — ABNORMAL HIGH (ref 0.00–0.50)

## 2023-06-07 MED ORDER — IOHEXOL 350 MG/ML SOLN
75.0000 mL | Freq: Once | INTRAVENOUS | Status: AC | PRN
  Administered 2023-06-07: 75 mL via INTRAVENOUS

## 2023-06-07 MED ORDER — ACETAMINOPHEN 325 MG PO TABS: 650.0000 mg | ORAL_TABLET | Freq: Four times a day (QID) | ORAL | Status: DC | PRN

## 2023-06-07 MED ORDER — IPRATROPIUM BROMIDE 0.02 % IN SOLN
0.5000 mg | Freq: Once | RESPIRATORY_TRACT | Status: AC
  Administered 2023-06-07: 0.5 mg via RESPIRATORY_TRACT
  Filled 2023-06-07: qty 2.5

## 2023-06-07 MED ORDER — METHYLPREDNISOLONE SODIUM SUCC 125 MG IJ SOLR
125.0000 mg | Freq: Once | INTRAMUSCULAR | Status: AC
  Administered 2023-06-07: 125 mg via INTRAVENOUS
  Filled 2023-06-07: qty 2

## 2023-06-07 MED ORDER — ALBUTEROL SULFATE (2.5 MG/3ML) 0.083% IN NEBU
5.0000 mg | INHALATION_SOLUTION | Freq: Once | RESPIRATORY_TRACT | Status: AC
  Administered 2023-06-07: 5 mg via RESPIRATORY_TRACT
  Filled 2023-06-07: qty 6

## 2023-06-07 MED ORDER — FLUTICASONE FUROATE-VILANTEROL 200-25 MCG/ACT IN AEPB
1.0000 | INHALATION_SPRAY | Freq: Every day | RESPIRATORY_TRACT | Status: DC
  Administered 2023-06-09: 1 via RESPIRATORY_TRACT
  Filled 2023-06-07: qty 28

## 2023-06-07 MED ORDER — IPRATROPIUM-ALBUTEROL 0.5-2.5 (3) MG/3ML IN SOLN
3.0000 mL | Freq: Three times a day (TID) | RESPIRATORY_TRACT | Status: DC
  Administered 2023-06-08 – 2023-06-09 (×4): 3 mL via RESPIRATORY_TRACT
  Filled 2023-06-07 (×4): qty 3

## 2023-06-07 MED ORDER — IPRATROPIUM-ALBUTEROL 0.5-2.5 (3) MG/3ML IN SOLN
3.0000 mL | RESPIRATORY_TRACT | Status: DC
  Administered 2023-06-07: 3 mL via RESPIRATORY_TRACT
  Filled 2023-06-07: qty 3

## 2023-06-07 MED ORDER — NICOTINE POLACRILEX 2 MG MT GUM: 2.0000 mg | CHEWING_GUM | OROMUCOSAL | Status: DC | PRN

## 2023-06-07 MED ORDER — METHYLPREDNISOLONE SODIUM SUCC 125 MG IJ SOLR
60.0000 mg | INTRAMUSCULAR | Status: DC
  Administered 2023-06-08 – 2023-06-09 (×2): 60 mg via INTRAVENOUS
  Filled 2023-06-07 (×2): qty 2

## 2023-06-07 MED ORDER — SODIUM CHLORIDE 0.9% FLUSH
3.0000 mL | Freq: Two times a day (BID) | INTRAVENOUS | Status: DC
  Administered 2023-06-07 – 2023-06-09 (×4): 3 mL via INTRAVENOUS

## 2023-06-07 MED ORDER — ACETAMINOPHEN 650 MG RE SUPP: 650.0000 mg | Freq: Four times a day (QID) | RECTAL | Status: DC | PRN

## 2023-06-07 MED ORDER — ROSUVASTATIN CALCIUM 20 MG PO TABS
20.0000 mg | ORAL_TABLET | Freq: Every day | ORAL | Status: DC
  Administered 2023-06-08 – 2023-06-09 (×2): 20 mg via ORAL
  Filled 2023-06-07 (×2): qty 1

## 2023-06-07 MED ORDER — ENOXAPARIN SODIUM 40 MG/0.4ML IJ SOSY
40.0000 mg | PREFILLED_SYRINGE | INTRAMUSCULAR | Status: DC
  Administered 2023-06-08 – 2023-06-09 (×2): 40 mg via SUBCUTANEOUS
  Filled 2023-06-07 (×2): qty 0.4

## 2023-06-07 MED ORDER — LACTATED RINGERS IV SOLN: INTRAVENOUS | Status: AC

## 2023-06-07 MED ORDER — POLYETHYLENE GLYCOL 3350 17 G PO PACK: 17.0000 g | PACK | Freq: Every day | ORAL | Status: DC | PRN

## 2023-06-07 MED ORDER — GUAIFENESIN-DM 100-10 MG/5ML PO SYRP
5.0000 mL | ORAL_SOLUTION | ORAL | Status: DC | PRN
  Administered 2023-06-07: 5 mL via ORAL
  Filled 2023-06-07: qty 5

## 2023-06-07 MED ORDER — PANTOPRAZOLE SODIUM 20 MG PO TBEC
20.0000 mg | DELAYED_RELEASE_TABLET | Freq: Every day | ORAL | Status: DC
  Administered 2023-06-07 – 2023-06-09 (×3): 20 mg via ORAL
  Filled 2023-06-07 (×3): qty 1

## 2023-06-07 MED ORDER — AMLODIPINE BESYLATE 5 MG PO TABS
5.0000 mg | ORAL_TABLET | Freq: Every day | ORAL | Status: DC
  Administered 2023-06-08 – 2023-06-09 (×2): 5 mg via ORAL
  Filled 2023-06-07 (×2): qty 1

## 2023-06-07 MED ORDER — VITAMIN D 25 MCG (1000 UNIT) PO TABS
1000.0000 [IU] | ORAL_TABLET | Freq: Every day | ORAL | Status: DC
  Administered 2023-06-08 – 2023-06-09 (×2): 1000 [IU] via ORAL
  Filled 2023-06-07 (×2): qty 1

## 2023-06-07 NOTE — ED Provider Notes (Addendum)
 [place] EMERGENCY DEPARTMENT AT [facility] Provider Note   CSN: [id number] Arrival date & time: 06/07/23  1421     History  Chief Complaint  Patient presents with   Shortness of Breath    Dennis [name] is a 73 y.o. male.  Pt with c/o non prod cough and increased sob in past week. States thought he may have covid so took covid test, negative, and went to pcp today. At pcp office pulse ox was low, and told possible pna or fluid in lungs and to go to ER. +smoker. Denies hx asthma or copd.  Denies leg pain or swelling. No hx dvt or pe. Denies chest pain or discomfort. No sore throat or trouble swallowing.   The history is provided by the patient and medical records.  Shortness of Breath Associated symptoms: cough   Associated symptoms: no abdominal pain, no chest pain, no fever, no headaches, no neck pain, no sore throat and no vomiting        Home Medications Prior to Admission medications   Medication Sig Start Date End Date Taking? Authorizing Provider  amLODipine (NORVASC) 5 MG tablet Take 5 mg by mouth daily. 05/14/23   [provider]  b complex vitamins capsule Take 1 capsule by mouth daily.    [provider]  rosuvastatin (CRESTOR) 20 MG tablet Take 20 mg by mouth daily. 05/14/23   [provider]  simvastatin (ZOCOR) 20 MG tablet Take 20 mg by mouth every evening.    [provider]      Allergies    Patient has no known allergies.    Review of Systems   Review of Systems  Constitutional:  Negative for chills and fever.  HENT:  Negative for sore throat.   Respiratory:  Positive for cough and shortness of breath.   Cardiovascular:  Negative for chest pain, palpitations and leg swelling.  Gastrointestinal:  Negative for abdominal pain, diarrhea and vomiting.  Genitourinary:  Negative for flank pain.  Musculoskeletal:  Negative for back pain and neck pain.  Neurological:  Negative for headaches.    Physical  Exam Updated Vital Signs BP 135/87   Pulse 73   Temp 98.9 F (37.2 C) (Oral)   Resp (!) 23   SpO2 94%  Physical Exam Vitals and nursing note reviewed.  Constitutional:      Appearance: Normal appearance. He is well-developed.  HENT:     Head: Atraumatic.     Nose: Nose normal.     Mouth/Throat:     Mouth: Mucous membranes are moist.     Pharynx: Oropharynx is clear.  Eyes:     General: No scleral icterus.    Pupils: Pupils are equal, round, and reactive to light.  Neck:     Trachea: No tracheal deviation.  Cardiovascular:     Rate and Rhythm: Normal rate and regular rhythm.     Pulses: Normal pulses.     Heart sounds: Normal heart sounds. No murmur heard.    No friction rub. No gallop.  Pulmonary:     Effort: Pulmonary effort is normal. No accessory muscle usage or respiratory distress.     Breath sounds: Wheezing present.     Comments: Wheezing bil.  Abdominal:     General: Bowel sounds are normal. There is no distension.     Palpations: Abdomen is soft.     Tenderness: There is no abdominal tenderness.  Musculoskeletal:        General: No  swelling or tenderness.     Cervical back: Normal range of motion and neck supple. No rigidity.     Right lower leg: No edema.     Left lower leg: No edema.  Skin:    General: Skin is warm and dry.     Findings: No rash.  Neurological:     Mental Status: He is alert.     Comments: Alert, speech clear.   Psychiatric:        Mood and Affect: Mood normal.     ED Results / Procedures / Treatments   Labs (all labs ordered are listed, but only abnormal results are displayed) Results for orders placed or performed during the hospital encounter of 06/07/23  CBC   Collection Time: 06/07/23  2:30 PM  Result Value Ref Range   WBC 8.1 4.0 - 10.5 K/uL   RBC 4.83 4.22 - 5.81 MIL/uL   Hemoglobin 15.8 13.0 - 17.0 g/dL   HCT 01.0 27.2 - 53.6 %   MCV 95.2 80.0 - 100.0 fL   MCH 32.7 26.0 - 34.0 pg   MCHC 34.3 30.0 - 36.0 g/dL   RDW  64.4 03.4 - 74.2 %   Platelets 206 150 - 400 K/uL   nRBC 0.0 0.0 - 0.2 %  Comprehensive metabolic panel with GFR   Collection Time: 06/07/23  2:30 PM  Result Value Ref Range   Sodium 139 135 - 145 mmol/L   Potassium 3.6 3.5 - 5.1 mmol/L   Chloride 103 98 - 111 mmol/L   CO2 24 22 - 32 mmol/L   Glucose, Bld 109 (H) 70 - 99 mg/dL   BUN 25 (H) 8 - 23 mg/dL   Creatinine, Ser 5.95 (H) 0.61 - 1.24 mg/dL   Calcium 9.8 8.9 - 63.8 mg/dL   Total Protein 7.8 6.5 - 8.1 g/dL   Albumin 4.3 3.5 - 5.0 g/dL   AST 23 15 - 41 U/L   ALT 16 0 - 44 U/L   Alkaline Phosphatase 60 38 - 126 U/L   Total Bilirubin 0.8 0.0 - 1.2 mg/dL   GFR, Estimated 56 (L) >60 mL/min   Anion gap 12 5 - 15  D-dimer, quantitative   Collection Time: 06/07/23  2:30 PM  Result Value Ref Range   D-Dimer, Quant 0.52 (H) 0.00 - 0.50 ug/mL-FEU     EKG EKG Interpretation Date/Time:  Wednesday June 07 2023 14:30:30 EDT Ventricular Rate:  92 PR Interval:  151 QRS Duration:  97 QT Interval:  347 QTC Calculation: 430 R Axis:   -76  Text Interpretation: Sinus rhythm Nonspecific T wave abnormality Confirmed by Guadalupe Lee ([id number]) on 06/07/2023 2:31:52 PM  Radiology DG Chest Port 1 View Result Date: 06/07/2023 CLINICAL DATA:  Shortness of breath. EXAM: PORTABLE CHEST 1 VIEW COMPARISON:  December 24, 2018. FINDINGS: The heart size and mediastinal contours are within normal limits. Both lungs are clear. The visualized skeletal structures are unremarkable. IMPRESSION: No active disease. Electronically Signed   By: Rosalene Colon M.D.   On: 06/07/2023 14:45    Procedures Procedures    Medications Ordered in ED Medications  albuterol  (PROVENTIL ) (2.5 MG/3ML) 0.083% nebulizer solution 5 mg (has no administration in time range)  ipratropium (ATROVENT) nebulizer solution 0.5 mg (has no administration in time range)  albuterol  (PROVENTIL ) (2.5 MG/3ML) 0.083% nebulizer solution 5 mg (5 mg Nebulization Given 06/07/23 1450)   ipratropium (ATROVENT) nebulizer solution 0.5 mg (0.5 mg Nebulization Given 06/07/23 1450)  methylPREDNISolone sodium succinate  (  SOLU-MEDROL) 125 mg/2 mL injection 125 mg (125 mg Intravenous Given 06/07/23 1451)    ED Course/ Medical Decision Making/ A&P                                 Medical Decision Making Problems Addressed: Acute respiratory failure with hypoxia [facility]): acute illness or injury with systemic symptoms that poses a threat to life or bodily functions SOB (shortness of breath): acute illness or injury Tobacco use disorder: chronic illness or injury with exacerbation, progression, or side effects of treatment that poses a threat to life or bodily functions Wheezing: acute illness or injury  Amount and/or Complexity of Data Reviewed External Data Reviewed: labs, radiology and notes. Labs: ordered. Decision-making details documented in ED Course. Radiology: ordered and independent interpretation performed. Decision-making details documented in ED Course. ECG/medicine tests: ordered and independent interpretation performed. Decision-making details documented in ED Course.  Risk Prescription drug management. Decision regarding hospitalization.   Iv ns. Continuous pulse ox and cardiac monitoring. Labs ordered/sent. Imaging ordered.   Differential diagnosis includes pna, chf, etc. Dispo decision including potential need for admission considered - will get labs and imaging and reassess.   Reviewed nursing notes and prior charts for additional history. External reports reviewed.  Cardiac monitor: sinus rhythm, rate 94.  Labs reviewed/interpreted by me - wbc and hgb normal. Chem unremarkable. Ddimer mildly high - will get ct.   Xrays reviewed/interpreted by me - no pna.  Albuterol  and atrovent neb. Solumedrol iv.   CT reviewed/interpreted by me - pnd.   Wheezing persists but improved.  Albuterol  and atrovent neb.   Given hypoxia, new o2 requirement, hospitalists  consulted for admission.  Discussed pt with hospitalists - will admit.   Ct study remains pending.  CRITICAL CARE RE : acute resp failure w hypoxia. Performed by: Tanijah Morais E Riyan Haile Total critical care time: 40 minutes Critical care time was exclusive of separately billable procedures and treating other patients. Critical care was necessary to treat or prevent imminent or life-threatening deterioration. Critical care was time spent personally by me on the following activities: development of treatment plan with patient and/or surrogate as well as nursing, discussions with consultants, evaluation of patient's response to treatment, examination of patient, obtaining history from patient or surrogate, ordering and performing treatments and interventions, ordering and review of laboratory studies, ordering and review of radiographic studies, pulse oximetry and re-evaluation of patient's condition.           Final Clinical Impression(s) / ED Diagnoses Final diagnoses:  Acute respiratory failure with hypoxia (HCC)  Wheezing  SOB (shortness of breath)  Tobacco use disorder    Rx / DC Orders ED Discharge Orders     None             Guadalupe Lee, MD 06/07/23 1652

## 2023-06-07 NOTE — H&P (Signed)
 History and Physical    Patient: Dennis [name] [id number]:[id number] DOB: 1950/09/08 DOA: 06/07/2023 DOS: the patient was seen and examined on 06/07/2023 PCP: Dudley Ghee, MD  Patient coming from: Home  Chief Complaint:  Chief Complaint  Patient presents with   Shortness of Breath   HPI: Dennis [name] is a 73 y.o. male with medical history significant of hypertension, dyslipidemia.  Patient reports that with his usual levels of activities he never has any shortness of breath.  Patient reports that she had a new onset of dry cough with sensation of shortness of breath while walking that started about 3 days ago which was progressive.  He denies having any fever palpitation chest pain or any expectoration or any leg swelling.  Patient went to the primary care provider today because of complaints as above and found to be, per report, hypoxic to SpO2 85% on room air.  Patient is sent to [facility], ER.  Here patient is found to have wheezes diffusely on exam, patient has been treated with inhaled bronchodilator therapy as well as Solu-Medrol.  Medical evaluation is sought.  Patient has been stabilized on 2 L/min of supplementary oxygen and at this time while in the stretcher is completely asymptomatic.   Review of Systems: As mentioned in the history of present illness. All other systems reviewed and are negative. 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 He has never used smokeless tobacco. He reports current alcohol use of about 2.0 standard drinks of alcohol per week. He reports that he does not use drugs.  No Known Allergies  History reviewed. No pertinent family history.  Prior to Admission medications   Medication Sig Start Date End Date Taking? Authorizing  Provider  amLODipine (NORVASC) 5 MG tablet Take 5 mg by mouth daily. 05/14/23  Yes [provider]  Cholecalciferol (VITAMIN D3) 1000 units CAPS Take 1,000 Units by mouth in the morning.   Yes [provider]  rosuvastatin (CRESTOR) 20 MG tablet Take 20 mg by mouth daily. 05/14/23  Yes [provider]    Physical Exam: Vitals:   06/07/23 1515 06/07/23 1738 06/07/23 1745 06/07/23 1758  BP: 135/87 (!) 140/78 (!) 145/87 (!) 163/95  Pulse: 73 80 77 79  Resp: (!) 23 20 (!) 21 20  Temp:  98.4 F (36.9 C)  98.1 F (36.7 C)  TempSrc:  Oral  Oral  SpO2: 94% 98% 95%   Weight:    85.1 kg  Height:    5\' 11"  (1.803 m)   Neuro: Patient is alert and awake, appears to be in no distress Respiratory exam: Diffusely diminished breath sounds with occasional expiratory wheeze, this exam is better compared to that reported to me by the ER provider Cardiovascular exam S1-S2 normal Abdomen all quadrants soft nontender Extremities warm without edema. Data Reviewed:  Labs on Admission:  Results for orders placed or performed during the hospital encounter of 06/07/23 (from the past 24 hours)  CBC     Status: None   Collection Time: 06/07/23  2:30 PM  Result Value Ref Range   WBC 8.1 4.0 - 10.5 K/uL   RBC 4.83 4.22 - 5.81 MIL/uL   Hemoglobin 15.8 13.0 - 17.0 g/dL   HCT 46.0  39.0 - 52.0 %   MCV 95.2 80.0 - 100.0 fL   MCH 32.7 26.0 - 34.0 pg   MCHC 34.3 30.0 - 36.0 g/dL   RDW 82.9 56.2 - 13.0 %   Platelets 206 150 - 400 K/uL   nRBC 0.0 0.0 - 0.2 %  Comprehensive metabolic panel with GFR     Status: Abnormal   Collection Time: 06/07/23  2:30 PM  Result Value Ref Range   Sodium 139 135 - 145 mmol/L   Potassium 3.6 3.5 - 5.1 mmol/L   Chloride 103 98 - 111 mmol/L   CO2 24 22 - 32 mmol/L   Glucose, Bld 109 (H) 70 - 99 mg/dL   BUN 25 (H) 8 - 23 mg/dL   Creatinine, Ser 8.65 (H) 0.61 - 1.24 mg/dL   Calcium 9.8 8.9 - 78.4 mg/dL   Total Protein 7.8 6.5 - 8.1 g/dL   Albumin 4.3  3.5 - 5.0 g/dL   AST 23 15 - 41 U/L   ALT 16 0 - 44 U/L   Alkaline Phosphatase 60 38 - 126 U/L   Total Bilirubin 0.8 0.0 - 1.2 mg/dL   GFR, Estimated 56 (L) >60 mL/min   Anion gap 12 5 - 15  Brain natriuretic peptide     Status: Abnormal   Collection Time: 06/07/23  2:30 PM  Result Value Ref Range   B Natriuretic Peptide 156.6 (H) 0.0 - 100.0 pg/mL  D-dimer, quantitative     Status: Abnormal   Collection Time: 06/07/23  2:30 PM  Result Value Ref Range   D-Dimer, Quant 0.52 (H) 0.00 - 0.50 ug/mL-FEU  Resp panel by RT-PCR (RSV, Flu A&B, Covid) Anterior Nasal Swab     Status: None   Collection Time: 06/07/23  2:50 PM   Specimen: Anterior Nasal Swab  Result Value Ref Range   SARS Coronavirus 2 by RT PCR NEGATIVE NEGATIVE   Influenza A by PCR NEGATIVE NEGATIVE   Influenza B by PCR NEGATIVE NEGATIVE   Resp Syncytial Virus by PCR NEGATIVE NEGATIVE   Basic Metabolic Panel: Recent Labs  Lab 06/07/23 1430  NA 139  K 3.6  CL 103  CO2 24  GLUCOSE 109*  BUN 25*  CREATININE 1.35*  CALCIUM 9.8   Liver Function Tests: Recent Labs  Lab 06/07/23 1430  AST 23  ALT 16  ALKPHOS 60  BILITOT 0.8  PROT 7.8  ALBUMIN 4.3   No results for input(s): "LIPASE", "AMYLASE" in the last 168 hours. No results for input(s): "AMMONIA" in the last 168 hours. CBC: Recent Labs  Lab 06/07/23 1430  WBC 8.1  HGB 15.8  HCT 46.0  MCV 95.2  PLT 206   Cardiac Enzymes: No results for input(s): "CKTOTAL", "CKMB", "CKMBINDEX", "TROPONINIHS" in the last 168 hours.  BNP (last 3 results) No results for input(s): "PROBNP" in the last 8760 hours. CBG: No results for input(s): "GLUCAP" in the last 168 hours.  Radiological Exams on Admission:  CT Angio Chest PE W/Cm &/Or Wo Cm Result Date: 06/07/2023 CLINICAL DATA:  Pulmonary embolus suspected with low to intermediate probability. Positive D-dimer. Shortness of breath. Possible COVID. EXAM: CT ANGIOGRAPHY CHEST WITH CONTRAST TECHNIQUE: Multidetector CT  imaging of the chest was performed using the standard protocol during bolus administration of intravenous contrast. Multiplanar CT image reconstructions and MIPs were obtained to evaluate the vascular anatomy. RADIATION DOSE REDUCTION: This exam was performed according to the departmental dose-optimization program which includes automated exposure control, adjustment of the mA  and/or kV according to patient size and/or use of iterative reconstruction technique. CONTRAST:  75mL OMNIPAQUE  IOHEXOL  350 MG/ML SOLN COMPARISON:  Chest radiograph 06/07/2023. PET-CT 12/20/2011. CT chest 01/30/2011 FINDINGS: Cardiovascular: Technically adequate study with good opacification of the central and segmental pulmonary arteries. Mild motion artifact. No focal filling defects are demonstrated. No evidence of significant pulmonary embolus. Normal heart size. No pericardial effusions. Normal caliber thoracic aorta. No aortic dissection. Mild calcification in the aorta and coronary arteries. Great vessel origins are patent. Mediastinum/Nodes: Thyroid gland is unremarkable. No significant lymphadenopathy. Esophagus is decompressed. Lungs/Pleura: Emphysematous changes in the lungs. No airspace disease or consolidation. No pleural effusion or pneumothorax. Upper Abdomen: Surgical clips in the pericaval and periaortic retroperitoneum. Scarring in the right kidney. Intrarenal stone in the right kidney measuring 4 mm diameter. No hydronephrosis is seen. Musculoskeletal: Degenerative changes in the spine. Old rib fractures. No acute bony abnormalities. Review of the MIP images confirms the above findings. IMPRESSION: 1. No evidence of significant pulmonary embolus. 2. Emphysematous changes in the lungs. No evidence of active pulmonary disease. 3. Aortic atherosclerosis. 4. Nonobstructing intrarenal stone in the right kidney. Electronically Signed   By: Boyce Byes M.D.   On: 06/07/2023 18:00   DG Chest Port 1 View Result Date:  06/07/2023 CLINICAL DATA:  Shortness of breath. EXAM: PORTABLE CHEST 1 VIEW COMPARISON:  December 24, 2018. FINDINGS: The heart size and mediastinal contours are within normal limits. Both lungs are clear. The visualized skeletal structures are unremarkable. IMPRESSION: No active disease. Electronically Signed   By: Rosalene Colon M.D.   On: 06/07/2023 14:45      No intake/output data recorded. No intake/output data recorded.     Assessment and Plan: * COPD exacerbation (HCC) Differential includes asthma exacerbation.  Although CAT scan is showing emphysematous changes.  No evidence of pneumonia on CAT scan.  RT-PCR is negative, no fever or leukocytosis.  Therefore at this time will treat with Solu-Medrol 60 mg daily, starting tomorrow with slow taper.  DuoNebs ordered.  I will also order inhaled corticosteroids and long-acting beta adrenergic agents.  Smoking cessation as below.  Will defer antibiotics.  GI prophylaxis with pantoprazole   Patient has associated acute hypoxemic respiratory failure, supplemental oxygen as needed.  HTN (hypertension) Continue with amlodipine  Dyslipidemia Continue with Crestor  CKD (chronic kidney disease) Outpatient work up for this . S/p contrast today. Will give some fluids tonight.  Tobacco abuse Discussed benefits of smoking cessation including improvement in respiratory status/nonprogression of COPD.  Further cancer risk reduction, reduction in cardiovascular risk and stroke.  Patient receptive.  Will reinforce with the literature to be provided by RN.  Will order nicotine replacement therapy.   DVT ppx lovenox     Advance Care Planning:   Code Status: Full Code   Consults: none  Family Communication: dad at bedside. All quesiotns answered.  Discussed risk of leaving the hospital at this time given acute hypoxic respiratory failure.  With the concern of patient passing out having trauma/delirium and associated complications.  Severity of  Illness: The appropriate patient status for this patient is INPATIENT. Inpatient status is judged to be reasonable and necessary in order to provide the required intensity of service to ensure the patient's safety. The patient's presenting symptoms, physical exam findings, and initial radiographic and laboratory data in the context of their chronic comorbidities is felt to place them at high risk for further clinical deterioration. Furthermore, it is not anticipated that the patient will be medically  stable for discharge from the hospital within 2 midnights of admission.   * I certify that at the point of admission it is my clinical judgment that the patient will require inpatient hospital care spanning beyond 2 midnights from the point of admission due to high intensity of service, high risk for further deterioration and high frequency of surveillance required.*  Author: Bennie Brave, MD 06/07/2023 6:03 PM  For on call review [email or web address].

## 2023-06-07 NOTE — ED Notes (Signed)
 Transported to CT scan per stretcher.

## 2023-06-07 NOTE — Assessment & Plan Note (Addendum)
 Outpatient work up for this . S/p contrast today. Will give some fluids tonight.

## 2023-06-07 NOTE — ED Triage Notes (Signed)
 Pt arrives POV with c/o shortness of breath. Pt reports that he went to his PCP worried that he had covid because he has been short of breath and achy. Upon arrival to his PCP, pt was noted to by hypoxic and sent to ED and was told he had fluid in his lungs. Pt was 86% on RA upon arrival to triage.

## 2023-06-07 NOTE — Assessment & Plan Note (Signed)
 Differential includes asthma exacerbation.  Although CAT scan is showing emphysematous changes.  No evidence of pneumonia on CAT scan.  RT-PCR is negative, no fever or leukocytosis.  Therefore at this time will treat with Solu-Medrol 60 mg daily, starting tomorrow with slow taper.  DuoNebs ordered.  I will also order inhaled corticosteroids and long-acting beta adrenergic agents.  Smoking cessation as below.  Will defer antibiotics.  GI prophylaxis with pantoprazole   Patient has associated acute hypoxemic respiratory failure, supplemental oxygen as needed.

## 2023-06-07 NOTE — Assessment & Plan Note (Signed)
 Discussed benefits of smoking cessation including improvement in respiratory status/nonprogression of COPD.  Further cancer risk reduction, reduction in cardiovascular risk and stroke.  Patient receptive.  Will reinforce with the literature to be provided by RN.  Will order nicotine replacement therapy.

## 2023-06-07 NOTE — Assessment & Plan Note (Signed)
Continue with amlodipine

## 2023-06-07 NOTE — Assessment & Plan Note (Signed)
 Continue with Crestor

## 2023-06-08 DIAGNOSIS — J441 Chronic obstructive pulmonary disease with (acute) exacerbation: Secondary | ICD-10-CM | POA: Diagnosis not present

## 2023-06-08 LAB — CBC
HCT: 44.1 % (ref 39.0–52.0)
Hemoglobin: 14.1 g/dL (ref 13.0–17.0)
MCH: 32 pg (ref 26.0–34.0)
MCHC: 32 g/dL (ref 30.0–36.0)
MCV: 100.2 fL — ABNORMAL HIGH (ref 80.0–100.0)
Platelets: 173 10*3/uL (ref 150–400)
RBC: 4.4 MIL/uL (ref 4.22–5.81)
RDW: 12.7 % (ref 11.5–15.5)
WBC: 7.9 10*3/uL (ref 4.0–10.5)
nRBC: 0 % (ref 0.0–0.2)

## 2023-06-08 LAB — BASIC METABOLIC PANEL WITH GFR
Anion gap: 11 (ref 5–15)
BUN: 25 mg/dL — ABNORMAL HIGH (ref 8–23)
CO2: 23 mmol/L (ref 22–32)
Calcium: 9.4 mg/dL (ref 8.9–10.3)
Chloride: 101 mmol/L (ref 98–111)
Creatinine, Ser: 1.07 mg/dL (ref 0.61–1.24)
GFR, Estimated: >60 mL/min (ref >60)
Glucose, Bld: 136 mg/dL — ABNORMAL HIGH (ref 70–99)
Potassium: 3.9 mmol/L (ref 3.5–5.1)
Sodium: 135 mmol/L (ref 135–145)

## 2023-06-08 LAB — PROTIME-INR
INR: 1 (ref 0.8–1.2)
Prothrombin Time: 13.2 s (ref 11.4–15.2)

## 2023-06-08 LAB — APTT: aPTT: 26 s (ref 24–36)

## 2023-06-08 MED ORDER — GUAIFENESIN ER 600 MG PO TB12
600.0000 mg | ORAL_TABLET | Freq: Two times a day (BID) | ORAL | Status: DC
  Administered 2023-06-08 – 2023-06-09 (×3): 600 mg via ORAL
  Filled 2023-06-08 (×3): qty 1

## 2023-06-08 NOTE — Progress Notes (Signed)
   06/08/23 1517  TOC Brief Assessment  Insurance and Status Reviewed  Patient has primary care physician Yes Adelene Homer, Currie Douse, MD)  Home environment has been reviewed From home  Prior level of function: Independent  Prior/Current Home Services No current home services  Social Drivers of Health Review SDOH reviewed no interventions necessary  Readmission risk has been reviewed Yes  Transition of care needs no transition of care needs at this time

## 2023-06-08 NOTE — Plan of Care (Signed)

## 2023-06-08 NOTE — Progress Notes (Signed)
 Upon rounding, pt was witnessed getting dressed. Upon assessment, pt removed is PIV which was infusing LR. Pt answered all orientation question appropriately VSS and stated he was ready to leave but was waiting for the doctor to round. Writer explained to pt that they do not round this early. Pt agreeable to re-insert PIV and continue IVF until doctor rounds.

## 2023-06-08 NOTE — Progress Notes (Signed)
 Pt ambulated about 263ft on room air. No complaints while ambulating. Pt dropped to 90% while talking and walking at the same time but returned to 94% once just walking. Pt recovered well.   Patient Saturations on Room Air at Rest = 92%  Patient Saturations on [employer] while Ambulating = 90-95%

## 2023-06-08 NOTE — Progress Notes (Signed)
 PROGRESS NOTE  Dennis [name]  DOB: February 02, 1951  PCP: Dudley Ghee, MD OZD:[id number]  DOA: 06/07/2023  LOS: 1 day  Hospital Day: 2  Brief narrative: Dennis [name] is a 73 y.o. male with PMH significant for HTN, HLD, CKD, chronic 1 pack/day smoker and no known history of COPD 4/30, patient was seen at PCPs office for 3-day history of cough, shortness of breath.  Found to be hypoxic at 85% on room air and hence sent to the ED In the ED, afebrile, hemodynamically stable, O2 sat more than 90% on 2 L She had diffuse bilateral wheezing which improved with IV Solu-Medrol  Labs with WC count normal, BUN/creatinine 25/1.35 Respiratory virus panel unremarkable  chest x-ray normal CT angio chest negative for PE but showed emphysematous changes in both lungs Admitted to [facility] for COPD exacerbation  Subjective: Patient was seen and examined this morning.  Pleasant elderly Caucasian male.  Propped up in bed.  Not in distress.  Was still wheezing on auscultation. Cough improving. Chart reviewed Remains afebrile and hemodynamically stable Labs with normal WBC count, BUN/creatinine 25/1.07  Assessment and plan: Acute COPD exacerbation Acute respiratory failure with hypoxia Presented with 3 days of shortness of breath, cough, hypoxia and diffuse wheezing no prior diagnosis of COPD but CT scan showed emphysematous changes.   Imaging without infiltrates Currently on IV Solu-Medrol  60 mg daily. Continues to have wheezing.  I would continue IV Solu-Medrol  for 1 more day and hopefully plan to discharge home tomorrow.   Continue bronchodilators, Mucinex  twice daily and as needed  Chronic smoking Smokes 1 pack/day.  Counseled to quit Nicotine  patch offered   HTN Continue amlodipine    Dyslipidemia Continue Crestor    CKD 3a Creatinine stable.      Mobility: Encourage ambulation  Goals of care   Code Status: Full Code     DVT prophylaxis:  enoxaparin  (LOVENOX ) injection 40 mg  Start: 06/08/23 0800 SCDs Start: 06/07/23 1640   Antimicrobials: None Fluid: None Consultants: None Family Communication: None at bedside  Status: Inpatient Level of care:  Med-Surg   Patient is from: Home Needs to continue in-hospital care: Continue to have wheezing bilateral, needs IV steroids Anticipated d/c to: Hopefully home tomorrow      Diet:  Diet Order             Diet regular Room service appropriate? Yes; Fluid consistency: Thin  Diet effective now                   Scheduled Meds:  amLODipine   5 mg Oral Daily   cholecalciferol   1,000 Units Oral Daily   enoxaparin  (LOVENOX ) injection  40 mg Subcutaneous Q24H   fluticasone  furoate-vilanterol  1 puff Inhalation Daily   guaiFENesin   600 mg Oral BID   ipratropium-albuterol   3 mL Nebulization TID   methylPREDNISolone  sodium succinate   60 mg Intravenous Q24H   pantoprazole   20 mg Oral Daily   rosuvastatin   20 mg Oral Daily   sodium chloride  flush  3 mL Intravenous Q12H    PRN meds: acetaminophen  **OR** acetaminophen , guaiFENesin -dextromethorphan , nicotine  polacrilex, polyethylene glycol   Infusions:    Antimicrobials: Anti-infectives (From admission, onward)    None       Objective: Vitals:   06/08/23 0521 06/08/23 0837  BP: (!) 141/84   Pulse: 73   Resp: 20   Temp: 98 F (36.7 C)   SpO2: 95% 98%    Intake/Output Summary (Last 24 hours) at 06/08/2023 1251 Last data filed  at 06/08/2023 0913 Gross per 24 hour  Intake 919.58 ml  Output --  Net 919.58 ml   Filed Weights   06/07/23 1758  Weight: 85.1 kg   Weight change:  Body mass index is 26.17 kg/m.   Physical Exam: General exam: Pleasant, elderly Caucasian male.  Not in pain Skin: No rashes, lesions or ulcers. HEENT: Atraumatic, normocephalic, no obvious bleeding Lungs: Diffuse bilateral wheezing CVS: S1, S2, no murmur,   GI/Abd: Soft, nontender, nondistended, bowel sound present,   CNS: Alert, awake, oriented x 3 Psychiatry:  Mood appropriate,  Extremities: No pedal edema, no calf tenderness,   Data Review: I have personally reviewed the laboratory data and studies available.  F/u labs ordered Unresulted Labs (From admission, onward)     Start     Ordered   06/14/23 0500  Creatinine, serum  (enoxaparin  (LOVENOX )    CrCl >/= 30 ml/min)  Weekly,   R     Comments: while on enoxaparin  therapy    06/07/23 1640            Signed, Hoyt Macleod, MD [facility] 06/08/2023

## 2023-06-08 NOTE — Plan of Care (Signed)

## 2023-06-09 DIAGNOSIS — J441 Chronic obstructive pulmonary disease with (acute) exacerbation: Secondary | ICD-10-CM | POA: Diagnosis not present

## 2023-06-09 MED ORDER — PREDNISONE 10 MG PO TABS: ORAL_TABLET | ORAL | 0 refills | Status: AC

## 2023-06-09 MED ORDER — ALBUTEROL SULFATE HFA 108 (90 BASE) MCG/ACT IN AERS: 2.0000 | INHALATION_SPRAY | Freq: Four times a day (QID) | RESPIRATORY_TRACT | 2 refills | Status: AC | PRN

## 2023-06-09 MED ORDER — GUAIFENESIN ER 600 MG PO TB12: 600.0000 mg | ORAL_TABLET | Freq: Two times a day (BID) | ORAL | 0 refills | Status: AC

## 2023-06-09 NOTE — Progress Notes (Signed)
 Pt walking test this morning was good and tolerated well. Pt ambulated to end of hall and back without concerns.  Patient Saturations on Room Air at Rest = 98%  Patient Saturations on [employer] while Ambulating = 95%

## 2023-06-09 NOTE — Discharge Summary (Signed)
 Physician Discharge Summary  Dennis [name] WUJ:[id number] DOB: Sep 08, 1950 DOA: 06/07/2023  PCP: Dudley Ghee, MD  Admit date: 06/07/2023 Discharge date: 06/09/2023  Admitted From: Home Discharge disposition: Home  Recommendations at discharge:  Complete tapering course of prednisone   stop smoking Use rescue albuterol  inhaler as needed Follow-up with PCP as an outpatient  Brief narrative: Dennis [name] is a 73 y.o. male with PMH significant for HTN, HLD, CKD, chronic 1 pack/day smoker and no known history of COPD 4/30, patient was seen at PCPs office for 3-day history of cough, shortness of breath.  Found to be hypoxic at 85% on room air and hence sent to the ED In the ED, afebrile, hemodynamically stable, O2 sat more than 90% on 2 L She had diffuse bilateral wheezing which improved with IV Solu-Medrol  Labs with WC count normal, BUN/creatinine 25/1.35 Respiratory virus panel unremarkable  chest x-ray normal CT angio chest negative for PE but showed emphysematous changes in both lungs Admitted to [facility] for COPD exacerbation  Subjective: Patient was seen and examined this morning.  Lying on the bed not in distress.  Not on supplemental oxygen.  Able to ambulate without supplemental oxygen  Hospital course: Acute COPD exacerbation Acute respiratory failure with hypoxia Presented with 3 days of shortness of breath, cough, hypoxia and diffuse wheezing no prior diagnosis of COPD but CT scan showed emphysematous changes.   Imaging without infiltrates Patient was treated with IV Solu-Medrol  60 mg daily. Feels better.  Sounds better.  Plan to discharge today on oral prednisone .  Continue Mucinex  per 7 days.  Continue albuterol  as needed.  Chronic smoking Smokes 1 pack/day.  Counseled to quit Nicotine  patch offered   HTN Continue amlodipine    Dyslipidemia Continue Crestor    CKD 3a Creatinine stable.    Goals of care   Code Status: Prior   Diet:  Diet Order              Diet general                   Nutritional status:  Body mass index is 26.17 kg/m.       Wounds:  -    Discharge Exam:   Vitals:   06/09/23 0358 06/09/23 0747 06/09/23 0752 06/09/23 0810  BP: 127/75     Pulse: 69     Resp: 18     Temp: 98.1 F (36.7 C)     TempSrc: Oral     SpO2: 95% 96% 97% 98%  Weight:      Height:        Body mass index is 26.17 kg/m.   General exam: Pleasant, elderly Caucasian male.  Not in pain Skin: No rashes, lesions or ulcers. HEENT: Atraumatic, normocephalic, no obvious bleeding Lungs: Mild end expiratory wheezing bilaterally but better than yesterday CVS: S1, S2, no murmur,   GI/Abd: Soft, nontender, nondistended, bowel sound present,   CNS: Alert, awake, oriented x 3 Psychiatry: Mood appropriate,  Extremities: No pedal edema, no calf tenderness,   Follow ups:    Follow-up Information     Dudley Ghee, MD Follow up.   Specialty: Internal Medicine Contact information: [address] [place] [address] [phone]                 Discharge Instructions:   Discharge Instructions     Call MD for:  difficulty breathing, headache or visual disturbances   Complete by: As directed    Call MD for:  extreme fatigue   Complete by: As directed    Call MD for:  hives   Complete by: As directed    Call MD for:  persistant dizziness or light-headedness   Complete by: As directed    Call MD for:  persistant nausea and vomiting   Complete by: As directed    Call MD for:  severe uncontrolled pain   Complete by: As directed    Call MD for:  temperature >100.4   Complete by: As directed    Diet general   Complete by: As directed    Discharge instructions   Complete by: As directed    Recommendations at discharge:   Complete tapering course of prednisone    stop smoking  Use rescue albuterol  inhaler as needed  Follow-up with PCP as an outpatient  General discharge instructions: Follow with  Primary MD Dudley Ghee, MD in 7 days  Please request your PCP  to go over your hospital tests, procedures, radiology results at the follow up. Please get your medicines reviewed and adjusted.  Your PCP may decide to repeat certain labs or tests as needed. Do not drive, operate heavy machinery, perform activities at heights, swimming or participation in water activities or provide baby sitting services if your were admitted for syncope or siezures until you have seen by Primary MD or a Neurologist and advised to do so again. [place]  Controlled Substance Reporting System database was reviewed. Do not drive, operate heavy machinery, perform activities at heights, swim, participate in water activities or provide baby-sitting services while on medications for pain, sleep and mood until your outpatient physician has reevaluated you and advised to do so again.  You are strongly recommended to comply with the dose, frequency and duration of prescribed medications. Activity: As tolerated with Full fall precautions use walker/cane & assistance as needed Avoid using any recreational substances like cigarette, tobacco, alcohol, or non-prescribed drug. If you experience worsening of your admission symptoms, develop shortness of breath, life threatening emergency, suicidal or homicidal thoughts you must seek medical attention immediately by calling 911 or calling your MD immediately  if symptoms less severe. You must read complete instructions/literature along with all the possible adverse reactions/side effects for all the medicines you take and that have been prescribed to you. Take any new medicine only after you have completely understood and accepted all the possible adverse reactions/side effects.  Wear Seat belts while driving. You were cared for by a hospitalist during your hospital stay. If you have any questions about your discharge medications or the care you received while you were in the hospital  after you are discharged, you can call the unit and ask to speak with the hospitalist or the covering physician. Once you are discharged, your primary care physician will handle any further medical issues. Please note that NO REFILLS for any discharge medications will be authorized once you are discharged, as it is imperative that you return to your primary care physician (or establish a relationship with a primary care physician if you do not have one).   Increase activity slowly   Complete by: As directed        Discharge Medications:   Allergies as of 06/09/2023   No Known Allergies      Medication List     TAKE these medications    albuterol  108 (90 Base) MCG/ACT inhaler Commonly known as: VENTOLIN  HFA Inhale 2 puffs into the lungs every 6 (six) hours as needed for wheezing or  shortness of breath.   amLODipine  5 MG tablet Commonly known as: NORVASC  Take 5 mg by mouth daily.   guaiFENesin  600 MG 12 hr tablet Commonly known as: MUCINEX  Take 1 tablet (600 mg total) by mouth 2 (two) times daily for 7 days.   predniSONE  10 MG tablet Commonly known as: DELTASONE  Take 4 tablets daily X 2 days, then, Take 3 tablets daily X 2 days, then, Take 2 tablets daily X 2 days, then, Take 1 tablets daily X 1 day.   rosuvastatin  20 MG tablet Commonly known as: CRESTOR  Take 20 mg by mouth daily.   Vitamin D3 1000 units Caps Take 1,000 Units by mouth in the morning.         The results of significant diagnostics from this hospitalization (including imaging, microbiology, ancillary and laboratory) are listed below for reference.    Procedures and Diagnostic Studies:   CT Angio Chest PE W/Cm &/Or Wo Cm Result Date: 06/07/2023 CLINICAL DATA:  Pulmonary embolus suspected with low to intermediate probability. Positive D-dimer. Shortness of breath. Possible COVID. EXAM: CT ANGIOGRAPHY CHEST WITH CONTRAST TECHNIQUE: Multidetector CT imaging of the chest was performed using the standard  protocol during bolus administration of intravenous contrast. Multiplanar CT image reconstructions and MIPs were obtained to evaluate the vascular anatomy. RADIATION DOSE REDUCTION: This exam was performed according to the departmental dose-optimization program which includes automated exposure control, adjustment of the mA and/or kV according to patient size and/or use of iterative reconstruction technique. CONTRAST:  75mL OMNIPAQUE  IOHEXOL  350 MG/ML SOLN COMPARISON:  Chest radiograph 06/07/2023. PET-CT 12/20/2011. CT chest 01/30/2011 FINDINGS: Cardiovascular: Technically adequate study with good opacification of the central and segmental pulmonary arteries. Mild motion artifact. No focal filling defects are demonstrated. No evidence of significant pulmonary embolus. Normal heart size. No pericardial effusions. Normal caliber thoracic aorta. No aortic dissection. Mild calcification in the aorta and coronary arteries. Great vessel origins are patent. Mediastinum/Nodes: Thyroid gland is unremarkable. No significant lymphadenopathy. Esophagus is decompressed. Lungs/Pleura: Emphysematous changes in the lungs. No airspace disease or consolidation. No pleural effusion or pneumothorax. Upper Abdomen: Surgical clips in the pericaval and periaortic retroperitoneum. Scarring in the right kidney. Intrarenal stone in the right kidney measuring 4 mm diameter. No hydronephrosis is seen. Musculoskeletal: Degenerative changes in the spine. Old rib fractures. No acute bony abnormalities. Review of the MIP images confirms the above findings. IMPRESSION: 1. No evidence of significant pulmonary embolus. 2. Emphysematous changes in the lungs. No evidence of active pulmonary disease. 3. Aortic atherosclerosis. 4. Nonobstructing intrarenal stone in the right kidney. Electronically Signed   By: Boyce Byes M.D.   On: 06/07/2023 18:00   DG Chest Port 1 View Result Date: 06/07/2023 CLINICAL DATA:  Shortness of breath. EXAM: PORTABLE  CHEST 1 VIEW COMPARISON:  December 24, 2018. FINDINGS: The heart size and mediastinal contours are within normal limits. Both lungs are clear. The visualized skeletal structures are unremarkable. IMPRESSION: No active disease. Electronically Signed   By: Rosalene Colon M.D.   On: 06/07/2023 14:45     Labs:   Basic Metabolic Panel: Recent Labs  Lab 06/07/23 1430 06/08/23 0636  NA 139 135  K 3.6 3.9  CL 103 101  CO2 24 23  GLUCOSE 109* 136*  BUN 25* 25*  CREATININE 1.35* 1.07  CALCIUM  9.8 9.4   GFR Estimated Creatinine Clearance: 66.5 mL/min (by C-G formula based on SCr of 1.07 mg/dL). Liver Function Tests: Recent Labs  Lab 06/07/23 1430  AST 23  ALT 16  ALKPHOS 60  BILITOT 0.8  PROT 7.8  ALBUMIN 4.3   No results for input(s): "LIPASE", "AMYLASE" in the last 168 hours. No results for input(s): "AMMONIA" in the last 168 hours. Coagulation profile Recent Labs  Lab 06/08/23 0636  INR 1.0    CBC: Recent Labs  Lab 06/07/23 1430 06/08/23 0636  WBC 8.1 7.9  HGB 15.8 14.1  HCT 46.0 44.1  MCV 95.2 100.2*  PLT 206 173   Cardiac Enzymes: No results for input(s): "CKTOTAL", "CKMB", "CKMBINDEX", "TROPONINI" in the last 168 hours. BNP: Invalid input(s): "POCBNP" CBG: No results for input(s): "GLUCAP" in the last 168 hours. D-Dimer Recent Labs    06/07/23 1430  DDIMER 0.52*   Hgb A1c No results for input(s): "HGBA1C" in the last 72 hours. Lipid Profile No results for input(s): "CHOL", "HDL", "LDLCALC", "TRIG", "CHOLHDL", "LDLDIRECT" in the last 72 hours. Thyroid function studies No results for input(s): "TSH", "T4TOTAL", "T3FREE", "THYROIDAB" in the last 72 hours.  Invalid input(s): "FREET3" Anemia work up No results for input(s): "VITAMINB12", "FOLATE", "FERRITIN", "TIBC", "IRON", "RETICCTPCT" in the last 72 hours. Microbiology Recent Results (from the past 240 hours)  Resp panel by RT-PCR (RSV, Flu A&B, Covid) Anterior Nasal Swab     Status: None    Collection Time: 06/07/23  2:50 PM   Specimen: Anterior Nasal Swab  Result Value Ref Range Status   SARS Coronavirus 2 by RT PCR NEGATIVE NEGATIVE Final    Comment: (NOTE) SARS-CoV-2 target nucleic acids are NOT DETECTED.  The SARS-CoV-2 RNA is generally detectable in upper respiratory specimens during the acute phase of infection. The lowest concentration of SARS-CoV-2 viral copies this assay can detect is 138 copies/mL. A negative result does not preclude SARS-Cov-2 infection and should not be used as the sole basis for treatment or other patient management decisions. A negative result may occur with  improper specimen collection/handling, submission of specimen other than nasopharyngeal swab, presence of viral mutation(s) within the areas targeted by this assay, and inadequate number of viral copies(<138 copies/mL). A negative result must be combined with clinical observations, patient history, and epidemiological information. The expected result is Negative.  Fact Sheet for Patients:  BloggerCourse.com  Fact Sheet for Healthcare Providers:  SeriousBroker.it  This test is no t yet approved or cleared by the [place]  FDA and  has been authorized for detection and/or diagnosis of SARS-CoV-2 by FDA under an Emergency Use Authorization (EUA). This EUA will remain  in effect (meaning this test can be used) for the duration of the COVID-19 declaration under Section 564(b)(1) of the Act, 21 U.S.C.section 360bbb-3(b)(1), unless the authorization is terminated  or revoked sooner.       Influenza A by PCR NEGATIVE NEGATIVE Final   Influenza B by PCR NEGATIVE NEGATIVE Final    Comment: (NOTE) The Xpert Xpress SARS-CoV-2/FLU/RSV plus assay is intended as an aid in the diagnosis of influenza from Nasopharyngeal swab specimens and should not be used as a sole basis for treatment. Nasal washings and aspirates are unacceptable for  Xpert Xpress SARS-CoV-2/FLU/RSV testing.  Fact Sheet for Patients: BloggerCourse.com  Fact Sheet for Healthcare Providers: SeriousBroker.it  This test is not yet approved or cleared by the [place]  FDA and has been authorized for detection and/or diagnosis of SARS-CoV-2 by FDA under an Emergency Use Authorization (EUA). This EUA will remain in effect (meaning this test can be used) for the duration of the COVID-19 declaration under Section 564(b)(1) of the Act, 21 U.S.C.  section 360bbb-3(b)(1), unless the authorization is terminated or revoked.     Resp Syncytial Virus by PCR NEGATIVE NEGATIVE Final    Comment: (NOTE) Fact Sheet for Patients: BloggerCourse.com  Fact Sheet for Healthcare Providers: SeriousBroker.it  This test is not yet approved or cleared by the [place]  FDA and has been authorized for detection and/or diagnosis of SARS-CoV-2 by FDA under an Emergency Use Authorization (EUA). This EUA will remain in effect (meaning this test can be used) for the duration of the COVID-19 declaration under Section 564(b)(1) of the Act, 21 U.S.C. section 360bbb-3(b)(1), unless the authorization is terminated or revoked.  Performed at [facility], [address] [place], [place] [address]     Time coordinating discharge: 45 minutes  Signed: Aminta Baldy Anique Beckley  [facility] 06/09/2023, 3:42 PM
